# Patient Record
Sex: Male | Born: 1935
Health system: Southern US, Community
[De-identification: ages and names within clinical notes are randomized; demographics above are authoritative.]

## PROBLEM LIST (undated history)

## (undated) DIAGNOSIS — N210 Calculus in bladder: Secondary | ICD-10-CM

## (undated) DIAGNOSIS — R972 Elevated prostate specific antigen [PSA]: Secondary | ICD-10-CM

## (undated) DIAGNOSIS — M1990 Unspecified osteoarthritis, unspecified site: Secondary | ICD-10-CM

## (undated) DIAGNOSIS — I6529 Occlusion and stenosis of unspecified carotid artery: Secondary | ICD-10-CM

## (undated) DIAGNOSIS — R310 Gross hematuria: Secondary | ICD-10-CM

## (undated) DIAGNOSIS — I2510 Atherosclerotic heart disease of native coronary artery without angina pectoris: Secondary | ICD-10-CM

## (undated) DIAGNOSIS — N401 Enlarged prostate with lower urinary tract symptoms: Secondary | ICD-10-CM

## (undated) DIAGNOSIS — I499 Cardiac arrhythmia, unspecified: Secondary | ICD-10-CM

## (undated) DIAGNOSIS — E785 Hyperlipidemia, unspecified: Secondary | ICD-10-CM

## (undated) DIAGNOSIS — R350 Frequency of micturition: Secondary | ICD-10-CM

## (undated) DIAGNOSIS — N419 Inflammatory disease of prostate, unspecified: Secondary | ICD-10-CM

## (undated) DIAGNOSIS — K219 Gastro-esophageal reflux disease without esophagitis: Secondary | ICD-10-CM

## (undated) DIAGNOSIS — D3501 Benign neoplasm of right adrenal gland: Secondary | ICD-10-CM

## (undated) DIAGNOSIS — R001 Bradycardia, unspecified: Secondary | ICD-10-CM

## (undated) DIAGNOSIS — R413 Other amnesia: Secondary | ICD-10-CM

## (undated) DIAGNOSIS — I10 Essential (primary) hypertension: Secondary | ICD-10-CM

## (undated) DIAGNOSIS — IMO0001 Reserved for inherently not codable concepts without codable children: Secondary | ICD-10-CM

## (undated) HISTORY — DX: Atherosclerotic heart disease of native coronary artery without angina pectoris: I25.10

## (undated) HISTORY — PX: CAROTID ENDARTERECTOMY: SUR193

## (undated) HISTORY — DX: Calculus in bladder: N21.0

## (undated) HISTORY — DX: Gross hematuria: R31.0

## (undated) HISTORY — DX: Frequency of micturition: R35.0

## (undated) HISTORY — DX: Unspecified osteoarthritis, unspecified site: M19.90

## (undated) HISTORY — DX: Benign prostatic hyperplasia with lower urinary tract symptoms: N40.1

## (undated) HISTORY — DX: Elevated prostate specific antigen (PSA): R97.20

## (undated) HISTORY — DX: Other amnesia: R41.3

## (undated) HISTORY — DX: Bradycardia, unspecified: R00.1

## (undated) HISTORY — DX: Inflammatory disease of prostate, unspecified: N41.9

## (undated) HISTORY — DX: Benign neoplasm of right adrenal gland: D35.01

---

## 2004-11-07 ENCOUNTER — Ambulatory Visit: Payer: Self-pay | Admitting: Gastroenterology

## 2007-12-15 ENCOUNTER — Ambulatory Visit: Payer: Self-pay | Admitting: Gastroenterology

## 2008-01-22 ENCOUNTER — Ambulatory Visit: Payer: Self-pay | Admitting: Internal Medicine

## 2008-01-30 ENCOUNTER — Ambulatory Visit: Payer: Self-pay | Admitting: Vascular Surgery

## 2008-02-24 ENCOUNTER — Ambulatory Visit: Payer: Self-pay | Admitting: Vascular Surgery

## 2008-02-25 ENCOUNTER — Ambulatory Visit: Payer: Self-pay | Admitting: Cardiology

## 2008-03-02 ENCOUNTER — Inpatient Hospital Stay: Payer: Self-pay | Admitting: Vascular Surgery

## 2008-03-04 ENCOUNTER — Emergency Department: Payer: Self-pay | Admitting: Unknown Physician Specialty

## 2010-08-05 ENCOUNTER — Ambulatory Visit: Payer: Self-pay | Admitting: Family Medicine

## 2010-12-05 NOTE — Assessment & Plan Note (Signed)
Summary: FLU SHOT/EVM   Vital Signs:  Patient Profile:   77 Years Old Male Temp:     97.7 degrees F oral                 The patient and/or caregiver has been counseled thoroughly with regard to medications prescribed including dosage, schedule, interactions, rationale for use, and possible side effects and they verbalize understanding.  Diagnoses and expected course of recovery discussed and will return if not improved as expected or if the condition worsens. Patient and/or caregiver verbalized understanding.   Orders Added: 1)  Influenza A (H1N1) adm  fee Medicare/Non Medicare [W2956]   Influenza Vaccine    Vaccine Type: flulaval    Site: right deltoid    Mfr: GlaxoSmithKline    Dose: 0.5 ml    Route: IM    Given by: Providence Crosby LPN    Exp. Date: 03/2011    Lot #: [id number]    VIS given: 05/30/10 version given August 05, 2010.  Flu Vaccine Consent Questions    Do you have a history of severe allergic reactions to this vaccine? no    Any prior history of allergic reactions to egg and/or gelatin? no    Do you have a sensitivity to the preservative Thimersol? no    Do you have a past history of Guillan-Barre Syndrome? no    Do you currently have an acute febrile illness? no    Have you ever had a severe reaction to latex? no    Vaccine information given and explained to patient? yes

## 2011-05-28 ENCOUNTER — Ambulatory Visit: Payer: Self-pay

## 2012-06-04 ENCOUNTER — Ambulatory Visit: Payer: Self-pay | Admitting: Cardiology

## 2012-06-04 DIAGNOSIS — Z9861 Coronary angioplasty status: Secondary | ICD-10-CM | POA: Insufficient documentation

## 2012-10-15 ENCOUNTER — Ambulatory Visit: Payer: Self-pay | Admitting: Ophthalmology

## 2012-10-17 ENCOUNTER — Ambulatory Visit: Payer: Self-pay | Admitting: Specialist

## 2012-10-24 ENCOUNTER — Ambulatory Visit: Payer: Self-pay | Admitting: Ophthalmology

## 2012-11-03 DIAGNOSIS — R350 Frequency of micturition: Secondary | ICD-10-CM | POA: Insufficient documentation

## 2012-11-03 DIAGNOSIS — N419 Inflammatory disease of prostate, unspecified: Secondary | ICD-10-CM | POA: Insufficient documentation

## 2012-11-03 DIAGNOSIS — R972 Elevated prostate specific antigen [PSA]: Secondary | ICD-10-CM | POA: Insufficient documentation

## 2012-11-03 DIAGNOSIS — N411 Chronic prostatitis: Secondary | ICD-10-CM | POA: Insufficient documentation

## 2012-11-03 DIAGNOSIS — R3915 Urgency of urination: Secondary | ICD-10-CM | POA: Insufficient documentation

## 2012-11-03 DIAGNOSIS — N138 Other obstructive and reflux uropathy: Secondary | ICD-10-CM | POA: Insufficient documentation

## 2012-11-11 ENCOUNTER — Ambulatory Visit: Payer: Self-pay | Admitting: Ophthalmology

## 2012-12-09 ENCOUNTER — Ambulatory Visit: Payer: Self-pay | Admitting: Specialist

## 2012-12-24 ENCOUNTER — Ambulatory Visit: Payer: Self-pay | Admitting: Specialist

## 2013-04-15 ENCOUNTER — Other Ambulatory Visit: Payer: Self-pay

## 2013-04-15 NOTE — Telephone Encounter (Signed)
Opened in error

## 2013-07-22 ENCOUNTER — Ambulatory Visit: Payer: Self-pay | Admitting: Gastroenterology

## 2014-02-13 DIAGNOSIS — R0681 Apnea, not elsewhere classified: Secondary | ICD-10-CM | POA: Insufficient documentation

## 2014-02-13 DIAGNOSIS — R42 Dizziness and giddiness: Secondary | ICD-10-CM | POA: Insufficient documentation

## 2014-02-13 DIAGNOSIS — I10 Essential (primary) hypertension: Secondary | ICD-10-CM | POA: Insufficient documentation

## 2014-02-13 DIAGNOSIS — R001 Bradycardia, unspecified: Secondary | ICD-10-CM | POA: Insufficient documentation

## 2014-02-13 DIAGNOSIS — I6529 Occlusion and stenosis of unspecified carotid artery: Secondary | ICD-10-CM | POA: Insufficient documentation

## 2014-02-13 DIAGNOSIS — R079 Chest pain, unspecified: Secondary | ICD-10-CM | POA: Insufficient documentation

## 2014-05-24 ENCOUNTER — Ambulatory Visit: Payer: Self-pay | Admitting: Internal Medicine

## 2014-05-24 LAB — CBC CANCER CENTER
Bands: 3 %
COMMENT - H1-COM1: NORMAL
Eosinophil: 4 %
HCT: 34.4 % — ABNORMAL LOW (ref 40.0–52.0)
HGB: 11.2 g/dL — AB (ref 13.0–18.0)
Lymphocytes: 24 %
MCH: 29.5 pg (ref 26.0–34.0)
MCHC: 32.7 g/dL (ref 32.0–36.0)
MCV: 90 fL (ref 80–100)
MONOS PCT: 10 %
PLATELETS: 233 x10 3/mm (ref 150–440)
RBC: 3.8 10*6/uL — ABNORMAL LOW (ref 4.40–5.90)
RDW: 13.8 % (ref 11.5–14.5)
SEGMENTED NEUTROPHILS: 59 %
WBC: 5.9 x10 3/mm (ref 3.8–10.6)

## 2014-05-24 LAB — IRON AND TIBC
IRON SATURATION: 38 %
IRON: 98 ug/dL (ref 65–175)
Iron Bind.Cap.(Total): 258 ug/dL (ref 250–450)
Unbound Iron-Bind.Cap.: 160 ug/dL

## 2014-05-24 LAB — LACTATE DEHYDROGENASE: LDH: 154 U/L (ref 85–241)

## 2014-05-24 LAB — FERRITIN: Ferritin (ARMC): 191 ng/mL (ref 8–388)

## 2014-05-24 LAB — RETICULOCYTES
Absolute Retic Count: 0.0562 10*6/uL (ref 0.019–0.186)
RETICULOCYTE: 1.48 % (ref 0.4–3.1)

## 2014-05-24 LAB — FOLATE: Folic Acid: 16.9 ng/mL (ref 3.1–100.0)

## 2014-05-26 LAB — URINE IEP, RANDOM

## 2014-05-26 LAB — PROT IMMUNOELECTROPHORES(ARMC)

## 2014-06-05 ENCOUNTER — Ambulatory Visit: Payer: Self-pay | Admitting: Internal Medicine

## 2014-10-08 ENCOUNTER — Ambulatory Visit: Payer: Self-pay | Admitting: Urology

## 2014-10-15 ENCOUNTER — Ambulatory Visit: Payer: Self-pay

## 2015-01-13 DIAGNOSIS — N200 Calculus of kidney: Secondary | ICD-10-CM | POA: Diagnosis not present

## 2015-01-13 DIAGNOSIS — R310 Gross hematuria: Secondary | ICD-10-CM | POA: Diagnosis not present

## 2015-02-11 DIAGNOSIS — N401 Enlarged prostate with lower urinary tract symptoms: Secondary | ICD-10-CM | POA: Diagnosis not present

## 2015-02-11 DIAGNOSIS — R972 Elevated prostate specific antigen [PSA]: Secondary | ICD-10-CM | POA: Diagnosis not present

## 2015-02-11 DIAGNOSIS — D3501 Benign neoplasm of right adrenal gland: Secondary | ICD-10-CM | POA: Diagnosis not present

## 2015-02-11 DIAGNOSIS — R310 Gross hematuria: Secondary | ICD-10-CM | POA: Diagnosis not present

## 2015-02-22 NOTE — Op Note (Signed)
PATIENT NAME:  Charles Sims, Charles Sims MR#:  [id number] DATE OF BIRTH:  10/04/1930  DATE OF PROCEDURE:  10/24/2012  PREOPERATIVE DIAGNOSIS: Visually significant cataract of the right eye.   POSTOPERATIVE DIAGNOSIS: Visually significant cataract of the right eye.   OPERATIVE PROCEDURE: Cataract extraction by phacoemulsification with implant of intraocular lens to right eye.   SURGEON: Birder Robson, MD.   ANESTHESIA:  1. Managed anesthesia care.  2. Topical tetracaine drops followed by 2% Xylocaine jelly applied in the preoperative holding area.   COMPLICATIONS: None.   TECHNIQUE:  Stop and chop.  DESCRIPTION OF PROCEDURE: The patient was examined and consented in the preoperative holding area where the aforementioned topical anesthesia was applied to the right eye and then brought back to the Operating Room where the right eye was prepped and draped in the usual sterile ophthalmic fashion and a lid speculum was placed. A paracentesis was created with the side port blade and the anterior chamber was filled with viscoelastic. A near clear corneal incision was performed with the steel keratome. A continuous curvilinear capsulorrhexis was performed with a cystotome followed by the capsulorrhexis forceps. Hydrodissection and hydrodelineation were carried out with BSS on a blunt cannula. The lens was removed in a stop and chop technique and the remaining cortical material was removed with the irrigation-aspiration handpiece. The capsular bag was inflated with viscoelastic and the Tecnis ZCB00 24.0-diopter lens, serial number [id number] was placed in the capsular bag without complication. The remaining viscoelastic was removed from the eye with the irrigation-aspiration handpiece. The wounds were hydrated. The anterior chamber was flushed with Miostat and the eye was inflated to physiologic pressure. The wounds were found to be water tight. The eye was dressed with Vigamox. The patient was given protective  glasses to wear throughout the day and a shield with which to sleep tonight. The patient was also given drops with which to begin a drop regimen today and will follow-up with me in one day.  ____________________________ Livingston Diones. Djuan Talton, MD wlp:sb D: 10/24/2012 10:43:25 ET T: 10/24/2012 15:48:44 ET JOB#: [id number]  cc: Chakira Jachim L. Marris Frontera, MD, <Dictator> Livingston Diones Maninder Deboer MD ELECTRONICALLY SIGNED 11/04/2012 13:42

## 2015-02-25 NOTE — Op Note (Signed)
PATIENT NAME:  Charles Sims, Charles Sims MR#:  [id number] DATE OF BIRTH:  February 12, 1930  DATE OF PROCEDURE:  11/11/2012  PREOPERATIVE DIAGNOSIS: Visually significant cataract of the left eye.   POSTOPERATIVE DIAGNOSIS: Visually significant cataract of the left eye.   OPERATIVE PROCEDURE: Cataract extraction by phacoemulsification with implant of intraocular lens to left eye.   SURGEON: Birder Robson, MD.   ANESTHESIA:  1. Managed anesthesia care.  2. Topical tetracaine drops followed by 2% Xylocaine jelly applied in the preoperative holding area.   COMPLICATIONS: None.   TECHNIQUE:  Stop and Chop.  DESCRIPTION OF PROCEDURE: The patient was examined and consented in the preoperative holding area where the aforementioned topical anesthesia was applied to the left eye and then brought back to the Operating Room where the left eye was prepped and draped in the usual sterile ophthalmic fashion and a lid speculum was placed. A paracentesis was created with the side port blade and the anterior chamber was filled with viscoelastic. A near clear corneal incision was performed with the steel keratome. A continuous curvilinear capsulorrhexis was performed with a cystotome followed by the capsulorrhexis forceps. Hydrodissection and hydrodelineation were carried out with BSS on a blunt cannula. The lens was removed in a stop and chop technique and the remaining cortical material was removed with the irrigation-aspiration handpiece. The capsular bag was inflated with viscoelastic and the Technus ZCB00 24.0-diopter lens, serial number [id number] was placed in the capsular bag without complication. The remaining viscoelastic was removed from the eye with the irrigation-aspiration handpiece. The wounds were hydrated. The anterior chamber was flushed with Miostat and the eye was inflated to physiologic pressure. 0.1 mL of cefuroxime concentration 1 mg/mL was placed in the anterior chamber. The wounds were found to be water  tight. The eye was dressed with Vigamox. The patient was given protective glasses to wear throughout the day and a shield with which to sleep tonight. The patient was also given drops with which to begin a drop regimen today and will follow-up with me in 1day.   ____________________________ Livingston Diones. Yuliet Needs, MD wlp:cs D: 11/11/2012 12:03:20 ET T: 11/11/2012 18:28:34 ET JOB#: [id number]  cc: Rhia Blatchford L. Dreyton Roessner, MD, <Dictator> Livingston Diones Dion Sibal MD ELECTRONICALLY SIGNED 11/13/2012 14:37

## 2015-02-25 NOTE — Op Note (Signed)
PATIENT NAME:  Charles Sims, Charles Sims MR#:  [id number] DATE OF BIRTH:  December 27, 1929  DATE OF PROCEDURE:  12/24/2012  PREOPERATIVE DIAGNOSIS:  1.  Macerated severe tear of the right posterior medial meniscus.  2.  Moderate synovitis, right knee.  3.  Grade II chondromalacia, medial femoral condyle, medial tibia and patellofemoral region.   PROCEDURES: 1.  Arthroscopic partial right medial meniscectomy.  2.  Arthroscopic chondroplasty of the medial femur, tibia and patellofemoral region.  3.  Arthroscopic partial synovectomy.   SURGEON:  Park Breed, M.D.   ANESTHESIA:  General LMA.   COMPLICATIONS:  None.   DRAINS:  None.   OPERATIVE FINDINGS:  The patient had a severely macerated, torn tear of the posterior right medial meniscus. He had grade II chondromalacia on the medial femoral condyle and tibia. The intracondylar notch was normal, and the lateral compartment showed good articular surfaces with minimal fraying of the lateral meniscus medially on its inner edge. Medial patellofemoral joint showed grade II chondromalacia. There were no loose bodies. There was some mild synovitis proximally.   OPERATIVE PROCEDURE: The patient was brought to the operating room where he underwent satisfactory general LMA anesthesia in the supine position.  The leg was prepped and draped in a sterile fashion. Arthroscopy carried out through standard portals. The above findings were encountered. The posterior horn of the medial meniscus was debrided with basket forceps, motorized resector and ArthroCare wand back to healthy stable tissue.  The synovitis was removed for visualization purposes. Chondromalacia on the medial femoral condyle, tibia and patellofemoral region was debrided with a motorized whisker blade and was gently cauterized with the ArthroCare wand at the lowest setting. The lateral compartment had minimal changes, and it was not treated. The pump pressure was decreased to allow for coagulation of  bleeders. After thorough irrigation, the stab wounds were closed with 4-0 nylon suture.  Marcaine 0.5% with epinephrine and morphine was placed in the joint.  A dry, sterile compression dressing was applied. The tourniquet was not used. The patient was awakened and taken to recovery in good condition.     ____________________________ Park Breed, MD hem:dm D: 12/24/2012 10:29:07 ET T: 12/24/2012 10:41:20 ET JOB#: [id number]  cc: Park Breed, MD, <Dictator> Park Breed MD ELECTRONICALLY SIGNED 12/24/2012 12:11

## 2015-04-01 DIAGNOSIS — R972 Elevated prostate specific antigen [PSA]: Secondary | ICD-10-CM | POA: Diagnosis not present

## 2015-04-01 DIAGNOSIS — N210 Calculus in bladder: Secondary | ICD-10-CM | POA: Diagnosis not present

## 2015-04-01 DIAGNOSIS — N401 Enlarged prostate with lower urinary tract symptoms: Secondary | ICD-10-CM | POA: Diagnosis not present

## 2015-04-01 DIAGNOSIS — R310 Gross hematuria: Secondary | ICD-10-CM | POA: Diagnosis not present

## 2015-04-05 ENCOUNTER — Encounter
Admission: RE | Admit: 2015-04-05 | Discharge: 2015-04-05 | Disposition: A | Discharge status: Home / Self Care | Payer: Commercial Managed Care - HMO | Source: Ambulatory Visit | Attending: Urology | Admitting: Urology

## 2015-04-05 DIAGNOSIS — Z01810 Encounter for preprocedural cardiovascular examination: Secondary | ICD-10-CM | POA: Diagnosis not present

## 2015-04-05 DIAGNOSIS — N210 Calculus in bladder: Secondary | ICD-10-CM | POA: Insufficient documentation

## 2015-04-05 DIAGNOSIS — I2510 Atherosclerotic heart disease of native coronary artery without angina pectoris: Secondary | ICD-10-CM | POA: Diagnosis not present

## 2015-04-05 DIAGNOSIS — Z01812 Encounter for preprocedural laboratory examination: Secondary | ICD-10-CM | POA: Diagnosis not present

## 2015-04-05 HISTORY — DX: Reserved for inherently not codable concepts without codable children: IMO0001

## 2015-04-05 HISTORY — DX: Hyperlipidemia, unspecified: E78.5

## 2015-04-05 HISTORY — DX: Occlusion and stenosis of unspecified carotid artery: I65.29

## 2015-04-05 HISTORY — DX: Essential (primary) hypertension: I10

## 2015-04-05 HISTORY — DX: Gastro-esophageal reflux disease without esophagitis: K21.9

## 2015-04-05 HISTORY — DX: Atherosclerotic heart disease of native coronary artery without angina pectoris: I25.10

## 2015-04-05 HISTORY — DX: Cardiac arrhythmia, unspecified: I49.9

## 2015-04-05 LAB — URINALYSIS COMPLETE WITH MICROSCOPIC (ARMC ONLY)
Bacteria, UA: NONE SEEN
Bilirubin Urine: NEGATIVE
Glucose, UA: NEGATIVE mg/dL
Hgb urine dipstick: NEGATIVE
Ketones, ur: NEGATIVE mg/dL
Nitrite: NEGATIVE
Protein, ur: NEGATIVE mg/dL
Specific Gravity, Urine: 1.016 (ref 1.005–1.030)
pH: 6 (ref 5.0–8.0)

## 2015-04-05 LAB — BASIC METABOLIC PANEL
ANION GAP: 9 (ref 5–15)
BUN: 27 mg/dL — ABNORMAL HIGH (ref 6–20)
CHLORIDE: 106 mmol/L (ref 101–111)
CO2: 28 mmol/L (ref 22–32)
Calcium: 9.1 mg/dL (ref 8.9–10.3)
Creatinine, Ser: 1.26 mg/dL — ABNORMAL HIGH (ref 0.61–1.24)
GFR, EST AFRICAN AMERICAN: 59 mL/min — AB (ref >60)
GFR, EST NON AFRICAN AMERICAN: 51 mL/min — AB (ref >60)
Glucose, Bld: 95 mg/dL (ref 65–99)
POTASSIUM: 4.4 mmol/L (ref 3.5–5.1)
SODIUM: 143 mmol/L (ref 135–145)

## 2015-04-05 LAB — CBC
HEMATOCRIT: 33.7 % — AB (ref 40.0–52.0)
Hemoglobin: 11.4 g/dL — ABNORMAL LOW (ref 13.0–18.0)
MCH: 30.3 pg (ref 26.0–34.0)
MCHC: 33.7 g/dL (ref 32.0–36.0)
MCV: 89.9 fL (ref 80.0–100.0)
PLATELETS: 203 10*3/uL (ref 150–440)
RBC: 3.75 MIL/uL — ABNORMAL LOW (ref 4.40–5.90)
RDW: 13.9 % (ref 11.5–14.5)
WBC: 5 10*3/uL (ref 3.8–10.6)

## 2015-04-05 NOTE — Patient Instructions (Addendum)
  Your procedure is scheduled on: Wednesday 04/13/2015 Report to Day Surgery. Medical mall Entrance To find out your arrival time please call [phone] between 1PM - 3PM on Tuesday 04/12/2015.  Remember: Instructions that are not followed completely may result in serious medical risk, up to and including death, or upon the discretion of your surgeon and anesthesiologist your surgery may need to be rescheduled.    __x__ 1. Do not eat food or drink liquids after midnight. No gum chewing or hard candies.     __x__ 2. No Alcohol for 24 hours before or after surgery.   ____ 3. Bring all medications with you on the day of surgery if instructed.    __x__ 4. Notify your doctor if there is any change in your medical condition     (cold, fever, infections).     Do not wear jewelry, make-up, hairpins, clips or nail polish.  Do not wear lotions, powders, or perfumes. You may wear deodorant.  Do not shave 48 hours prior to surgery. Men may shave face and neck.  Do not bring valuables to the hospital.    [facility] is not responsible for any belongings or valuables.               Contacts, dentures or bridgework may not be worn into surgery.  Leave your suitcase in the car. After surgery it may be brought to your room.  For patients admitted to the hospital, discharge time is determined by your  treatment team.   Patients discharged the day of surgery will not be allowed to drive home.   Please read over the following fact sheets that you were given:   Surgical Site Infection Prevention   __x__ Take these medicines the morning of surgery with A SIP OF WATER:    1.  Omeprazole  2.   3.   4.  5.  6.  ____ Fleet Enema (as directed)   ____ Use CHG Soap as directed  ____ Use inhalers on the day of surgery  ____ Stop metformin 2 days prior to surgery    ____ Take 1/2 of usual insulin dose the night before surgery and none on the morning of surgery.   ____ Stop Coumadin/Plavix/aspirin  on   ____ Stop Anti-inflammatories on    ____ Stop supplements until after surgery.    ____ Bring C-Pap to the hospital.

## 2015-04-07 ENCOUNTER — Telehealth: Payer: Self-pay | Admitting: *Deleted

## 2015-04-07 DIAGNOSIS — N390 Urinary tract infection, site not specified: Secondary | ICD-10-CM

## 2015-04-07 NOTE — Telephone Encounter (Signed)
-----   Message from Hollice Espy, MD sent at 04/07/2015 12:27 PM EDT ----- Regarding: result notification- UTI This patient has a positive urine culture preop. Please call and inform him. He needs to be treated with Macrobid 100 mg twice a day 10 days.  Hollice Espy, MD   ----- Message -----    From: [facility] [place] Interface    Sent: 04/06/2015   2:23 PM      To: Hollice Espy, MD

## 2015-04-07 NOTE — Telephone Encounter (Signed)
Spoke to patient and he states he picked up this med yesterday. Pharmacy is [address] [facility].

## 2015-04-12 LAB — URINE CULTURE

## 2015-04-13 ENCOUNTER — Encounter
Admission: RE | Disposition: A | Discharge status: Home / Self Care | Payer: Self-pay | Source: Ambulatory Visit | Attending: Urology

## 2015-04-13 ENCOUNTER — Ambulatory Visit: Payer: Commercial Managed Care - HMO | Admitting: Anesthesiology

## 2015-04-13 ENCOUNTER — Ambulatory Visit
Admission: RE | Admit: 2015-04-13 | Discharge: 2015-04-13 | Disposition: A | Discharge status: Home / Self Care | Payer: Commercial Managed Care - HMO | Source: Ambulatory Visit | Attending: Urology | Admitting: Urology

## 2015-04-13 ENCOUNTER — Encounter: Payer: Self-pay | Admitting: *Deleted

## 2015-04-13 DIAGNOSIS — I861 Scrotal varices: Secondary | ICD-10-CM | POA: Diagnosis not present

## 2015-04-13 DIAGNOSIS — N401 Enlarged prostate with lower urinary tract symptoms: Secondary | ICD-10-CM | POA: Diagnosis not present

## 2015-04-13 DIAGNOSIS — E785 Hyperlipidemia, unspecified: Secondary | ICD-10-CM | POA: Insufficient documentation

## 2015-04-13 DIAGNOSIS — B952 Enterococcus as the cause of diseases classified elsewhere: Secondary | ICD-10-CM | POA: Insufficient documentation

## 2015-04-13 DIAGNOSIS — K219 Gastro-esophageal reflux disease without esophagitis: Secondary | ICD-10-CM | POA: Insufficient documentation

## 2015-04-13 DIAGNOSIS — R001 Bradycardia, unspecified: Secondary | ICD-10-CM | POA: Diagnosis not present

## 2015-04-13 DIAGNOSIS — N419 Inflammatory disease of prostate, unspecified: Secondary | ICD-10-CM | POA: Diagnosis not present

## 2015-04-13 DIAGNOSIS — N210 Calculus in bladder: Secondary | ICD-10-CM | POA: Diagnosis not present

## 2015-04-13 DIAGNOSIS — M549 Dorsalgia, unspecified: Secondary | ICD-10-CM | POA: Insufficient documentation

## 2015-04-13 DIAGNOSIS — R972 Elevated prostate specific antigen [PSA]: Secondary | ICD-10-CM | POA: Diagnosis not present

## 2015-04-13 DIAGNOSIS — R0602 Shortness of breath: Secondary | ICD-10-CM | POA: Diagnosis not present

## 2015-04-13 DIAGNOSIS — R350 Frequency of micturition: Secondary | ICD-10-CM | POA: Diagnosis not present

## 2015-04-13 DIAGNOSIS — I2510 Atherosclerotic heart disease of native coronary artery without angina pectoris: Secondary | ICD-10-CM | POA: Diagnosis not present

## 2015-04-13 DIAGNOSIS — R0600 Dyspnea, unspecified: Secondary | ICD-10-CM | POA: Diagnosis not present

## 2015-04-13 DIAGNOSIS — Z87891 Personal history of nicotine dependence: Secondary | ICD-10-CM | POA: Diagnosis not present

## 2015-04-13 DIAGNOSIS — R42 Dizziness and giddiness: Secondary | ICD-10-CM | POA: Insufficient documentation

## 2015-04-13 DIAGNOSIS — R3915 Urgency of urination: Secondary | ICD-10-CM | POA: Diagnosis not present

## 2015-04-13 DIAGNOSIS — I6522 Occlusion and stenosis of left carotid artery: Secondary | ICD-10-CM | POA: Diagnosis not present

## 2015-04-13 DIAGNOSIS — R310 Gross hematuria: Secondary | ICD-10-CM | POA: Insufficient documentation

## 2015-04-13 DIAGNOSIS — R319 Hematuria, unspecified: Secondary | ICD-10-CM | POA: Diagnosis present

## 2015-04-13 DIAGNOSIS — I10 Essential (primary) hypertension: Secondary | ICD-10-CM | POA: Diagnosis not present

## 2015-04-13 DIAGNOSIS — Z888 Allergy status to other drugs, medicaments and biological substances status: Secondary | ICD-10-CM | POA: Diagnosis not present

## 2015-04-13 DIAGNOSIS — N400 Enlarged prostate without lower urinary tract symptoms: Secondary | ICD-10-CM | POA: Diagnosis present

## 2015-04-13 DIAGNOSIS — N390 Urinary tract infection, site not specified: Secondary | ICD-10-CM | POA: Diagnosis not present

## 2015-04-13 DIAGNOSIS — N433 Hydrocele, unspecified: Secondary | ICD-10-CM | POA: Insufficient documentation

## 2015-04-13 DIAGNOSIS — R079 Chest pain, unspecified: Secondary | ICD-10-CM | POA: Diagnosis not present

## 2015-04-13 HISTORY — PX: CYSTOSCOPY WITH LITHOLAPAXY: SHX1425

## 2015-04-13 SURGERY — CYSTOSCOPY, WITH BLADDER CALCULUS LITHOLAPAXY
Anesthesia: General | Wound class: Clean Contaminated

## 2015-04-13 MED ORDER — EPHEDRINE SULFATE 50 MG/ML IJ SOLN
INTRAMUSCULAR | Status: DC | PRN
  Administered 2015-04-13: 10 mg via INTRAVENOUS

## 2015-04-13 MED ORDER — FENTANYL CITRATE (PF) 100 MCG/2ML IJ SOLN
INTRAMUSCULAR | Status: DC | PRN
  Administered 2015-04-13 (×2): 25 ug via INTRAVENOUS

## 2015-04-13 MED ORDER — HYDROCODONE-ACETAMINOPHEN 5-325 MG PO TABS: 1.0000 | ORAL_TABLET | Freq: Four times a day (QID) | ORAL | Status: DC | PRN

## 2015-04-13 MED ORDER — HYDROMORPHONE HCL 1 MG/ML IJ SOLN: 0.2500 mg | INTRAMUSCULAR | Status: DC | PRN

## 2015-04-13 MED ORDER — DEXAMETHASONE SODIUM PHOSPHATE 4 MG/ML IJ SOLN
8.0000 mg | Freq: Once | INTRAMUSCULAR | Status: DC | PRN
  Filled 2015-04-13: qty 2

## 2015-04-13 MED ORDER — LACTATED RINGERS IV SOLN
INTRAVENOUS | Status: DC | PRN
  Administered 2015-04-13: 13:00:00 via INTRAVENOUS

## 2015-04-13 MED ORDER — PHENYLEPHRINE HCL 10 MG/ML IJ SOLN
INTRAMUSCULAR | Status: DC | PRN
  Administered 2015-04-13: 50 ug via INTRAVENOUS

## 2015-04-13 MED ORDER — FENTANYL CITRATE (PF) 100 MCG/2ML IJ SOLN
25.0000 ug | INTRAMUSCULAR | Status: DC | PRN
  Administered 2015-04-13 (×2): 25 ug via INTRAVENOUS

## 2015-04-13 MED ORDER — PHENAZOPYRIDINE HCL 200 MG PO TABS: 200.0000 mg | ORAL_TABLET | Freq: Three times a day (TID) | ORAL | Status: DC | PRN

## 2015-04-13 MED ORDER — FENTANYL CITRATE (PF) 100 MCG/2ML IJ SOLN
INTRAMUSCULAR | Status: AC
  Administered 2015-04-13: 25 ug
  Filled 2015-04-13: qty 2

## 2015-04-13 MED ORDER — FUROSEMIDE 10 MG/ML IJ SOLN
INTRAMUSCULAR | Status: DC | PRN
  Administered 2015-04-13: 10 mg via INTRAMUSCULAR

## 2015-04-13 MED ORDER — LEVOFLOXACIN IN D5W 500 MG/100ML IV SOLN
INTRAVENOUS | Status: AC
  Administered 2015-04-13: 12:00:00
  Filled 2015-04-13: qty 100

## 2015-04-13 MED ORDER — PROPOFOL 10 MG/ML IV BOLUS
INTRAVENOUS | Status: DC | PRN
  Administered 2015-04-13: 120 mg via INTRAVENOUS
  Administered 2015-04-13: 50 mg via INTRAVENOUS
  Administered 2015-04-13: 30 mg via INTRAVENOUS

## 2015-04-13 MED ORDER — LIDOCAINE HCL (CARDIAC) 20 MG/ML IV SOLN
INTRAVENOUS | Status: DC | PRN
Start: 2015-04-13 — End: 2015-04-13
  Administered 2015-04-13: 60 mg via INTRAVENOUS

## 2015-04-13 SURGICAL SUPPLY — 17 items
BAG DRAIN CYSTO-URO LG1000N (MISCELLANEOUS) ×3 IMPLANT
BASKET ZERO TIP 1.9FR (BASKET) IMPLANT
FEE TECHNICIAN ONLY PER HOUR (MISCELLANEOUS) IMPLANT
GLOVE BIO SURGEON STRL SZ 6.5 (GLOVE) ×2 IMPLANT
GLOVE BIO SURGEON STRL SZ7 (GLOVE) ×6 IMPLANT
GLOVE BIO SURGEONS STRL SZ 6.5 (GLOVE) ×1
GOWN STRL REUS W/ TWL LRG LVL3 (GOWN DISPOSABLE) ×2 IMPLANT
GOWN STRL REUS W/TWL LRG LVL3 (GOWN DISPOSABLE) ×4
KIT RM TURNOVER CYSTO AR (KITS) ×3 IMPLANT
LASER HOLMIUM SU 200UM (MISCELLANEOUS) ×3 IMPLANT
LASER HOLMIUM SU 940UM (MISCELLANEOUS) ×3 IMPLANT
PACK CYSTO AR (MISCELLANEOUS) ×3 IMPLANT
PREP PVP WINGED SPONGE (MISCELLANEOUS) ×3 IMPLANT
SET IRRIG Y TYPE TUR BLADDER L (SET/KITS/TRAYS/PACK) ×3 IMPLANT
SYRINGE IRR TOOMEY STRL 70CC (SYRINGE) ×3 IMPLANT
WATER STERILE IRR 1000ML POUR (IV SOLUTION) ×3 IMPLANT
WATER STERILE IRR 3000ML UROMA (IV SOLUTION) ×6 IMPLANT

## 2015-04-13 NOTE — Transfer of Care (Signed)
Immediate Anesthesia Transfer of Care Note  Patient: Charles Sims  Procedure(s) Performed: Procedure(s): CYSTOSCOPY WITH LITHOLAPAXY (N/A)  Patient Location: PACU  Anesthesia Type:General  Level of Consciousness: sedated  Airway & Oxygen Therapy: Patient Spontanous Breathing and Patient connected to face mask oxygen  Post-op Assessment: Report given to RN  Post vital signs: Reviewed and stable  Last Vitals:  Filed Vitals:   04/13/15 1324  BP: 127/83  Pulse: 81  Temp: 36.1 C  Resp: 18    Complications: No apparent anesthesia complications

## 2015-04-13 NOTE — Anesthesia Postprocedure Evaluation (Signed)
  Anesthesia Post-op Note  Patient: Charles Sims  Procedure(s) Performed: Procedure(s): CYSTOSCOPY WITH LITHOLAPAXY (N/A)  Anesthesia type:General  Patient location: PACU  Post pain: Pain level controlled  Post assessment: Post-op Vital signs reviewed, Patient's Cardiovascular Status Stable, Respiratory Function Stable, Patent Airway and No signs of Nausea or vomiting  Post vital signs: Reviewed and stable  Last Vitals:  Filed Vitals:   04/13/15 1147  BP: 186/71  Pulse: 57  Temp: 36.6 C  Resp: 18    Level of consciousness: awake, alert  and patient cooperative  Complications: No apparent anesthesia complications

## 2015-04-13 NOTE — Brief Op Note (Signed)
04/13/2015  1:28 PM  PATIENT:  Charles Sims  81 y.o. male  PRE-OPERATIVE DIAGNOSIS:  BLADDER STONE, BPH, hematuria  POST-OPERATIVE DIAGNOSIS:  Same as above  PROCEDURE:  Procedure(s): CYSTOSCOPY WITH LITHOLAPAXY (N/A)  SURGEON:  Surgeon(s) and Role:    * Hollice Espy, MD - Primary  ASSISTANTS: none   ANESTHESIA:   general  EBL:  Total I/O In: 600 [I.V.:600] Out: -   Drains: none  Specimen: bladder stone fragements (not sent for analysis)  COUNTS CORRECT: YES  PLAN OF CARE: Discharge to home after PACU  PATIENT DISPOSITION:  PACU - hemodynamically stable.

## 2015-04-13 NOTE — Anesthesia Preprocedure Evaluation (Signed)
Anesthesia Evaluation  Patient identified by MRN, date of birth, ID band Patient awake    Reviewed: Allergy & Precautions, NPO status , Patient's Chart, lab work & pertinent test results  Airway Mallampati: II  TM Distance: >3 FB Neck ROM: Limited    Dental  (+) Teeth Intact   Pulmonary former smoker,    Pulmonary exam normal       Cardiovascular Exercise Tolerance: Poor hypertension, Normal cardiovascular exam    Neuro/Psych    GI/Hepatic GERD-  Medicated and Controlled,  Endo/Other    Renal/GU      Musculoskeletal   Abdominal Normal abdominal exam  (+)   Peds  Hematology   Anesthesia Other Findings   Reproductive/Obstetrics                             Anesthesia Physical Anesthesia Plan  ASA: III  Anesthesia Plan: General   Post-op Pain Management:    Induction: Intravenous  Airway Management Planned: LMA  Additional Equipment:   Intra-op Plan:   Post-operative Plan: Extubation in OR  Informed Consent: I have reviewed the patients History and Physical, chart, labs and discussed the procedure including the risks, benefits and alternatives for the proposed anesthesia with the patient or authorized representative who has indicated his/her understanding and acceptance.     Plan Discussed with: CRNA  Anesthesia Plan Comments:         Anesthesia Quick Evaluation

## 2015-04-13 NOTE — Anesthesia Procedure Notes (Signed)
Procedure Name: LMA Insertion Date/Time: 04/13/2015 12:39 PM Performed by: Dionne Bucy Pre-anesthesia Checklist: Patient identified Patient Re-evaluated:Patient Re-evaluated prior to inductionOxygen Delivery Method: Circle system utilized Preoxygenation: Pre-oxygenation with 100% oxygen Intubation Type: IV induction Ventilation: Mask ventilation without difficulty LMA: LMA inserted LMA Size: 5.0 Number of attempts: 1 Placement Confirmation: positive ETCO2 Tube secured with: Tape Dental Injury: Teeth and Oropharynx as per pre-operative assessment

## 2015-04-13 NOTE — Op Note (Signed)
Date of procedure: 04/13/2015  Preoperative diagnosis:  1. Bladder stones 2. BPH 3. Hematuria  Postoperative diagnosis:  1. same  Procedure: 1. Cystoscopy 2. Cystolitholapaxy  Surgeon: Hollice Espy, MD  Anesthesia: General  Complications: None  Intraoperative findings:   EBL: minimal    Specimens: Stone fragments (not sent for analysis)   Drains: None  Indication: Charles Sims is a 81 y.o. patient with a history of BPH, gross hematuria, and bladder stones.  After reviewing the management options for treatment, he elected to proceed with the above surgical procedure(s). We have discussed the potential benefits and risks of the procedure, side effects of the proposed treatment, the likelihood of the patient achieving the goals of the procedure, and any potential problems that might occur during the procedure or recuperation. Informed consent has been obtained.  Of note, he was treated preoperatively appropriately for an enterococcus UTI.  Description of procedure:  The patient was taken to the operating room and general anesthesia was induced.  The patient was placed in the dorsal lithotomy position, prepped and draped in the usual sterile fashion, and preoperative antibiotics were administered. A preoperative time-out was performed.   At this point time, a rigid 20 French cystoscope was advanced per urethra into the bladder. Of note, his prostate was noted to be markedly enlarged with trilobar coaptation, hypervascularity with mild bleeding with instrumentation, an elevated bladder neck with a median lobe component. His bladder was moderately trabeculated and 3 proximally 1 cm round bladder stones were identified at the bladder neck. There were no other mucosal lesions within the bladder, no tumors, or ulcerations. Given the somewhat friable prostate and mild amount of bleeding, I did exchange the 22 French cystoscope for a 26 French resectoscope which advanced easily into the  bladder. This allowed for continuous irrigation through the procedure. 500  laser fiber was then brought in and using a total wattage of 57 W, each of the 3 stones were fragmented into very small pieces. These pieces were then irrigated out of the bladder. The trigone was identified and relatively close to the bladder neck.  Each UO was noted to be free of any trauma and clear reflux could be seen from both. The water was turned off and there was no evidence of active bleeding other than some slight oozing from the prostatic urethra. The bladder was then drained and the scope was removed. Patient was administered 10 IV Lasix to help with diuresis and keep his urine relatively clear. He was then reversed from anesthesia, taken to the PACU in stable condition.  Plan: The patient will need to urinate prior to discharge. We will check a bladder scan to ensure that his bladder is empty. If he fails to void, he will be discharged home with a Foley catheter in place.  Hollice Espy, M.D.

## 2015-04-13 NOTE — Discharge Instructions (Addendum)
AMBULATORY SURGERY  DISCHARGE INSTRUCTIONS   1) The drugs that you were given will stay in your system until tomorrow so for the next 24 hours you should not:  A) Drive an automobile B) Make any legal decisions C) Drink any alcoholic beverage   2) You may resume regular meals tomorrow.  Today it is better to start with liquids and gradually work up to solid foods.  You may eat anything you prefer, but it is better to start with liquids, then soup and crackers, and gradually work up to solid foods.   3) Please notify your doctor immediately if you have any unusual bleeding, trouble breathing, redness and pain at the surgery site, drainage, fever, or pain not relieved by medication.   Cystoscopy, Care After Refer to this sheet in the next few weeks. These instructions provide you with information on caring for yourself after your procedure. Your caregiver may also give you more specific instructions. Your treatment has been planned according to current medical practices, but problems sometimes occur. Call your caregiver if you have any problems or questions after your procedure. HOME CARE INSTRUCTIONS  Things you can do to ease any discomfort after your procedure include:  Drinking enough water and fluids to keep your urine clear or pale yellow.  Taking a warm bath to relieve any burning feelings. SEEK IMMEDIATE MEDICAL CARE IF:   You have an increase in blood in your urine.  You notice blood clots in your urine.  You have difficulty passing urine.  You have the chills.  You have abdominal pain.  You have a fever or persistent symptoms for more than 2-3 days.  You have a fever and your symptoms suddenly get worse. MAKE SURE YOU:   Understand these instructions.  Will watch your condition.  Will get help right away if you are not doing well or get worse. Document Released: 05/11/2005 Document Revised: 06/24/2013 Document Reviewed: 04/14/2012 [facility] Patient  Information 2015 [place], [place]. This information is not intended to replace advice given to you by your health care provider. Make sure you discuss any questions you have with your health care provider.

## 2015-04-13 NOTE — H&P (Signed)
Charles Sims 04/01/2015 8:34 AM Location: [facility] Patient #: [id number] DOB: 07/30/1930 Married / Language: Charles Sims / Race: White Male    History of Present Charles Antigua, MD; 04/01/2015 9:43 AM) The patient is a 81 year old male who presents for a recheck of Pre-op visit. The procedure scheduled is a cystolithopaxy on 04/13/2015. The surgeon for the procedure will be Charles Espy, MD. Note for "Pre-op visit": Patient notified of surgery date 04-13-15 and pre-op date 04-05-15@12pm .  81 year old man s/p work up for gross hematuria. CT Urogram performed on 10/08/14 shows evidence of bladder stones, enlarged prostate and probable right adrenal nodule (22 mm). He is status post cystoscopy in 10/2014 which was relatively limited due some prostatic bleeding which was stirred up by the procedure. CT urogram shows bladder stones and a large prostate. We previously discussed formal cystoscopy in the operating room at which time we can treat his bladder stones. BPH surgery was also discussed but he has elected to proceed with treatment of his bladder stone only.  He has had some intermittent episodes of bleeding after riding a lawnmower as well as some occasional dysuria since his last visit.  He denies history of kidney stones or flank pain. He does have a history of rare occasional UTIs.  He is on aspirin 81 mg but no other blood thinners.  He does have a history extensive smoking, 50 years one pack per day, quit approximately 20 years ago.  He does also have a history of BPH and elevated PSA. His most recent PSA was 8.21 on 09/06/2014. Previously followed by Dr. Bernardo Sims and records have been reviewed. He states he is status post 2 negative biopsy in 2010 by Dr. Reece Sims in 2010 at which time his PSA was 4.7 ng/dL on finasteride which was negative for malignancy. His PSA was 5.2 in 04/2014 and 4.7 in 04/2014.  He does have trouble emptying his bladder  occasional difficulty starting his stream. He is not terribly bothered by these symptoms. He currently takes doxazosin 4 mg nightly.  He does have a family history of renal cell carcinoma in both his father and grandfather.  CT Urogram 10/08/14 IMPRESSION: 1. Three bladder calculi are likely responsible for the patient's gross hematuria. 2. Markedly enlarged prostate gland with median lobe hypertrophy and mass effect on the base of the bladder. 3. 22 x 18.5 mm soft tissue mass in the right upper quadrant is most likely an exophytic adrenal gland adenoma. 4. Right-sided hydrocele and varicocele.        Problem List/Past Medical(Charles Sims; 04/01/2015 8:44 AM) Arthritis (716.90  M19.90) GERD (gastroesophageal reflux disease) (530.81  K21.9) Carotid stenosis, left (433.10  I65.22) Urinary frequency (788.41  R35.0) Enlarged prostate with lower urinary tract symptoms (LUTS) (600.01  N40.1) Calcium stone of bladder (594.1  N21.0) Gross hematuria (599.71  R31.0) Adrenal adenoma, right (227.0  D35.01) Elevated PSA (790.93  R97.2) Dizziness (780.4  R42) Bradycardia (427.89  R00.1) HLD (hyperlipidemia) (272.4  E78.5) Urinary urgency (788.63  R39.15) Prostatitis (601.9  N41.9) CAD (coronary artery disease) (414.00  I25.10) HTN (hypertension) (401.9  I10) Chest pain (786.50  R07.9) Dyspnea on effort (786.09  R06.09)    Allergies(Charles Sims; 04/01/2015 8:44 AM) Lisinopril *ANTIHYPERTENSIVES*. Cough.    Family History(Charles Sims; 04/01/2015 8:44 AM) Alzheimer's disease. Mother. Renal Cancer. Father, Paternal Grandfather.    Social History(Charles Sims; 04/01/2015 8:44 AM) Tobacco use. Former smoker. Alcohol use. Non-drinker.    Travel History(Charles Sims; 04/01/2015 8:44 AM) Have you traveled  internationally in the last 21 days?. No.    Medication History(Charles Sims; 04/01/2015 8:45 AM) Doxazosin Mesylate  (4MG  Tablet, Oral) Active. Esomeprazole Magnesium (20MG  Capsule DR, Oral) Active. Losartan Potassium-HCTZ (50-12.5MG  Tablet, Oral) Active. Simvastatin (20MG  Tablet, Oral) Active. Medications Reconciled.    Past Surgical History(Charles Sims; 04/01/2015 8:44 AM) Carotid Artery Surgery - Left    Review of Systems(Charles Sims; 04/01/2015 8:43 AM) General:Present- Fatigue and Weight Loss. Not Present- Chills, Fever and Weight Gain > 10lbs.. Skin:Not Present- Hair Loss, Pruritus and Rash. HEENT:Present- Headache and Sinus Pain. Not Present- Eye Pain, Decreased Hearing, Runny Nose, Snoring and Dry Mucous Membranes. Neck:Not Present- Neck Pain and Swollen Glands. Respiratory:Present- Shortness of Breath. Not Present- Chronic Cough, Difficulty Breathing on Exertion and Wakes up from Sleep Wheezing or Short of Breath. Breast:Not Present- Gynecomastia. Cardiovascular:Not Present- Edema, Palpitations and Shortness of Breath. Gastrointestinal:Present- Constipation, Diarrhea, Heartburn and Nausea. Not Present- Vomiting. Male Genitourinary:Present- Blood in Urine, Change in Urinary Stream, Dysuriaand Urinating at Night. Note:see HPI Musculoskeletal:Present- Back Pain and Joint Pain. Neurological:Present- Dizziness. Not Present- Decreased Memory, Headaches and Seizures. Psychiatric:Not Present- Anxiety and Depression. Endocrine:Not Present- Excessive Sweating, Heat Intolerance and Tired/Sluggish. Hematology:Not Present- Abnormal Bleeding, Anemia, Blood Transfusion and Enlarged Lymph Nodes.    Vitals(Charles Sims; 04/01/2015 8:45 AM) 04/01/2015 8:45 AM Weight: 191.25 lb Height: 68 in Body Surface Area: 2.04 m Body Mass Index: 29.08 kg/m Pulse: 64 (Regular) BP: 173/73 (Sitting, Left Arm, Standard)     Physical [name], MD; 04/01/2015 9:44 AM) The physical exam findings are as follows:   General Mental Status- Note: presents today  to clinic with his wife of more than 81 years General Appearance- Anxious, Well groomed and Consistent with stated age.   Integumentary General Characteristics:Overall examination of the patient's skin reveals - no rashes, no suspicious lesions and no bruises.   Head and Neck Head- normocephalic, atraumatic with no lesions or palpable masses.   Chest and Lung Exam Chest and lung exam reveals - normal excursion with symmetric chest walls and on auscultation, normal breath sounds, no adventitious sounds and normal vocal resonance.   Cardiovascular Cardiovascular examination reveals - normal heart sounds, regular rate and rhythm with no murmurs and no digital clubbing, cyanosis, edema, increased warmth or tenderness.   Abdomen Palpation/Percussion:Palpation and Percussion of the abdomen reveal - Soft, Non Tender and No Rebound tenderness. Bladder- Non-palpable. Kidney (Left):Other Characteristics- Non Tender. Kidney (Right):Other Characteristics- Non Tender.   Neurologic Gait- Normal.    Results(Addisson Frate Erlene Quan, MD; 04/01/2015 10:54 AM) Labs Name Value Range Date URINE CULTURE, COMPREHENSIVE [phone]) Ordered:04/01/2015 URINALYSIS, AUTOMATED W/ MICRO (- [facility] -) (49702) Ordered:04/01/2015   Assessment & Noemi Chapel, MD; 04/01/2015 10:54 AM) Johney Maine hematuria (599.71  R31.0) Impression: 81 year old male with a history of gross hematuria status post cystoscopy (limited due to prostatic bleeding/ stones). CT urogram shows bladder stones and a large prostate. We again discussed today a formal cystoscopy in the operating room at which time we can treat his bladder stones. He was offered BPH procedure but declined. He presents today for preop for cystolitholapaxy. We reviewed the risks and benefits of the procedure including risk of bleeding, infection, damage to surrounding structures, postoperative uri retention or issues, as well as recurrence of  his bladder stones without treating his outlet. He is elected to proceed as planned. - Schedule cystolithalopaxy -ideally hold ASA but will check with PCP/ cardiologist, can perform on ASA if needed -preop urine culture today -  Calcium stone of bladder (594.1  N21.0) Impression: See  above Current Plans l Pt Education - How to access health information online: discussed with patient and provided information. l URINALYSIS, AUTOMATED W/ MICRO (- [facility] -) (81001) l URINE CULTURE, COMPREHENSIVE (14431)  Enlarged prostate with lower urinary tract symptoms (LUTS) (600.01  N40.1) Impression: Baseline LUTs currently on doxazosin 4 mg. PVR previously minimal. Presence of stones consistent with outlet obstruction but with minimal bother. -outlet procedure discussed including discussion of the risks/ benefits, does not want to procede with TURP  Elevated PSA (790.93  R97.2) Impression: Asymmetric DRE at last visit, PSA 8.21 in 09/2014. Review of previous records shows PSA in the 4-5 range s/p negative biopsy in 2010 (Dr. Reece Sims). PSA today 5.4 which is reassuring. -REcommend no further screening   Signed electronically by Charles Espy, MD (04/01/2015 10:55 AM)

## 2015-04-13 NOTE — Interval H&P Note (Signed)
History and Physical Interval Note:  04/13/2015 12:01 PM  Charles Sims  has presented today for surgery, with the diagnosis of BLADDER STONE  The various methods of treatment have been discussed with the patient and family. After consideration of risks, benefits and other options for treatment, the patient has consented to  Procedure(s): CYSTOSCOPY WITH LITHOLAPAXY (N/A) as a surgical intervention .  The patient's history has been reviewed, patient examined, no change in status, stable for surgery.  I have reviewed the patient's chart and labs.  Questions were answered to the patient's satisfaction.    RRR CTAB  Hollice Espy

## 2015-05-16 DIAGNOSIS — M5440 Lumbago with sciatica, unspecified side: Secondary | ICD-10-CM | POA: Diagnosis not present

## 2015-05-16 DIAGNOSIS — M549 Dorsalgia, unspecified: Secondary | ICD-10-CM | POA: Diagnosis not present

## 2015-05-27 ENCOUNTER — Ambulatory Visit (INDEPENDENT_AMBULATORY_CARE_PROVIDER_SITE_OTHER): Payer: Commercial Managed Care - HMO | Admitting: Urology

## 2015-05-27 ENCOUNTER — Encounter: Payer: Self-pay | Admitting: Urology

## 2015-05-27 VITALS — BP 120/61 | HR 67 | Ht 68.0 in | Wt 192.2 lb

## 2015-05-27 DIAGNOSIS — D3501 Benign neoplasm of right adrenal gland: Secondary | ICD-10-CM

## 2015-05-27 DIAGNOSIS — N210 Calculus in bladder: Secondary | ICD-10-CM

## 2015-05-27 DIAGNOSIS — N401 Enlarged prostate with lower urinary tract symptoms: Secondary | ICD-10-CM

## 2015-05-27 DIAGNOSIS — N138 Other obstructive and reflux uropathy: Secondary | ICD-10-CM

## 2015-05-27 DIAGNOSIS — Z87898 Personal history of other specified conditions: Secondary | ICD-10-CM

## 2015-05-27 LAB — URINALYSIS, COMPLETE
Bilirubin, UA: NEGATIVE
Glucose, UA: NEGATIVE
KETONES UA: NEGATIVE
Leukocytes, UA: NEGATIVE
Nitrite, UA: NEGATIVE
Protein, UA: NEGATIVE
RBC, UA: NEGATIVE
Specific Gravity, UA: 1.015 (ref 1.005–1.030)
Urobilinogen, Ur: 0.2 mg/dL (ref 0.2–1.0)
pH, UA: 5 (ref 5.0–7.5)

## 2015-05-27 LAB — MICROSCOPIC EXAMINATION
Bacteria, UA: NONE SEEN
RBC, UA: NONE SEEN /hpf (ref 0–?)

## 2015-05-27 LAB — BLADDER SCAN AMB NON-IMAGING

## 2015-05-27 NOTE — Progress Notes (Signed)
05/27/2015 2:20 PM   Charles Sims 79-01-1930 [id number]  Referring provider: [name], MD [address] [place], [place] [address]  Chief Complaint  Patient presents with  . Routine Post Op    HPI: 81 yo M s/p cystolitholapaxy on 04/13/15.  He was offered an outlet procedure but only wanted his stone treated.  He has done well post op.    He does report that his is voiding well today.  He does feel he voids less frequently that previous.  He does get up 1-3 times nightly depending on what he drinks . He is not terribly bothered by these symptoms. He currently takes doxazosin 4 mg nightly.  He initially presented for work up of gross hematuria. CT Urogram performed on 10/08/14 shows evidence of bladder stones, enlarged prostate and probable right adrenal nodule (22 mm).   He does have a history extensive smoking, 50 years one pack per day, quit approximately 20 years ago.  He does also have a history of BPH and elevated PSA. His most recent PSA was 8.21 on 09/06/2014. Previously followed by Dr. Bernardo Heater and records have been reviewed. He states he is status post 2 negative biopsy in 2010 by Dr. Reece Agar in 2010 at which time his PSA was 4.7 ng/dL on finasteride which was negative for malignancy. His PSA was 5.2 in 04/2014 and 4.7 in 04/2014.    PMH: Past Medical History  Diagnosis Date  . Dysrhythmia     bradycardia  . Hyperlipidemia   . Hypertension   . Shortness of breath dyspnea   . Coronary artery disease   . GERD (gastroesophageal reflux disease)   . Carotid stenosis   . Arthritis     Surgical History: Past Surgical History  Procedure Laterality Date  . Carotid endarterectomy Left   . Cystoscopy with litholapaxy N/A 04/13/2015    Procedure: CYSTOSCOPY WITH LITHOLAPAXY;  Surgeon: Hollice Espy, MD;  Location: [facility];  Service: Urology;  Laterality: N/A;    Home Medications:    Medication List       This list is accurate as of: 79/22/16  2:20 PM.  Always  use your most recent med list.               aspirin EC 81 MG tablet  Take 81 mg by mouth daily.     bismuth subsalicylate 191 YN/82NF suspension  Commonly known as:  PEPTO BISMOL  Take 45 mLs by mouth once.     doxazosin 4 MG tablet  Commonly known as:  CARDURA  Take 4 mg by mouth daily.     loperamide 2 MG tablet  Commonly known as:  IMODIUM A-D  Take 2 mg by mouth daily.     losartan-hydrochlorothiazide 50-12.5 MG per tablet  Commonly known as:  HYZAAR  Take 1 tablet by mouth daily.     meloxicam 15 MG tablet  Commonly known as:  MOBIC     omeprazole 20 MG capsule  Commonly known as:  PRILOSEC  Take 20 mg by mouth daily.     simvastatin 20 MG tablet  Commonly known as:  ZOCOR  Take 20 mg by mouth daily.     traMADol 50 MG tablet  Commonly known as:  ULTRAM        Allergies:  Allergies  Allergen Reactions  . Lisinopril Cough    Family History: History reviewed. No pertinent family history.  Social History:  reports that he quit smoking about 10 years ago. He  has never used smokeless tobacco. He reports that he does not drink alcohol or use illicit drugs.  ROS: UROLOGY Frequent Urination?: Yes Hard to postpone urination?: No Burning/pain with urination?: No Get up at night to urinate?: Yes Leakage of urine?: No Urine stream starts and stops?: No Trouble starting stream?: No Do you have to strain to urinate?: No Blood in urine?: No Urinary tract infection?: No Sexually transmitted disease?: No Injury to kidneys or bladder?: No Painful intercourse?: No Weak stream?: No Erection problems?: No Penile pain?: No  Gastrointestinal Nausea?: No Vomiting?: No Indigestion/heartburn?: No Diarrhea?: No Constipation?: No  Constitutional Fever: No Night sweats?: No Weight loss?: No Fatigue?: No  Skin Skin rash/lesions?: No Itching?: No  Eyes Blurred vision?: No Double vision?: No  Ears/Nose/Throat Sore throat?: No Sinus problems?:  No  Hematologic/Lymphatic Swollen glands?: No Easy bruising?: No  Cardiovascular Leg swelling?: No Chest pain?: No  Respiratory Cough?: No Shortness of breath?: No  Endocrine Excessive thirst?: No  Musculoskeletal Back pain?: Yes Joint pain?: No  Neurological Headaches?: No Dizziness?: No  Psychologic Depression?: No Anxiety?: No  Physical Exam: BP 120/61 mmHg  Pulse 67  Ht 5\' 8"  (1.727 m)  Wt 192 lb 3.2 oz (87.181 kg)  BMI 29.23 kg/m2  Constitutional:  Alert and oriented, No acute distress. HEENT: [place] AT, moist mucus membranes.  Trachea midline, no masses. Cardiovascular: No clubbing, cyanosis, or edema. Respiratory: Normal respiratory effort, no increased work of breathing. GI: Abdomen is soft, nontender, nondistended, no abdominal masses GU: No CVA tenderness.  Skin: No rashes, bruises or suspicious lesions. Neurologic: Grossly intact, no focal deficits, moving all 4 extremities. Psychiatric: Normal mood and affect.  Laboratory Data: Lab Results  Component Value Date   WBC 5.0 04/05/2015   HGB 11.4* 04/05/2015   HCT 33.7* 04/05/2015   MCV 89.9 04/05/2015   PLT 203 04/05/2015    Lab Results  Component Value Date   CREATININE 1.26* 04/05/2015    Urinalysis Urine dipstick shows not done.  Micro exam: negative for WBC's or RBC's.  Pertinent Imaging: CT Urogram 10/08/14 IMPRESSION: 1. Three bladder calculi are likely responsible for the patient's gross hematuria. 2. Markedly enlarged prostate gland with median lobe hypertrophy and mass effect on the base of the bladder. 3. 22 x 18.5 mm soft tissue mass in the right upper quadrant is most likely an exophytic adrenal gland adenoma. 4. Right-sided hydrocele and varicocele.  Results for orders placed or performed in visit on 05/27/15  BLADDER SCAN AMB NON-IMAGING  Result Value Ref Range   Scan Result 68ml      Assessment & Plan:  81 yo M with h/o gross hematuria, BPH, history of elevated PSA,  and incidental adrenal adenoma.  Doing well today s/p cystolitholapaxy.    1. Bladder stone S/p cystolitholapaxy.  No further episodes of gross hematuria.   - Urinalysis, Complete  2. Benign prostatic hyperplasia with urinary obstruction Doing well, minimal compliants.  PVR today minimal. - BLADDER SCAN AMB NON-IMAGING -continue doxazosin 4 mg daily  3. Adrenal adenoma, right Incidental, will defer work up give patient age, comborbities  61. History of elevated PSA No indication for further f/u given patients age   Return in about 1 year (around 05/26/2016) for IPSS, PVR.  Hollice Espy, MD  [facility] [address], [place], [place] [address] [phone]

## 2015-08-15 DIAGNOSIS — R310 Gross hematuria: Secondary | ICD-10-CM | POA: Diagnosis not present

## 2015-08-15 DIAGNOSIS — N200 Calculus of kidney: Secondary | ICD-10-CM | POA: Diagnosis not present

## 2015-08-15 DIAGNOSIS — R42 Dizziness and giddiness: Secondary | ICD-10-CM | POA: Diagnosis not present

## 2015-08-15 DIAGNOSIS — I10 Essential (primary) hypertension: Secondary | ICD-10-CM | POA: Diagnosis not present

## 2015-08-17 DIAGNOSIS — E784 Other hyperlipidemia: Secondary | ICD-10-CM | POA: Diagnosis not present

## 2015-08-17 DIAGNOSIS — I10 Essential (primary) hypertension: Secondary | ICD-10-CM | POA: Diagnosis not present

## 2015-08-17 DIAGNOSIS — R5381 Other malaise: Secondary | ICD-10-CM | POA: Diagnosis not present

## 2015-08-17 DIAGNOSIS — Z125 Encounter for screening for malignant neoplasm of prostate: Secondary | ICD-10-CM | POA: Diagnosis not present

## 2015-08-29 DIAGNOSIS — I10 Essential (primary) hypertension: Secondary | ICD-10-CM | POA: Diagnosis not present

## 2015-08-29 DIAGNOSIS — N200 Calculus of kidney: Secondary | ICD-10-CM | POA: Diagnosis not present

## 2015-10-06 DIAGNOSIS — I6523 Occlusion and stenosis of bilateral carotid arteries: Secondary | ICD-10-CM | POA: Diagnosis not present

## 2015-10-06 DIAGNOSIS — E669 Obesity, unspecified: Secondary | ICD-10-CM | POA: Diagnosis not present

## 2015-10-06 DIAGNOSIS — I6529 Occlusion and stenosis of unspecified carotid artery: Secondary | ICD-10-CM | POA: Diagnosis not present

## 2015-10-06 DIAGNOSIS — I10 Essential (primary) hypertension: Secondary | ICD-10-CM | POA: Diagnosis not present

## 2015-10-06 DIAGNOSIS — E785 Hyperlipidemia, unspecified: Secondary | ICD-10-CM | POA: Diagnosis not present

## 2015-10-18 DIAGNOSIS — M76891 Other specified enthesopathies of right lower limb, excluding foot: Secondary | ICD-10-CM | POA: Diagnosis not present

## 2015-10-18 DIAGNOSIS — M7060 Trochanteric bursitis, unspecified hip: Secondary | ICD-10-CM | POA: Diagnosis not present

## 2015-10-18 DIAGNOSIS — N200 Calculus of kidney: Secondary | ICD-10-CM | POA: Diagnosis not present

## 2015-10-18 DIAGNOSIS — M25559 Pain in unspecified hip: Secondary | ICD-10-CM | POA: Diagnosis not present

## 2015-10-25 DIAGNOSIS — M545 Low back pain: Secondary | ICD-10-CM | POA: Diagnosis not present

## 2015-10-25 DIAGNOSIS — M25551 Pain in right hip: Secondary | ICD-10-CM | POA: Diagnosis not present

## 2015-10-25 DIAGNOSIS — M25552 Pain in left hip: Secondary | ICD-10-CM | POA: Diagnosis not present

## 2015-12-05 DIAGNOSIS — N200 Calculus of kidney: Secondary | ICD-10-CM | POA: Diagnosis not present

## 2015-12-05 DIAGNOSIS — I10 Essential (primary) hypertension: Secondary | ICD-10-CM | POA: Diagnosis not present

## 2015-12-08 DIAGNOSIS — H43813 Vitreous degeneration, bilateral: Secondary | ICD-10-CM | POA: Diagnosis not present

## 2015-12-08 DIAGNOSIS — D3142 Benign neoplasm of left ciliary body: Secondary | ICD-10-CM | POA: Diagnosis not present

## 2015-12-19 DIAGNOSIS — N200 Calculus of kidney: Secondary | ICD-10-CM | POA: Diagnosis not present

## 2015-12-19 DIAGNOSIS — I10 Essential (primary) hypertension: Secondary | ICD-10-CM | POA: Diagnosis not present

## 2015-12-19 DIAGNOSIS — R310 Gross hematuria: Secondary | ICD-10-CM | POA: Diagnosis not present

## 2015-12-19 DIAGNOSIS — C6992 Malignant neoplasm of unspecified site of left eye: Secondary | ICD-10-CM | POA: Diagnosis not present

## 2016-02-16 DIAGNOSIS — M5126 Other intervertebral disc displacement, lumbar region: Secondary | ICD-10-CM | POA: Diagnosis not present

## 2016-02-16 DIAGNOSIS — R310 Gross hematuria: Secondary | ICD-10-CM | POA: Diagnosis not present

## 2016-02-16 DIAGNOSIS — I10 Essential (primary) hypertension: Secondary | ICD-10-CM | POA: Diagnosis not present

## 2016-04-17 DIAGNOSIS — I10 Essential (primary) hypertension: Secondary | ICD-10-CM | POA: Diagnosis not present

## 2016-04-17 DIAGNOSIS — R310 Gross hematuria: Secondary | ICD-10-CM | POA: Diagnosis not present

## 2016-04-17 DIAGNOSIS — N200 Calculus of kidney: Secondary | ICD-10-CM | POA: Diagnosis not present

## 2016-04-17 DIAGNOSIS — R4589 Other symptoms and signs involving emotional state: Secondary | ICD-10-CM | POA: Diagnosis not present

## 2016-05-17 DIAGNOSIS — R42 Dizziness and giddiness: Secondary | ICD-10-CM | POA: Diagnosis not present

## 2016-05-17 DIAGNOSIS — I10 Essential (primary) hypertension: Secondary | ICD-10-CM | POA: Diagnosis not present

## 2016-05-17 DIAGNOSIS — R55 Syncope and collapse: Secondary | ICD-10-CM | POA: Diagnosis not present

## 2016-05-17 DIAGNOSIS — R310 Gross hematuria: Secondary | ICD-10-CM | POA: Diagnosis not present

## 2016-05-22 DIAGNOSIS — R42 Dizziness and giddiness: Secondary | ICD-10-CM | POA: Diagnosis not present

## 2016-05-22 DIAGNOSIS — I10 Essential (primary) hypertension: Secondary | ICD-10-CM | POA: Diagnosis not present

## 2016-05-22 DIAGNOSIS — R310 Gross hematuria: Secondary | ICD-10-CM | POA: Diagnosis not present

## 2016-05-22 DIAGNOSIS — R55 Syncope and collapse: Secondary | ICD-10-CM | POA: Diagnosis not present

## 2016-05-29 ENCOUNTER — Ambulatory Visit: Payer: Commercial Managed Care - HMO | Admitting: Urology

## 2016-05-29 DIAGNOSIS — M25551 Pain in right hip: Secondary | ICD-10-CM | POA: Diagnosis not present

## 2016-06-25 ENCOUNTER — Ambulatory Visit
Admission: RE | Admit: 2016-06-25 | Discharge: 2016-06-25 | Disposition: A | Discharge status: Home / Self Care | Payer: Commercial Managed Care - HMO | Source: Ambulatory Visit | Attending: Internal Medicine | Admitting: Internal Medicine

## 2016-06-25 ENCOUNTER — Other Ambulatory Visit: Payer: Self-pay | Admitting: Cardiology

## 2016-06-25 ENCOUNTER — Ambulatory Visit
Admission: RE | Admit: 2016-06-25 | Discharge: 2016-06-25 | Disposition: A | Discharge status: Home / Self Care | Payer: Commercial Managed Care - HMO | Source: Ambulatory Visit | Attending: Cardiology | Admitting: Cardiology

## 2016-06-25 DIAGNOSIS — R0602 Shortness of breath: Secondary | ICD-10-CM

## 2016-06-25 DIAGNOSIS — N200 Calculus of kidney: Secondary | ICD-10-CM | POA: Diagnosis not present

## 2016-06-25 DIAGNOSIS — R42 Dizziness and giddiness: Secondary | ICD-10-CM | POA: Insufficient documentation

## 2016-06-25 DIAGNOSIS — I700 Atherosclerosis of aorta: Secondary | ICD-10-CM | POA: Diagnosis not present

## 2016-06-25 DIAGNOSIS — R5381 Other malaise: Secondary | ICD-10-CM | POA: Diagnosis not present

## 2016-06-25 DIAGNOSIS — R938 Abnormal findings on diagnostic imaging of other specified body structures: Secondary | ICD-10-CM | POA: Diagnosis not present

## 2016-06-25 DIAGNOSIS — R55 Syncope and collapse: Secondary | ICD-10-CM | POA: Diagnosis not present

## 2016-06-25 DIAGNOSIS — I10 Essential (primary) hypertension: Secondary | ICD-10-CM | POA: Diagnosis not present

## 2016-06-25 DIAGNOSIS — E784 Other hyperlipidemia: Secondary | ICD-10-CM | POA: Diagnosis not present

## 2016-07-02 DIAGNOSIS — I10 Essential (primary) hypertension: Secondary | ICD-10-CM | POA: Diagnosis not present

## 2016-07-02 DIAGNOSIS — R310 Gross hematuria: Secondary | ICD-10-CM | POA: Diagnosis not present

## 2016-07-02 DIAGNOSIS — R42 Dizziness and giddiness: Secondary | ICD-10-CM | POA: Diagnosis not present

## 2016-07-02 DIAGNOSIS — R55 Syncope and collapse: Secondary | ICD-10-CM | POA: Diagnosis not present

## 2016-07-30 DIAGNOSIS — N200 Calculus of kidney: Secondary | ICD-10-CM | POA: Diagnosis not present

## 2016-07-30 DIAGNOSIS — R5381 Other malaise: Secondary | ICD-10-CM | POA: Diagnosis not present

## 2016-07-30 DIAGNOSIS — Z23 Encounter for immunization: Secondary | ICD-10-CM | POA: Diagnosis not present

## 2016-07-30 DIAGNOSIS — D509 Iron deficiency anemia, unspecified: Secondary | ICD-10-CM | POA: Diagnosis not present

## 2016-07-30 DIAGNOSIS — R55 Syncope and collapse: Secondary | ICD-10-CM | POA: Diagnosis not present

## 2016-07-30 DIAGNOSIS — N182 Chronic kidney disease, stage 2 (mild): Secondary | ICD-10-CM | POA: Diagnosis not present

## 2016-07-30 DIAGNOSIS — I10 Essential (primary) hypertension: Secondary | ICD-10-CM | POA: Diagnosis not present

## 2016-08-03 DIAGNOSIS — R55 Syncope and collapse: Secondary | ICD-10-CM | POA: Diagnosis not present

## 2016-08-03 DIAGNOSIS — D649 Anemia, unspecified: Secondary | ICD-10-CM | POA: Diagnosis not present

## 2016-08-03 DIAGNOSIS — R42 Dizziness and giddiness: Secondary | ICD-10-CM | POA: Diagnosis not present

## 2016-08-03 DIAGNOSIS — D509 Iron deficiency anemia, unspecified: Secondary | ICD-10-CM | POA: Diagnosis not present

## 2016-08-07 DIAGNOSIS — M545 Low back pain: Secondary | ICD-10-CM | POA: Diagnosis not present

## 2016-08-14 DIAGNOSIS — M545 Low back pain: Secondary | ICD-10-CM | POA: Diagnosis not present

## 2016-08-23 DIAGNOSIS — M48061 Spinal stenosis, lumbar region without neurogenic claudication: Secondary | ICD-10-CM | POA: Diagnosis not present

## 2016-08-23 DIAGNOSIS — M545 Low back pain: Secondary | ICD-10-CM | POA: Diagnosis not present

## 2016-08-31 DIAGNOSIS — N182 Chronic kidney disease, stage 2 (mild): Secondary | ICD-10-CM | POA: Diagnosis not present

## 2016-08-31 DIAGNOSIS — I10 Essential (primary) hypertension: Secondary | ICD-10-CM | POA: Diagnosis not present

## 2016-08-31 DIAGNOSIS — R310 Gross hematuria: Secondary | ICD-10-CM | POA: Diagnosis not present

## 2016-08-31 DIAGNOSIS — D509 Iron deficiency anemia, unspecified: Secondary | ICD-10-CM | POA: Diagnosis not present

## 2016-09-03 DIAGNOSIS — M545 Low back pain: Secondary | ICD-10-CM | POA: Diagnosis not present

## 2016-09-03 DIAGNOSIS — M48061 Spinal stenosis, lumbar region without neurogenic claudication: Secondary | ICD-10-CM | POA: Diagnosis not present

## 2016-09-18 DIAGNOSIS — M48061 Spinal stenosis, lumbar region without neurogenic claudication: Secondary | ICD-10-CM | POA: Diagnosis not present

## 2016-09-18 DIAGNOSIS — M47817 Spondylosis without myelopathy or radiculopathy, lumbosacral region: Secondary | ICD-10-CM | POA: Diagnosis not present

## 2016-09-18 DIAGNOSIS — M545 Low back pain: Secondary | ICD-10-CM | POA: Diagnosis not present

## 2016-09-24 DIAGNOSIS — M47817 Spondylosis without myelopathy or radiculopathy, lumbosacral region: Secondary | ICD-10-CM | POA: Diagnosis not present

## 2016-09-24 DIAGNOSIS — M545 Low back pain: Secondary | ICD-10-CM | POA: Diagnosis not present

## 2016-10-02 ENCOUNTER — Other Ambulatory Visit (INDEPENDENT_AMBULATORY_CARE_PROVIDER_SITE_OTHER): Payer: Self-pay | Admitting: Vascular Surgery

## 2016-10-02 DIAGNOSIS — I6523 Occlusion and stenosis of bilateral carotid arteries: Secondary | ICD-10-CM

## 2016-10-05 DIAGNOSIS — D369 Benign neoplasm, unspecified site: Secondary | ICD-10-CM | POA: Diagnosis not present

## 2016-10-05 DIAGNOSIS — D649 Anemia, unspecified: Secondary | ICD-10-CM | POA: Diagnosis not present

## 2016-10-08 ENCOUNTER — Encounter (INDEPENDENT_AMBULATORY_CARE_PROVIDER_SITE_OTHER): Payer: Self-pay

## 2016-10-08 ENCOUNTER — Ambulatory Visit (INDEPENDENT_AMBULATORY_CARE_PROVIDER_SITE_OTHER): Payer: Self-pay | Admitting: Vascular Surgery

## 2016-10-09 DIAGNOSIS — D649 Anemia, unspecified: Secondary | ICD-10-CM | POA: Diagnosis not present

## 2016-10-09 DIAGNOSIS — D369 Benign neoplasm, unspecified site: Secondary | ICD-10-CM | POA: Diagnosis not present

## 2016-10-10 ENCOUNTER — Ambulatory Visit
Admission: RE | Admit: 2016-10-10 | Discharge: 2016-10-10 | Disposition: A | Discharge status: Home / Self Care | Payer: Commercial Managed Care - HMO | Source: Ambulatory Visit | Attending: Vascular Surgery | Admitting: Vascular Surgery

## 2016-10-10 DIAGNOSIS — I6521 Occlusion and stenosis of right carotid artery: Secondary | ICD-10-CM | POA: Diagnosis not present

## 2016-10-10 DIAGNOSIS — I6523 Occlusion and stenosis of bilateral carotid arteries: Secondary | ICD-10-CM | POA: Insufficient documentation

## 2016-10-11 DIAGNOSIS — M47817 Spondylosis without myelopathy or radiculopathy, lumbosacral region: Secondary | ICD-10-CM | POA: Diagnosis not present

## 2016-10-11 DIAGNOSIS — M545 Low back pain: Secondary | ICD-10-CM | POA: Diagnosis not present

## 2016-11-08 DIAGNOSIS — I10 Essential (primary) hypertension: Secondary | ICD-10-CM | POA: Diagnosis not present

## 2016-11-08 DIAGNOSIS — R42 Dizziness and giddiness: Secondary | ICD-10-CM | POA: Diagnosis not present

## 2016-11-08 DIAGNOSIS — D509 Iron deficiency anemia, unspecified: Secondary | ICD-10-CM | POA: Diagnosis not present

## 2016-11-08 DIAGNOSIS — R55 Syncope and collapse: Secondary | ICD-10-CM | POA: Diagnosis not present

## 2017-01-01 ENCOUNTER — Emergency Department: Payer: Medicare HMO

## 2017-01-01 ENCOUNTER — Encounter: Payer: Self-pay | Admitting: Emergency Medicine

## 2017-01-01 ENCOUNTER — Emergency Department
Admission: EM | Admit: 2017-01-01 | Discharge: 2017-01-01 | Disposition: A | Discharge status: Home / Self Care | Payer: Medicare HMO | Attending: Emergency Medicine | Admitting: Emergency Medicine

## 2017-01-01 DIAGNOSIS — I2510 Atherosclerotic heart disease of native coronary artery without angina pectoris: Secondary | ICD-10-CM | POA: Diagnosis not present

## 2017-01-01 DIAGNOSIS — Z7982 Long term (current) use of aspirin: Secondary | ICD-10-CM | POA: Diagnosis not present

## 2017-01-01 DIAGNOSIS — Z79899 Other long term (current) drug therapy: Secondary | ICD-10-CM | POA: Insufficient documentation

## 2017-01-01 DIAGNOSIS — Z87891 Personal history of nicotine dependence: Secondary | ICD-10-CM | POA: Insufficient documentation

## 2017-01-01 DIAGNOSIS — Z791 Long term (current) use of non-steroidal anti-inflammatories (NSAID): Secondary | ICD-10-CM | POA: Diagnosis not present

## 2017-01-01 DIAGNOSIS — I10 Essential (primary) hypertension: Secondary | ICD-10-CM | POA: Insufficient documentation

## 2017-01-01 DIAGNOSIS — R55 Syncope and collapse: Secondary | ICD-10-CM | POA: Diagnosis not present

## 2017-01-01 DIAGNOSIS — R42 Dizziness and giddiness: Secondary | ICD-10-CM | POA: Diagnosis not present

## 2017-01-01 DIAGNOSIS — R531 Weakness: Secondary | ICD-10-CM | POA: Diagnosis not present

## 2017-01-01 DIAGNOSIS — R404 Transient alteration of awareness: Secondary | ICD-10-CM | POA: Diagnosis not present

## 2017-01-01 LAB — COMPREHENSIVE METABOLIC PANEL
ALT: 12 U/L — AB (ref 17–63)
AST: 23 U/L (ref 15–41)
Albumin: 3.8 g/dL (ref 3.5–5.0)
Alkaline Phosphatase: 40 U/L (ref 38–126)
Anion gap: 7 (ref 5–15)
BUN: 26 mg/dL — ABNORMAL HIGH (ref 6–20)
CO2: 26 mmol/L (ref 22–32)
CREATININE: 1.53 mg/dL — AB (ref 0.61–1.24)
Calcium: 8.8 mg/dL — ABNORMAL LOW (ref 8.9–10.3)
Chloride: 106 mmol/L (ref 101–111)
GFR calc Af Amer: 46 mL/min — ABNORMAL LOW (ref >60)
GFR, EST NON AFRICAN AMERICAN: 40 mL/min — AB (ref >60)
Glucose, Bld: 118 mg/dL — ABNORMAL HIGH (ref 65–99)
Potassium: 4.3 mmol/L (ref 3.5–5.1)
Sodium: 139 mmol/L (ref 135–145)
Total Bilirubin: 0.6 mg/dL (ref 0.3–1.2)
Total Protein: 6.5 g/dL (ref 6.5–8.1)

## 2017-01-01 LAB — CBC WITH DIFFERENTIAL/PLATELET
BASOS PCT: 1 %
Basophils Absolute: 0 10*3/uL (ref 0–0.1)
EOS PCT: 2 %
Eosinophils Absolute: 0.1 10*3/uL (ref 0–0.7)
HCT: 28.7 % — ABNORMAL LOW (ref 40.0–52.0)
Hemoglobin: 10.4 g/dL — ABNORMAL LOW (ref 13.0–18.0)
LYMPHS ABS: 1.6 10*3/uL (ref 1.0–3.6)
Lymphocytes Relative: 29 %
MCH: 32.1 pg (ref 26.0–34.0)
MCHC: 36.1 g/dL — AB (ref 32.0–36.0)
MCV: 89 fL (ref 80.0–100.0)
Monocytes Absolute: 0.5 10*3/uL (ref 0.2–1.0)
Monocytes Relative: 9 %
Neutro Abs: 3.4 10*3/uL (ref 1.4–6.5)
Neutrophils Relative %: 59 %
Platelets: 233 10*3/uL (ref 150–440)
RBC: 3.23 MIL/uL — ABNORMAL LOW (ref 4.40–5.90)
RDW: 14.2 % (ref 11.5–14.5)
WBC: 5.7 10*3/uL (ref 3.8–10.6)

## 2017-01-01 LAB — TROPONIN I
Troponin I: <0.03 ng/mL (ref <0.03)
Troponin I: <0.03 ng/mL (ref <0.03)

## 2017-01-01 LAB — GLUCOSE, CAPILLARY: Glucose-Capillary: 145 mg/dL — ABNORMAL HIGH (ref 65–99)

## 2017-01-01 NOTE — ED Provider Notes (Signed)
[facility] Emergency Department Provider Note   ____________________________________________   First MD Initiated Contact with Patient 01/01/17 1616     (approximate)  I have reviewed the triage vital signs and the nursing notes.   HISTORY  Chief Complaint Near Syncope    HPI Charles Sims is a 83 y.o. male who reports he was at [employer] and got weak and dizzy at delay his head down he felt like he was coming almost pass out but never did quite do that he's had at least one of these episodes previously after eating may have had more than 1 not told his doctor about them. He reports she had left sided endarterectomy carotid endarterectomy several years ago his been following up with Dr. Hinton Lovely for that. He is back to baseline now since these attacks last about 30 minutes. He took a gabapentin and Tylenol 4 hours previously and they thought perhaps that was what it was but 4 hours seems to be a bit long.   Past Medical History:  Diagnosis Date  . Adenoma of right adrenal gland   . Arthritis   . Benign prostatic hypertrophy with lower urinary tract symptoms (LUTS)   . Bladder calculi   . Bradycardia   . CAD (coronary artery disease)   . Carotid stenosis   . Coronary artery disease   . Dysrhythmia    bradycardia  . Elevated PSA   . GERD (gastroesophageal reflux disease)   . Gross hematuria   . Hyperlipidemia   . Hypertension   . Prostatitis   . Shortness of breath dyspnea   . Urinary frequency     Patient Active Problem List   Diagnosis Date Noted  . Bradycardia, sinus 02/13/2014  . Chest pain 02/13/2014  . Dizziness 02/13/2014  . Breathlessness on exertion 02/13/2014  . BP (high blood pressure) 02/13/2014  . Carotid stenosis 02/13/2014  . Benign prostatic hyperplasia with urinary obstruction 11/03/2012  . Chronic prostatitis 11/03/2012  . Abnormal prostate specific antigen 11/03/2012  . Prostatitis 11/03/2012  .  Urgency of micturation 11/03/2012  . FOM (frequency of micturition) 11/03/2012  . CAD S/P percutaneous coronary angioplasty 06/04/2012    Past Surgical History:  Procedure Laterality Date  . CAROTID ENDARTERECTOMY Left   . CYSTOSCOPY WITH LITHOLAPAXY N/A 04/13/2015   Procedure: CYSTOSCOPY WITH LITHOLAPAXY;  Surgeon: Hollice Espy, MD;  Location: [facility] ORS;  Service: Urology;  Laterality: N/A;    Prior to Admission medications   Medication Sig Start Date End Date Taking? Authorizing Provider  aspirin EC 81 MG tablet Take 81 mg by mouth daily.    Historical Provider, MD  bismuth subsalicylate (PEPTO BISMOL) 262 MG/15ML suspension Take 45 mLs by mouth once. 04/13/15   Historical Provider, MD  doxazosin (CARDURA) 4 MG tablet Take 4 mg by mouth daily.    Historical Provider, MD  loperamide (IMODIUM A-D) 2 MG tablet Take 2 mg by mouth daily.    Historical Provider, MD  losartan-hydrochlorothiazide (HYZAAR) 50-12.5 MG per tablet Take 1 tablet by mouth daily.    Historical Provider, MD  meloxicam (MOBIC) 15 MG tablet  05/17/15   Historical Provider, MD  omeprazole (PRILOSEC) 20 MG capsule Take 20 mg by mouth daily.    Historical Provider, MD  simvastatin (ZOCOR) 20 MG tablet Take 20 mg by mouth daily.    Historical Provider, MD  traMADol Veatrice Bourbon) 50 MG tablet  05/18/15   Historical Provider, MD    Allergies Lisinopril  Family  History  Problem Relation Age of Onset  . Kidney cancer Father   . Kidney cancer Paternal Grandfather   . Alzheimer's disease Mother     Social History Social History  Substance Use Topics  . Smoking status: Former Smoker    Quit date: 04/04/2005  . Smokeless tobacco: Never Used  . Alcohol use No    Review of Systems Constitutional: No fever/chills Eyes: No visual changes. ENT: No sore throat. Cardiovascular: Denies chest pain. Respiratory: Denies shortness of breath. Gastrointestinal: No abdominal pain.  No nausea, no vomiting.  No diarrhea.  No  constipation. Genitourinary: Negative for dysuria. Musculoskeletal: Negative for back pain. Skin: Negative for rash. Neurological: Negative for headaches, focal weakness or numbness.  10-point ROS otherwise negative.  ____________________________________________   PHYSICAL EXAM:  VITAL SIGNS: ED Triage Vitals  Enc Vitals Group     BP 01/01/17 1611 (!) 155/66     Pulse Rate 01/01/17 1611 70     Resp 01/01/17 1611 13     Temp 01/01/17 1611 97.7 F (36.5 C)     Temp Source 01/01/17 1611 Oral     SpO2 01/01/17 1611 96 %     Weight 01/01/17 1612 200 lb (90.7 kg)     Height 01/01/17 1612 5\' 8"  (1.727 m)     Head Circumference --      Peak Flow --      Pain Score --      Pain Loc --      Pain Edu? --      Excl. in [place]? --     Constitutional: Alert and oriented. Well appearing and in no acute distress. Eyes: Conjunctivae are normal. PERRL. EOMI. Head: Atraumatic. Nose: No congestion/rhinnorhea. Mouth/Throat: Mucous membranes are moist.  Oropharynx non-erythematous. Neck: No stridor. Patient has a bruit on the right side of his neck in the supraclavicular area this may be consistent with subclavian steal syndrome there are no murmurs when I listen to his heart Cardiovascular: Normal rate, regular rhythm. Grossly normal heart sounds.  Good peripheral circulation. Respiratory: Normal respiratory effort.  No retractions. Lungs CTAB. Gastrointestinal: Soft and nontender. No distention. No abdominal bruits. No CVA tenderness.  Musculoskeletal: No lower extremity tenderness nor edema.  No joint effusions. Neurologic:  Normal speech and language. No gross focal neurologic deficits are appreciated. No gait instability. Skin:  Skin is warm, dry and intact. No rash noted. Psychiatric: Mood and affect are normal. Speech and behavior are normal.  ____________________________________________   LABS (all labs ordered are listed, but only abnormal results are displayed)  Labs Reviewed   COMPREHENSIVE METABOLIC PANEL - Abnormal; Notable for the following:       Result Value   Glucose, Bld 118 (*)    BUN 26 (*)    Creatinine, Ser 1.53 (*)    Calcium 8.8 (*)    ALT 12 (*)    GFR calc non Af Amer 40 (*)    GFR calc Af Amer 46 (*)    All other components within normal limits  CBC WITH DIFFERENTIAL/PLATELET - Abnormal; Notable for the following:    RBC 3.23 (*)    Hemoglobin 10.4 (*)    HCT 28.7 (*)    MCHC 36.1 (*)    All other components within normal limits  GLUCOSE, CAPILLARY - Abnormal; Notable for the following:    Glucose-Capillary 145 (*)    All other components within normal limits  TROPONIN I  TROPONIN I   ____________________________________________  EKG  EKG  read and interpreted by me shows normal sinus rhythm rate of 68 normal axis no obvious acute ST-T wave changes. There is some increased R wave progression but that is probably due to lead placement. ____________________________________________  RADIOLOGY Study Result   CLINICAL DATA:  Near syncope, weakness and dizziness starting when he was eating lunch today  EXAM: PORTABLE CHEST 1 VIEW  COMPARISON:  06/25/2016  FINDINGS: Cardiomediastinal silhouette is stable. No infiltrate or pleural effusion. No pulmonary edema. Mild degenerative changes thoracic spine.  IMPRESSION: No active disease.   Electronically Signed   By: Lahoma Crocker M.D.   On: 01/01/2017 16:40    ____________________________________________   PROCEDURES  Procedure(s) performed:   Procedures  Critical Care performed:  ____________________________________________   INITIAL IMPRESSION / ASSESSMENT AND PLAN / ED COURSE  Pertinent labs & imaging results that were available during my care of the patient were reviewed by me and considered in my medical decision making (see chart for details).        ____________________________________________   FINAL CLINICAL IMPRESSION(S) / ED  DIAGNOSES  Final diagnoses:  Near syncope      NEW MEDICATIONS STARTED DURING THIS VISIT:  New Prescriptions   No medications on file     Note:  This document was prepared using Dragon voice recognition software and may include unintentional dictation errors.    Nena Polio, MD 01/01/17 1910

## 2017-01-01 NOTE — ED Triage Notes (Signed)
Pt via ems from [employer]. He was eating lunch with his wife and began to feel weak and dizzy. He had to lay his head down, and EMS was called to transport him. Pt states he has been having episodes like this approximately monthly for a couple of years but has never sought medical treatment for them. Pt is alert & oriented and states he feels "fine" now.

## 2017-01-01 NOTE — Discharge Instructions (Signed)
Please follow-up with Dr. Rebecka Apley. Call him tomorrow and let him know you had these episodes should be on a CT quickly. Please let him know you have a bruit in the right supraclavicular area. Just show him this note that should be good. Thing called subclavian steal which could potentially make you have these passing out episodes. Please return if you're worse at all.

## 2017-01-03 DIAGNOSIS — D509 Iron deficiency anemia, unspecified: Secondary | ICD-10-CM | POA: Diagnosis not present

## 2017-01-03 DIAGNOSIS — R42 Dizziness and giddiness: Secondary | ICD-10-CM | POA: Diagnosis not present

## 2017-01-03 DIAGNOSIS — I10 Essential (primary) hypertension: Secondary | ICD-10-CM | POA: Diagnosis not present

## 2017-01-03 DIAGNOSIS — R55 Syncope and collapse: Secondary | ICD-10-CM | POA: Diagnosis not present

## 2017-01-15 DIAGNOSIS — I10 Essential (primary) hypertension: Secondary | ICD-10-CM | POA: Diagnosis not present

## 2017-01-15 DIAGNOSIS — D509 Iron deficiency anemia, unspecified: Secondary | ICD-10-CM | POA: Diagnosis not present

## 2017-01-15 DIAGNOSIS — R55 Syncope and collapse: Secondary | ICD-10-CM | POA: Diagnosis not present

## 2017-01-21 DIAGNOSIS — R310 Gross hematuria: Secondary | ICD-10-CM | POA: Diagnosis not present

## 2017-01-21 DIAGNOSIS — R42 Dizziness and giddiness: Secondary | ICD-10-CM | POA: Diagnosis not present

## 2017-01-21 DIAGNOSIS — R55 Syncope and collapse: Secondary | ICD-10-CM | POA: Diagnosis not present

## 2017-01-21 DIAGNOSIS — D509 Iron deficiency anemia, unspecified: Secondary | ICD-10-CM | POA: Diagnosis not present

## 2017-01-25 DIAGNOSIS — M47817 Spondylosis without myelopathy or radiculopathy, lumbosacral region: Secondary | ICD-10-CM | POA: Diagnosis not present

## 2017-01-25 DIAGNOSIS — M545 Low back pain: Secondary | ICD-10-CM | POA: Diagnosis not present

## 2017-02-11 DIAGNOSIS — M545 Low back pain: Secondary | ICD-10-CM | POA: Diagnosis not present

## 2017-02-11 DIAGNOSIS — M47817 Spondylosis without myelopathy or radiculopathy, lumbosacral region: Secondary | ICD-10-CM | POA: Diagnosis not present

## 2017-02-24 IMAGING — US US CAROTID DUPLEX BILAT
1 series · 13 of 24 positions shown · non-contrast
Comparison: 01/22/2008 CT neck 01/30/2008

CLINICAL DATA: 86-year-old male with a history of carotid stenosis.

Cardiovascular risk factors are known vascular disease with prior
vascular surgery.
EXAM:
BILATERAL CAROTID DUPLEX ULTRASOUND
TECHNIQUE: Gray scale imaging, color Doppler and duplex ultrasound were
performed of bilateral carotid and vertebral arteries in the neck.

[Series 1: us carotid duplex bilat · 13 of 66 slices shown]
[im 1/66]
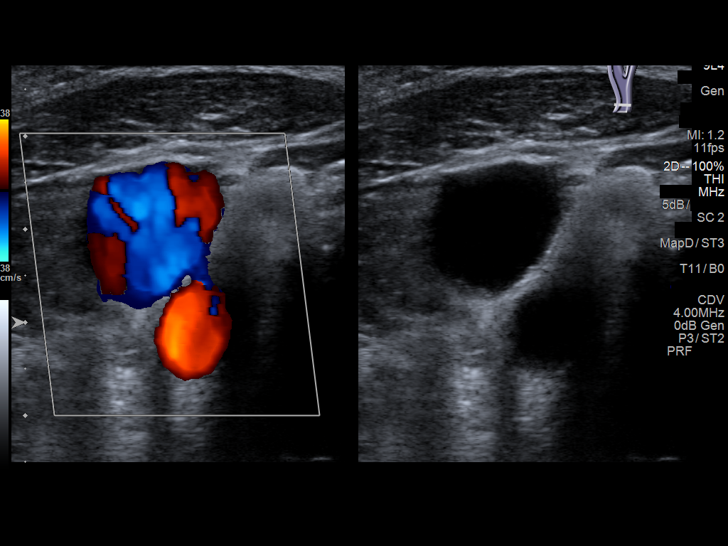
[im 6/66]
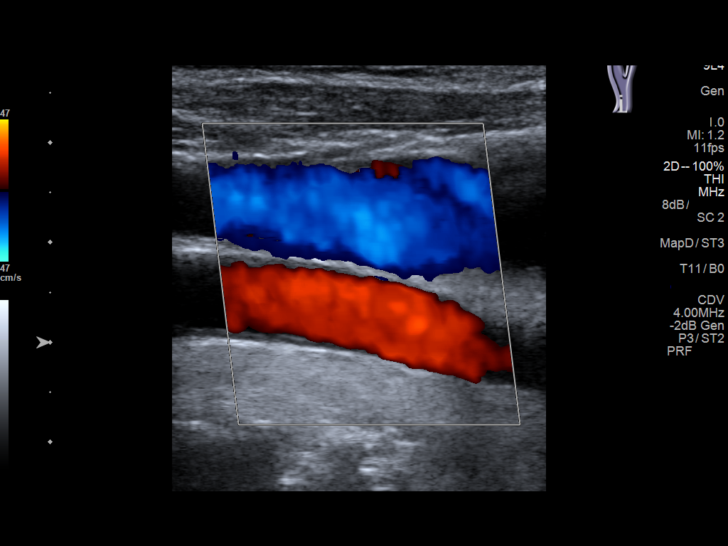
[im 12/66]
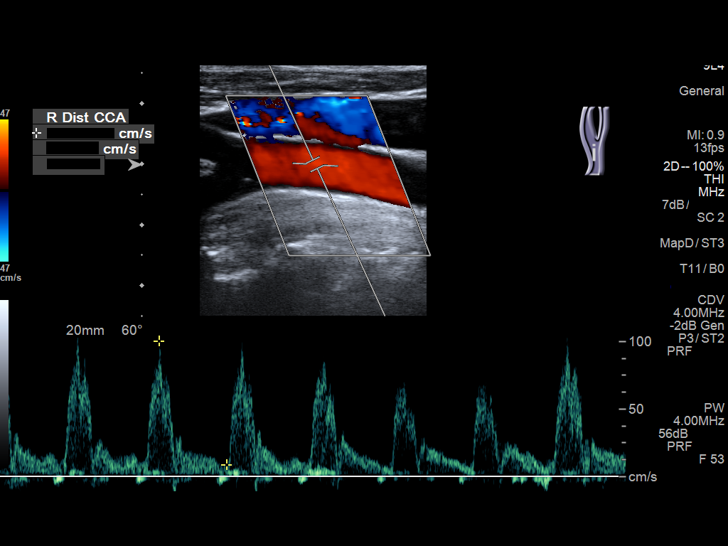
[im 17/66]
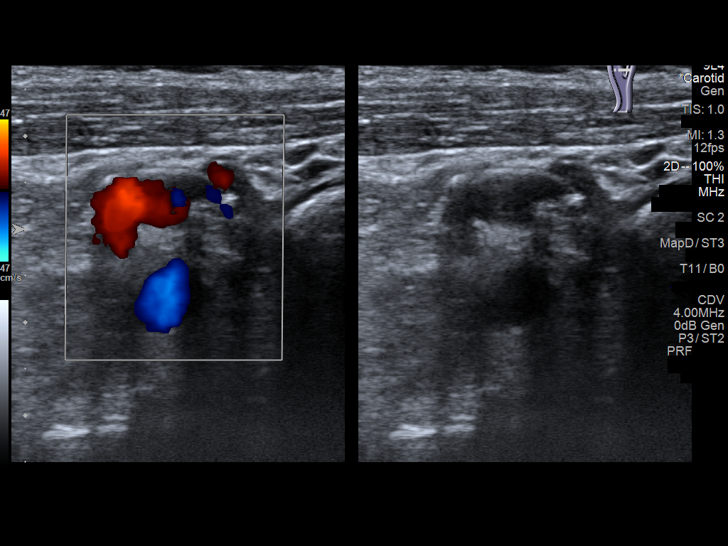
[im 23/66]
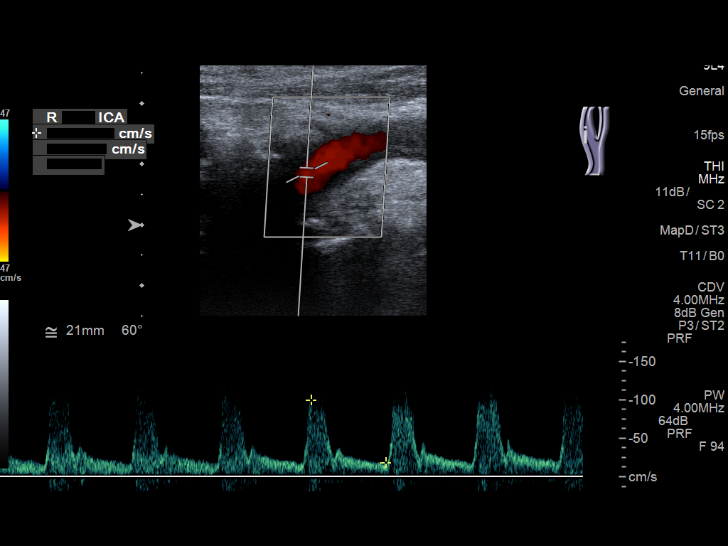
[im 29/66]
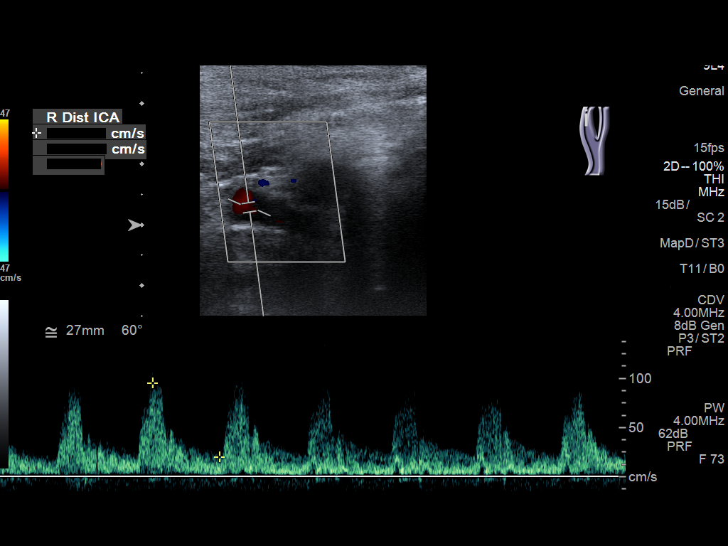
[im 34/66]
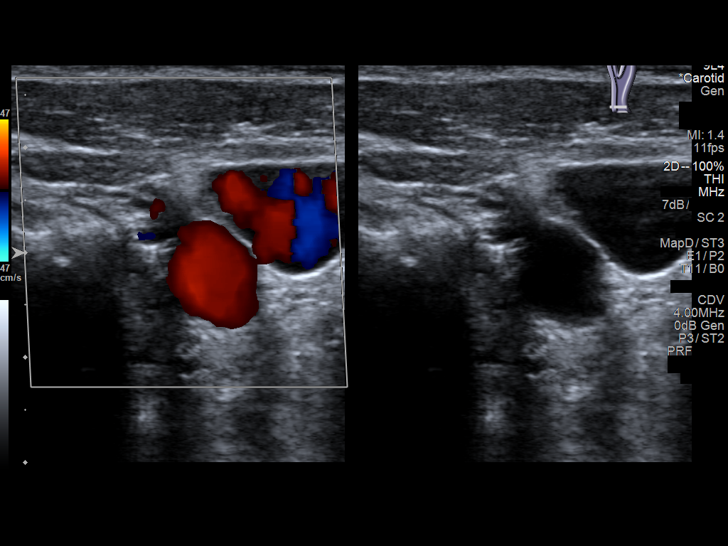
[im 37/66]
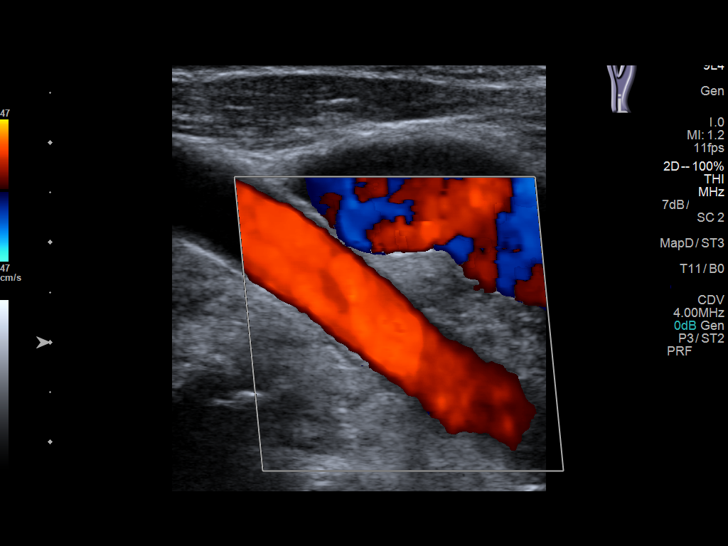
[im 43/66]
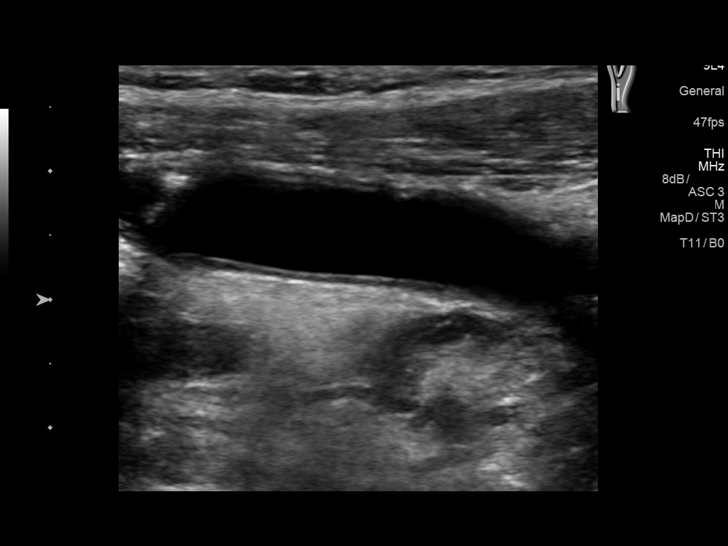
[im 49/66]
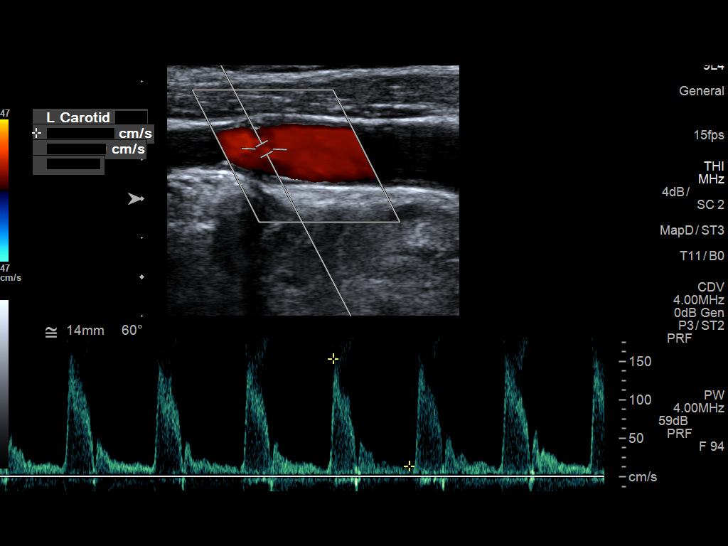
[im 54/66]
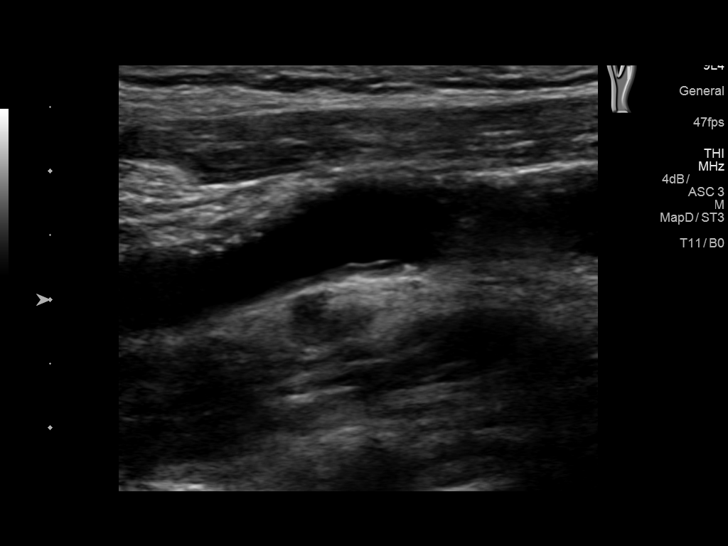
[im 60/66]
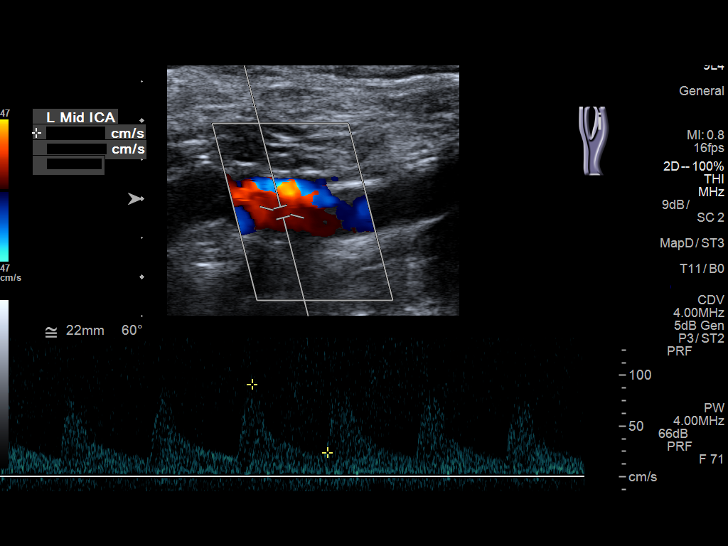
[im 66/66]
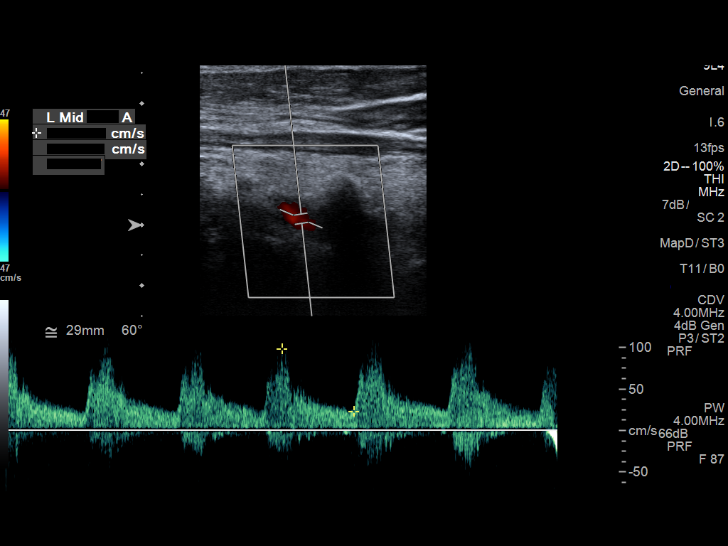

[13 of 24 positions shown; findings below may reference images not displayed]

FINDINGS: Criteria: Quantification of carotid stenosis is based on velocity
parameters that correlate the residual internal carotid diameter
with NASCET-based stenosis levels, using the diameter of the distal
internal carotid lumen as the denominator for stenosis measurement.

The following velocity measurements were obtained:

RIGHT

ICA:  Systolic 100 cm/sec, Diastolic 19 cm/sec

CCA:  100 cm/sec

SYSTOLIC ICA/CCA RATIO:

ECA:  92 cm/sec

LEFT

ICA:  Systolic 110 cm/sec, Diastolic 21 cm/sec

CCA:  180 cm/sec

SYSTOLIC ICA/CCA RATIO:

ECA:  165 cm/sec

Right Brachial SBP: Not acquired

Left Brachial SBP: Not acquired

RIGHT CAROTID ARTERY: No significant calcifications of the right
common carotid artery. Intermediate waveform maintained.
Heterogeneous and partially calcified plaque at the right carotid
bifurcation. No significant lumen shadowing. Low resistance waveform
of the right ICA. No significant tortuosity.

RIGHT VERTEBRAL ARTERY: Antegrade flow with low resistance waveform.

LEFT CAROTID ARTERY: No significant calcifications of the left
common carotid artery. Intermediate waveform maintained. Apparent
postsurgical changes at the left carotid bifurcation. No significant
intimal hyperplasia/thickening, or calcified atherosclerotic
disease. Low resistance waveform of the left ICA.

LEFT VERTEBRAL ARTERY:  Antegrade flow with low resistance waveform.
IMPRESSION: Right:

Color duplex indicates minimal heterogeneous and calcified plaque,
with no hemodynamically significant stenosis by duplex criteria in
the extracranial cerebrovascular circulation.

Left:

There are apparent surgical changes at the left carotid bifurcation
of prior carotid endarterectomy. Note that duplex criteria have not
been validated for evaluation in the postoperative CEA setting,
however, there is no evidence of recurrent stenosis on the current
study.

## 2017-04-22 DIAGNOSIS — N200 Calculus of kidney: Secondary | ICD-10-CM | POA: Diagnosis not present

## 2017-04-22 DIAGNOSIS — D509 Iron deficiency anemia, unspecified: Secondary | ICD-10-CM | POA: Diagnosis not present

## 2017-04-22 DIAGNOSIS — N182 Chronic kidney disease, stage 2 (mild): Secondary | ICD-10-CM | POA: Diagnosis not present

## 2017-04-22 DIAGNOSIS — I10 Essential (primary) hypertension: Secondary | ICD-10-CM | POA: Diagnosis not present

## 2017-05-30 DIAGNOSIS — M545 Low back pain: Secondary | ICD-10-CM | POA: Diagnosis not present

## 2017-05-30 DIAGNOSIS — M47817 Spondylosis without myelopathy or radiculopathy, lumbosacral region: Secondary | ICD-10-CM | POA: Diagnosis not present

## 2017-07-23 DIAGNOSIS — D509 Iron deficiency anemia, unspecified: Secondary | ICD-10-CM | POA: Diagnosis not present

## 2017-07-23 DIAGNOSIS — R310 Gross hematuria: Secondary | ICD-10-CM | POA: Diagnosis not present

## 2017-07-23 DIAGNOSIS — Z23 Encounter for immunization: Secondary | ICD-10-CM | POA: Diagnosis not present

## 2017-07-23 DIAGNOSIS — R5381 Other malaise: Secondary | ICD-10-CM | POA: Diagnosis not present

## 2017-07-23 DIAGNOSIS — E784 Other hyperlipidemia: Secondary | ICD-10-CM | POA: Diagnosis not present

## 2017-07-23 DIAGNOSIS — R413 Other amnesia: Secondary | ICD-10-CM | POA: Diagnosis not present

## 2017-07-23 DIAGNOSIS — R55 Syncope and collapse: Secondary | ICD-10-CM | POA: Diagnosis not present

## 2017-07-23 DIAGNOSIS — I10 Essential (primary) hypertension: Secondary | ICD-10-CM | POA: Diagnosis not present

## 2017-09-20 DIAGNOSIS — M545 Low back pain: Secondary | ICD-10-CM | POA: Diagnosis not present

## 2017-09-20 DIAGNOSIS — M48061 Spinal stenosis, lumbar region without neurogenic claudication: Secondary | ICD-10-CM | POA: Diagnosis not present

## 2017-10-08 DIAGNOSIS — M48061 Spinal stenosis, lumbar region without neurogenic claudication: Secondary | ICD-10-CM | POA: Diagnosis not present

## 2017-10-08 DIAGNOSIS — M47817 Spondylosis without myelopathy or radiculopathy, lumbosacral region: Secondary | ICD-10-CM | POA: Diagnosis not present

## 2017-10-08 DIAGNOSIS — M545 Low back pain: Secondary | ICD-10-CM | POA: Diagnosis not present

## 2017-10-24 DIAGNOSIS — M545 Low back pain: Secondary | ICD-10-CM | POA: Diagnosis not present

## 2017-10-24 DIAGNOSIS — M47817 Spondylosis without myelopathy or radiculopathy, lumbosacral region: Secondary | ICD-10-CM | POA: Diagnosis not present

## 2017-10-24 DIAGNOSIS — M48061 Spinal stenosis, lumbar region without neurogenic claudication: Secondary | ICD-10-CM | POA: Diagnosis not present

## 2017-11-18 DIAGNOSIS — M545 Low back pain: Secondary | ICD-10-CM | POA: Diagnosis not present

## 2017-11-29 DIAGNOSIS — R310 Gross hematuria: Secondary | ICD-10-CM | POA: Diagnosis not present

## 2017-11-29 DIAGNOSIS — R55 Syncope and collapse: Secondary | ICD-10-CM | POA: Diagnosis not present

## 2017-11-29 DIAGNOSIS — D509 Iron deficiency anemia, unspecified: Secondary | ICD-10-CM | POA: Diagnosis not present

## 2017-11-29 DIAGNOSIS — R42 Dizziness and giddiness: Secondary | ICD-10-CM | POA: Diagnosis not present

## 2017-12-02 DIAGNOSIS — R5381 Other malaise: Secondary | ICD-10-CM | POA: Diagnosis not present

## 2017-12-02 DIAGNOSIS — E7849 Other hyperlipidemia: Secondary | ICD-10-CM | POA: Diagnosis not present

## 2017-12-02 DIAGNOSIS — R413 Other amnesia: Secondary | ICD-10-CM | POA: Diagnosis not present

## 2017-12-02 DIAGNOSIS — I10 Essential (primary) hypertension: Secondary | ICD-10-CM | POA: Diagnosis not present

## 2017-12-02 DIAGNOSIS — Z1329 Encounter for screening for other suspected endocrine disorder: Secondary | ICD-10-CM | POA: Diagnosis not present

## 2017-12-13 DIAGNOSIS — M1990 Unspecified osteoarthritis, unspecified site: Secondary | ICD-10-CM | POA: Diagnosis not present

## 2017-12-13 DIAGNOSIS — R55 Syncope and collapse: Secondary | ICD-10-CM | POA: Diagnosis not present

## 2017-12-13 DIAGNOSIS — R42 Dizziness and giddiness: Secondary | ICD-10-CM | POA: Diagnosis not present

## 2017-12-13 DIAGNOSIS — R310 Gross hematuria: Secondary | ICD-10-CM | POA: Diagnosis not present

## 2018-01-03 DIAGNOSIS — M48062 Spinal stenosis, lumbar region with neurogenic claudication: Secondary | ICD-10-CM | POA: Diagnosis not present

## 2018-01-03 DIAGNOSIS — M5416 Radiculopathy, lumbar region: Secondary | ICD-10-CM | POA: Diagnosis not present

## 2018-01-03 DIAGNOSIS — M5136 Other intervertebral disc degeneration, lumbar region: Secondary | ICD-10-CM | POA: Diagnosis not present

## 2018-01-24 DIAGNOSIS — M48062 Spinal stenosis, lumbar region with neurogenic claudication: Secondary | ICD-10-CM | POA: Diagnosis not present

## 2018-01-24 DIAGNOSIS — M5136 Other intervertebral disc degeneration, lumbar region: Secondary | ICD-10-CM | POA: Diagnosis not present

## 2018-01-24 DIAGNOSIS — M5416 Radiculopathy, lumbar region: Secondary | ICD-10-CM | POA: Diagnosis not present

## 2018-02-26 DIAGNOSIS — M48062 Spinal stenosis, lumbar region with neurogenic claudication: Secondary | ICD-10-CM | POA: Diagnosis not present

## 2018-02-26 DIAGNOSIS — M5136 Other intervertebral disc degeneration, lumbar region: Secondary | ICD-10-CM | POA: Diagnosis not present

## 2018-02-26 DIAGNOSIS — M5416 Radiculopathy, lumbar region: Secondary | ICD-10-CM | POA: Diagnosis not present

## 2018-04-03 DIAGNOSIS — M5136 Other intervertebral disc degeneration, lumbar region: Secondary | ICD-10-CM | POA: Diagnosis not present

## 2018-04-03 DIAGNOSIS — M48062 Spinal stenosis, lumbar region with neurogenic claudication: Secondary | ICD-10-CM | POA: Diagnosis not present

## 2018-04-03 DIAGNOSIS — M5416 Radiculopathy, lumbar region: Secondary | ICD-10-CM | POA: Diagnosis not present

## 2018-04-04 DIAGNOSIS — R55 Syncope and collapse: Secondary | ICD-10-CM | POA: Diagnosis not present

## 2018-04-04 DIAGNOSIS — R310 Gross hematuria: Secondary | ICD-10-CM | POA: Diagnosis not present

## 2018-04-04 DIAGNOSIS — D509 Iron deficiency anemia, unspecified: Secondary | ICD-10-CM | POA: Diagnosis not present

## 2018-04-04 DIAGNOSIS — R42 Dizziness and giddiness: Secondary | ICD-10-CM | POA: Diagnosis not present

## 2018-06-20 DIAGNOSIS — R55 Syncope and collapse: Secondary | ICD-10-CM | POA: Diagnosis not present

## 2018-06-20 DIAGNOSIS — D509 Iron deficiency anemia, unspecified: Secondary | ICD-10-CM | POA: Diagnosis not present

## 2018-06-20 DIAGNOSIS — R42 Dizziness and giddiness: Secondary | ICD-10-CM | POA: Diagnosis not present

## 2018-06-20 DIAGNOSIS — I10 Essential (primary) hypertension: Secondary | ICD-10-CM | POA: Diagnosis not present

## 2018-09-19 DIAGNOSIS — N182 Chronic kidney disease, stage 2 (mild): Secondary | ICD-10-CM | POA: Diagnosis not present

## 2018-09-19 DIAGNOSIS — Z0000 Encounter for general adult medical examination without abnormal findings: Secondary | ICD-10-CM | POA: Diagnosis not present

## 2018-09-19 DIAGNOSIS — R310 Gross hematuria: Secondary | ICD-10-CM | POA: Diagnosis not present

## 2018-09-19 DIAGNOSIS — D509 Iron deficiency anemia, unspecified: Secondary | ICD-10-CM | POA: Diagnosis not present

## 2018-09-19 DIAGNOSIS — R55 Syncope and collapse: Secondary | ICD-10-CM | POA: Diagnosis not present

## 2019-01-09 DIAGNOSIS — D509 Iron deficiency anemia, unspecified: Secondary | ICD-10-CM | POA: Diagnosis not present

## 2019-01-09 DIAGNOSIS — R5381 Other malaise: Secondary | ICD-10-CM | POA: Diagnosis not present

## 2019-01-09 DIAGNOSIS — E7849 Other hyperlipidemia: Secondary | ICD-10-CM | POA: Diagnosis not present

## 2019-01-09 DIAGNOSIS — I10 Essential (primary) hypertension: Secondary | ICD-10-CM | POA: Diagnosis not present

## 2019-01-09 DIAGNOSIS — N200 Calculus of kidney: Secondary | ICD-10-CM | POA: Diagnosis not present

## 2019-01-09 DIAGNOSIS — N182 Chronic kidney disease, stage 2 (mild): Secondary | ICD-10-CM | POA: Diagnosis not present

## 2019-01-09 DIAGNOSIS — D519 Vitamin B12 deficiency anemia, unspecified: Secondary | ICD-10-CM | POA: Diagnosis not present

## 2019-02-05 ENCOUNTER — Ambulatory Visit: Payer: Medicare HMO | Admitting: Gastroenterology

## 2019-02-26 ENCOUNTER — Other Ambulatory Visit: Payer: Self-pay

## 2019-02-27 ENCOUNTER — Inpatient Hospital Stay: Payer: Medicare HMO | Admitting: Oncology

## 2019-03-19 ENCOUNTER — Ambulatory Visit: Payer: Medicare HMO | Admitting: Gastroenterology

## 2019-04-03 ENCOUNTER — Encounter: Payer: Medicare HMO | Admitting: Oncology

## 2019-04-03 DIAGNOSIS — R55 Syncope and collapse: Secondary | ICD-10-CM | POA: Diagnosis not present

## 2019-04-03 DIAGNOSIS — R531 Weakness: Secondary | ICD-10-CM | POA: Diagnosis not present

## 2019-04-03 DIAGNOSIS — N182 Chronic kidney disease, stage 2 (mild): Secondary | ICD-10-CM | POA: Diagnosis not present

## 2019-04-03 DIAGNOSIS — R5382 Chronic fatigue, unspecified: Secondary | ICD-10-CM | POA: Diagnosis not present

## 2019-04-03 DIAGNOSIS — D509 Iron deficiency anemia, unspecified: Secondary | ICD-10-CM | POA: Diagnosis not present

## 2019-04-03 DIAGNOSIS — F0390 Unspecified dementia without behavioral disturbance: Secondary | ICD-10-CM | POA: Diagnosis not present

## 2019-04-03 DIAGNOSIS — I10 Essential (primary) hypertension: Secondary | ICD-10-CM | POA: Diagnosis not present

## 2019-04-09 DIAGNOSIS — R55 Syncope and collapse: Secondary | ICD-10-CM | POA: Diagnosis not present

## 2019-04-09 DIAGNOSIS — D509 Iron deficiency anemia, unspecified: Secondary | ICD-10-CM | POA: Diagnosis not present

## 2019-04-09 DIAGNOSIS — M1990 Unspecified osteoarthritis, unspecified site: Secondary | ICD-10-CM | POA: Diagnosis not present

## 2019-04-09 DIAGNOSIS — N183 Chronic kidney disease, stage 3 (moderate): Secondary | ICD-10-CM | POA: Diagnosis not present

## 2019-04-29 ENCOUNTER — Encounter: Payer: Medicare HMO | Admitting: Oncology

## 2019-04-30 ENCOUNTER — Ambulatory Visit: Payer: Medicare HMO | Admitting: Gastroenterology

## 2019-05-13 ENCOUNTER — Emergency Department (HOSPITAL_COMMUNITY): Payer: Medicare HMO

## 2019-05-13 ENCOUNTER — Encounter (HOSPITAL_COMMUNITY): Payer: Self-pay | Admitting: Emergency Medicine

## 2019-05-13 ENCOUNTER — Other Ambulatory Visit: Payer: Self-pay

## 2019-05-13 ENCOUNTER — Inpatient Hospital Stay (HOSPITAL_COMMUNITY)
Admission: EM | Admit: 2019-05-13 | Discharge: 2019-05-16 | DRG: 091 | Disposition: A | Discharge status: Home Health | Payer: Medicare HMO | Attending: Family Medicine | Admitting: Family Medicine

## 2019-05-13 DIAGNOSIS — R10814 Left lower quadrant abdominal tenderness: Secondary | ICD-10-CM | POA: Diagnosis not present

## 2019-05-13 DIAGNOSIS — Z87442 Personal history of urinary calculi: Secondary | ICD-10-CM | POA: Diagnosis not present

## 2019-05-13 DIAGNOSIS — Z8051 Family history of malignant neoplasm of kidney: Secondary | ICD-10-CM

## 2019-05-13 DIAGNOSIS — I1310 Hypertensive heart and chronic kidney disease without heart failure, with stage 1 through stage 4 chronic kidney disease, or unspecified chronic kidney disease: Secondary | ICD-10-CM | POA: Diagnosis present

## 2019-05-13 DIAGNOSIS — M25559 Pain in unspecified hip: Secondary | ICD-10-CM

## 2019-05-13 DIAGNOSIS — I6523 Occlusion and stenosis of bilateral carotid arteries: Secondary | ICD-10-CM | POA: Diagnosis not present

## 2019-05-13 DIAGNOSIS — Z7982 Long term (current) use of aspirin: Secondary | ICD-10-CM

## 2019-05-13 DIAGNOSIS — J189 Pneumonia, unspecified organism: Secondary | ICD-10-CM | POA: Diagnosis present

## 2019-05-13 DIAGNOSIS — Z888 Allergy status to other drugs, medicaments and biological substances status: Secondary | ICD-10-CM

## 2019-05-13 DIAGNOSIS — R29818 Other symptoms and signs involving the nervous system: Secondary | ICD-10-CM | POA: Diagnosis not present

## 2019-05-13 DIAGNOSIS — Z86018 Personal history of other benign neoplasm: Secondary | ICD-10-CM

## 2019-05-13 DIAGNOSIS — G92 Toxic encephalopathy: Principal | ICD-10-CM | POA: Diagnosis present

## 2019-05-13 DIAGNOSIS — Z1159 Encounter for screening for other viral diseases: Secondary | ICD-10-CM | POA: Diagnosis not present

## 2019-05-13 DIAGNOSIS — M25552 Pain in left hip: Secondary | ICD-10-CM | POA: Diagnosis not present

## 2019-05-13 DIAGNOSIS — R471 Dysarthria and anarthria: Secondary | ICD-10-CM | POA: Diagnosis present

## 2019-05-13 DIAGNOSIS — R404 Transient alteration of awareness: Secondary | ICD-10-CM | POA: Diagnosis not present

## 2019-05-13 DIAGNOSIS — Z79899 Other long term (current) drug therapy: Secondary | ICD-10-CM

## 2019-05-13 DIAGNOSIS — K5730 Diverticulosis of large intestine without perforation or abscess without bleeding: Secondary | ICD-10-CM | POA: Diagnosis not present

## 2019-05-13 DIAGNOSIS — N400 Enlarged prostate without lower urinary tract symptoms: Secondary | ICD-10-CM | POA: Diagnosis present

## 2019-05-13 DIAGNOSIS — G9341 Metabolic encephalopathy: Secondary | ICD-10-CM | POA: Diagnosis present

## 2019-05-13 DIAGNOSIS — I639 Cerebral infarction, unspecified: Secondary | ICD-10-CM | POA: Diagnosis not present

## 2019-05-13 DIAGNOSIS — I2510 Atherosclerotic heart disease of native coronary artery without angina pectoris: Secondary | ICD-10-CM

## 2019-05-13 DIAGNOSIS — R4781 Slurred speech: Secondary | ICD-10-CM | POA: Diagnosis not present

## 2019-05-13 DIAGNOSIS — R456 Violent behavior: Secondary | ICD-10-CM | POA: Diagnosis not present

## 2019-05-13 DIAGNOSIS — J181 Lobar pneumonia, unspecified organism: Secondary | ICD-10-CM | POA: Diagnosis not present

## 2019-05-13 DIAGNOSIS — R918 Other nonspecific abnormal finding of lung field: Secondary | ICD-10-CM | POA: Diagnosis not present

## 2019-05-13 DIAGNOSIS — G9340 Encephalopathy, unspecified: Secondary | ICD-10-CM | POA: Diagnosis present

## 2019-05-13 DIAGNOSIS — R29810 Facial weakness: Secondary | ICD-10-CM | POA: Diagnosis present

## 2019-05-13 DIAGNOSIS — N183 Chronic kidney disease, stage 3 (moderate): Secondary | ICD-10-CM | POA: Diagnosis present

## 2019-05-13 DIAGNOSIS — K219 Gastro-esophageal reflux disease without esophagitis: Secondary | ICD-10-CM | POA: Diagnosis present

## 2019-05-13 DIAGNOSIS — G459 Transient cerebral ischemic attack, unspecified: Secondary | ICD-10-CM | POA: Diagnosis not present

## 2019-05-13 DIAGNOSIS — E785 Hyperlipidemia, unspecified: Secondary | ICD-10-CM | POA: Diagnosis present

## 2019-05-13 DIAGNOSIS — S79912A Unspecified injury of left hip, initial encounter: Secondary | ICD-10-CM | POA: Diagnosis not present

## 2019-05-13 DIAGNOSIS — Z87891 Personal history of nicotine dependence: Secondary | ICD-10-CM | POA: Diagnosis not present

## 2019-05-13 DIAGNOSIS — Z9861 Coronary angioplasty status: Secondary | ICD-10-CM

## 2019-05-13 DIAGNOSIS — T7840XA Allergy, unspecified, initial encounter: Secondary | ICD-10-CM | POA: Diagnosis not present

## 2019-05-13 DIAGNOSIS — Z03818 Encounter for observation for suspected exposure to other biological agents ruled out: Secondary | ICD-10-CM | POA: Diagnosis not present

## 2019-05-13 LAB — I-STAT CHEM 8, ED
BUN: 22 mg/dL (ref 8–23)
Calcium, Ion: 1.23 mmol/L (ref 1.15–1.40)
Chloride: 106 mmol/L (ref 98–111)
Creatinine, Ser: 1.6 mg/dL — ABNORMAL HIGH (ref 0.61–1.24)
Glucose, Bld: 122 mg/dL — ABNORMAL HIGH (ref 70–99)
HCT: 33 % — ABNORMAL LOW (ref 39.0–52.0)
Hemoglobin: 11.2 g/dL — ABNORMAL LOW (ref 13.0–17.0)
Potassium: 3.9 mmol/L (ref 3.5–5.1)
Sodium: 141 mmol/L (ref 135–145)
TCO2: 25 mmol/L (ref 22–32)

## 2019-05-13 LAB — CBC
HCT: 33.9 % — ABNORMAL LOW (ref 39.0–52.0)
Hemoglobin: 11.3 g/dL — ABNORMAL LOW (ref 13.0–17.0)
MCH: 30.9 pg (ref 26.0–34.0)
MCHC: 33.3 g/dL (ref 30.0–36.0)
MCV: 92.6 fL (ref 80.0–100.0)
Platelets: 224 10*3/uL (ref 150–400)
RBC: 3.66 MIL/uL — ABNORMAL LOW (ref 4.22–5.81)
RDW: 12.8 % (ref 11.5–15.5)
WBC: 6.8 10*3/uL (ref 4.0–10.5)
nRBC: 0 % (ref 0.0–0.2)

## 2019-05-13 LAB — COMPREHENSIVE METABOLIC PANEL
ALT: 14 U/L (ref 0–44)
AST: 16 U/L (ref 15–41)
Albumin: 3.7 g/dL (ref 3.5–5.0)
Alkaline Phosphatase: 37 U/L — ABNORMAL LOW (ref 38–126)
Anion gap: 11 (ref 5–15)
BUN: 22 mg/dL (ref 8–23)
CO2: 23 mmol/L (ref 22–32)
Calcium: 9.6 mg/dL (ref 8.9–10.3)
Chloride: 107 mmol/L (ref 98–111)
Creatinine, Ser: 1.87 mg/dL — ABNORMAL HIGH (ref 0.61–1.24)
GFR calc Af Amer: 37 mL/min — ABNORMAL LOW (ref >60)
GFR calc non Af Amer: 32 mL/min — ABNORMAL LOW (ref >60)
Glucose, Bld: 130 mg/dL — ABNORMAL HIGH (ref 70–99)
Potassium: 3.9 mmol/L (ref 3.5–5.1)
Sodium: 141 mmol/L (ref 135–145)
Total Bilirubin: 0.9 mg/dL (ref 0.3–1.2)
Total Protein: 6.3 g/dL — ABNORMAL LOW (ref 6.5–8.1)

## 2019-05-13 LAB — URINALYSIS, ROUTINE W REFLEX MICROSCOPIC
Bacteria, UA: NONE SEEN
Bilirubin Urine: NEGATIVE
Glucose, UA: NEGATIVE mg/dL
Ketones, ur: 5 mg/dL — AB
Leukocytes,Ua: NEGATIVE
Nitrite: NEGATIVE
Protein, ur: 100 mg/dL — AB
Specific Gravity, Urine: 1.027 (ref 1.005–1.030)
pH: 5 (ref 5.0–8.0)

## 2019-05-13 LAB — DIFFERENTIAL
Abs Immature Granulocytes: 0.04 10*3/uL (ref 0.00–0.07)
Basophils Absolute: 0 10*3/uL (ref 0.0–0.1)
Basophils Relative: 0 %
Eosinophils Absolute: 0 10*3/uL (ref 0.0–0.5)
Eosinophils Relative: 0 %
Immature Granulocytes: 1 %
Lymphocytes Relative: 11 %
Lymphs Abs: 0.7 10*3/uL (ref 0.7–4.0)
Monocytes Absolute: 0.2 10*3/uL (ref 0.1–1.0)
Monocytes Relative: 2 %
Neutro Abs: 5.8 10*3/uL (ref 1.7–7.7)
Neutrophils Relative %: 86 %

## 2019-05-13 LAB — PROTIME-INR
INR: 1.2 (ref 0.8–1.2)
Prothrombin Time: 15.2 seconds (ref 11.4–15.2)

## 2019-05-13 LAB — LACTIC ACID, PLASMA: Lactic Acid, Venous: 1.9 mmol/L (ref 0.5–1.9)

## 2019-05-13 LAB — APTT: aPTT: 30 seconds (ref 24–36)

## 2019-05-13 LAB — TSH: TSH: 2.445 u[IU]/mL (ref 0.350–4.500)

## 2019-05-13 LAB — MAGNESIUM: Magnesium: 1.5 mg/dL — ABNORMAL LOW (ref 1.7–2.4)

## 2019-05-13 MED ORDER — MAGNESIUM SULFATE 2 GM/50ML IV SOLN
2.0000 g | Freq: Once | INTRAVENOUS | Status: AC
  Administered 2019-05-14: 2 g via INTRAVENOUS
  Filled 2019-05-13: qty 50

## 2019-05-13 MED ORDER — IOHEXOL 350 MG/ML SOLN
100.0000 mL | Freq: Once | INTRAVENOUS | Status: AC | PRN
  Administered 2019-05-13: 100 mL via INTRAVENOUS

## 2019-05-13 MED ORDER — SODIUM CHLORIDE 0.9% FLUSH: 3.0000 mL | Freq: Once | INTRAVENOUS | Status: DC

## 2019-05-13 MED ORDER — NALOXONE HCL 0.4 MG/ML IJ SOLN
0.4000 mg | INTRAMUSCULAR | Status: DC | PRN
  Administered 2019-05-13: 0.4 mg via INTRAVENOUS
  Filled 2019-05-13: qty 1

## 2019-05-13 NOTE — Consult Note (Signed)
MRI negative for stroke Recommend workup for encephalopathy     Requesting Physician: Dr. Dian Situ    Chief Complaint: Altered mental status  History obtained from: Chart and EMS  HPI:                                                                                                                                       Charles Sims is a 85 y.o. male with past medical history significant for coronary artery disease, hypertension, hyperlipidemia presents to the emergency room as a stroke alert.  Last seen normal was 4:30 PM by his wife.  Around 6 PM, the patient's wife noted patient was unresponsive lying in bed and called EMS.  On arrival they noticed his pupils were pinpoint, felt he may have had a left facial droop and that he was unresponsive.  Blood pressure initially greater than 601 systolic however improved on arrival and was 093 systolic.  ED course:  A stat CT head was obtained which is negative for hemorrhage.  CT angiogram was negative for large vessel occlusion.  On assessment patient was drowsy, but no focal motor deficits were appreciated.  A stat MRI brain was performed as patient was still within the TPA window, however was was negative for stroke.   Past Medical History:  Diagnosis Date  . Adenoma of right adrenal gland   . Arthritis   . Benign prostatic hypertrophy with lower urinary tract symptoms (LUTS)   . Bladder calculi   . Bradycardia   . CAD (coronary artery disease)   . Carotid stenosis   . Coronary artery disease   . Dysrhythmia    bradycardia  . Elevated PSA   . GERD (gastroesophageal reflux disease)   . Gross hematuria   . Hyperlipidemia   . Hypertension   . Prostatitis   . Shortness of breath dyspnea   . Urinary frequency     Past Surgical History:  Procedure Laterality Date  . CAROTID ENDARTERECTOMY Left   . CYSTOSCOPY WITH LITHOLAPAXY N/A 04/13/2015   Procedure: CYSTOSCOPY WITH LITHOLAPAXY;  Surgeon: Hollice Espy, MD;  Location: [facility] ORS;   Service: Urology;  Laterality: N/A;    Family History  Problem Relation Age of Onset  . Kidney cancer Father   . Kidney cancer Paternal Grandfather   . Alzheimer's disease Mother    Social History:  reports that he quit smoking about 14 years ago. He has never used smokeless tobacco. He reports that he does not drink alcohol or use drugs.  Allergies:  Allergies  Allergen Reactions  . Lisinopril Cough    Medications:  I reviewed home medications   ROS:                                                                                                                                     Unable to review systems due to patient's mental status   Examination:                                                                                                      General: Appears well-developed Psych: Affect appropriate to situation Eyes: No scleral injection HENT: No OP obstrucion Head: Normocephalic.  Cardiovascular: Normal rate and regular rhythm.  Respiratory: Effort normal and breath sounds normal to anterior ascultation GI: Soft.  No distension. There is no tenderness.  Skin: WDI    Neurological Examination Mental Status: Drowsy, not oriented to place, month or his age and unable to provide history speech fluent without evidence of aphasia.  Speech is dysarthric.  Not cooperative fully with commands. Cranial Nerves: II: Visual fields: No diminished blink to threat bilaterally, difficult to assess as patient not cooperative III,IV, VI: ptosis not present, extra-ocular motions intact bilaterally, pupils equal, round, reactive to light and accommodation V,VII: Face appears symmetric, unable to test facial sensation VIII: hearing normal bilaterally IX,X: uvula rises symmetrically XI: bilateral shoulder shrug XII: midline tongue extension Motor: Patient is  antigravity in all 4 extremities with equal strength Tone and bulk:normal tone throughout; no atrophy noted Sensory: Identifies touch bilaterally Plantars: Right: downgoing   Left: downgoing Cerebellar: No obvious ataxia on examination      Lab Results: Basic Metabolic Panel: Recent Labs  Lab 05/13/19 1933 05/13/19 1940  NA 141 141  K 3.9 3.9  CL 107 106  CO2 23  --   GLUCOSE 130* 122*  BUN 22 22  CREATININE 1.87* 1.60*  CALCIUM 9.6  --     CBC: Recent Labs  Lab 05/13/19 1933 05/13/19 1940  WBC 6.8  --   NEUTROABS 5.8  --   HGB 11.3* 11.2*  HCT 33.9* 33.0*  MCV 92.6  --   PLT 224  --     Coagulation Studies: Recent Labs    05/13/19 1933  LABPROT 15.2  INR 1.2    Imaging: Ct Code Stroke Cta Head W/wo Contrast  Result Date: 05/13/2019 CLINICAL DATA:  Initial evaluation for acute left-sided facial droop, slurred speech. EXAM: CT ANGIOGRAPHY HEAD AND NECK TECHNIQUE: Multidetector CT imaging of the head and neck was performed using the standard protocol during bolus  administration of intravenous contrast. Multiplanar CT image reconstructions and MIPs were obtained to evaluate the vascular anatomy. Carotid stenosis measurements (when applicable) are obtained utilizing NASCET criteria, using the distal internal carotid diameter as the denominator. CONTRAST:  139mL OMNIPAQUE IOHEXOL 350 MG/ML SOLN COMPARISON:  Prior noncontrast head CT from earlier the same day. FINDINGS: CTA NECK FINDINGS Aortic arch: Visualized aortic arch of normal caliber with normal branch pattern. Moderate atherosclerotic change noted about the aortic arch and origin of the great vessels. Short-segment stenosis of approximately 70% noted at the proximal left subclavian artery, prior to the takeoff of the left vertebral artery (series 7, image 306). Visualized subclavian arteries otherwise widely patent. Right carotid system: Right common carotid artery tortuous proximally but widely patent to the  bifurcation without stenosis. Mild atheromatous irregularity about the right bifurcation without hemodynamically significant stenosis. Right ICA widely patent distally to the skull base without stenosis, dissection, or occlusion. Left carotid system: Mild approximate 30% narrowing at the origin of the left common carotid artery. Left CCA otherwise widely patent to the bifurcation without stenosis. Postoperative changes from prior endarterectomy about the left bifurcation. No residual hemodynamically significant stenosis at the origin of the left ICA. Approximate 50% stenosis noted at the origin of the left ECA. Left ICA widely patent distally to the skull base without stenosis, dissection, or occlusion. Vertebral arteries: Both of the vertebral arteries arise from the subclavian arteries. Focal plaque at the origin of the left vertebral artery with severe at least 80% stenosis. Left vertebral artery slightly dominant. Mild scattered atheromatous irregularity within the V2 segments bilaterally with mild to moderate stenoses. Vertebral arteries otherwise widely patent within the neck without acute abnormality. Skeleton: No acute osseous finding. No discrete lytic or blastic osseous lesions. Moderate multilevel cervical spondylolysis at C3-4 through C6-7. Degenerative changes noted about the TMJs bilaterally, right worse than left. Other neck: Approximate 2 cm cystic lesion within the subcutaneous fat of the right posterior neck most likely reflects a sebaceous cyst. Soft tissues of the neck demonstrate no acute finding. Upper chest: Unremarkable. Review of the MIP images confirms the above findings CTA HEAD FINDINGS Anterior circulation: Petrous segments widely patent. Mild scattered atheromatous plaque within the cavernous/supraclinoid ICAs without hemodynamically significant stenosis. ICA termini well perfused. A1 segments patent bilaterally. Right A1 hypoplastic, accounting for the slightly diminutive right ICA is  compared to the left. Normal anterior communicating artery. Anterior cerebral arteries patent to their distal aspects without stenosis or occlusion. No M1 stenosis or occlusion. Normal MCA bifurcations. Distal MCA branches well perfused and symmetric. Posterior circulation: Vertebral arteries patent to the vertebrobasilar junction without stenosis. Left vertebral artery dominant. Posteroinferior cerebral arteries patent bilaterally. Basilar widely patent to its distal aspect without stenosis. Superior cerebral arteries patent bilaterally. Both of the posterior cerebral arteries primarily supplied via the basilar and are widely patent to their distal aspects. Venous sinuses: Grossly patent allowing for timing of the contrast bolus. Anatomic variants: None significant.  No intracranial aneurysm. Delayed phase: Not performed. Review of the MIP images confirms the above findings IMPRESSION: 1. Negative CTA for emergent large vessel occlusion. 2. Prior left carotid endarterectomy without residual or recurrent CCA/ICA stenosis. Persistent and/or recurrent 50% stenosis at the origin of the left ECA. 3. Short-segment severe at least 80% stenosis at the origin of the dominant left vertebral artery. Otherwise wide patency of the vertebrobasilar system. 4. Mild atherosclerotic change about the carotid siphons without hemodynamically significant or correctable stenosis. These results were communicated to Dr. Lorraine Lax  at 8:10 pmon 7/8/2020by text page via the [facility] messaging system. Electronically Signed   By: Jeannine Boga M.D.   On: 05/13/2019 20:38   Ct Code Stroke Cta Neck W/wo Contrast  Result Date: 05/13/2019 CLINICAL DATA:  Initial evaluation for acute left-sided facial droop, slurred speech. EXAM: CT ANGIOGRAPHY HEAD AND NECK TECHNIQUE: Multidetector CT imaging of the head and neck was performed using the standard protocol during bolus administration of intravenous contrast. Multiplanar CT image reconstructions  and MIPs were obtained to evaluate the vascular anatomy. Carotid stenosis measurements (when applicable) are obtained utilizing NASCET criteria, using the distal internal carotid diameter as the denominator. CONTRAST:  169mL OMNIPAQUE IOHEXOL 350 MG/ML SOLN COMPARISON:  Prior noncontrast head CT from earlier the same day. FINDINGS: CTA NECK FINDINGS Aortic arch: Visualized aortic arch of normal caliber with normal branch pattern. Moderate atherosclerotic change noted about the aortic arch and origin of the great vessels. Short-segment stenosis of approximately 70% noted at the proximal left subclavian artery, prior to the takeoff of the left vertebral artery (series 7, image 306). Visualized subclavian arteries otherwise widely patent. Right carotid system: Right common carotid artery tortuous proximally but widely patent to the bifurcation without stenosis. Mild atheromatous irregularity about the right bifurcation without hemodynamically significant stenosis. Right ICA widely patent distally to the skull base without stenosis, dissection, or occlusion. Left carotid system: Mild approximate 30% narrowing at the origin of the left common carotid artery. Left CCA otherwise widely patent to the bifurcation without stenosis. Postoperative changes from prior endarterectomy about the left bifurcation. No residual hemodynamically significant stenosis at the origin of the left ICA. Approximate 50% stenosis noted at the origin of the left ECA. Left ICA widely patent distally to the skull base without stenosis, dissection, or occlusion. Vertebral arteries: Both of the vertebral arteries arise from the subclavian arteries. Focal plaque at the origin of the left vertebral artery with severe at least 80% stenosis. Left vertebral artery slightly dominant. Mild scattered atheromatous irregularity within the V2 segments bilaterally with mild to moderate stenoses. Vertebral arteries otherwise widely patent within the neck without  acute abnormality. Skeleton: No acute osseous finding. No discrete lytic or blastic osseous lesions. Moderate multilevel cervical spondylolysis at C3-4 through C6-7. Degenerative changes noted about the TMJs bilaterally, right worse than left. Other neck: Approximate 2 cm cystic lesion within the subcutaneous fat of the right posterior neck most likely reflects a sebaceous cyst. Soft tissues of the neck demonstrate no acute finding. Upper chest: Unremarkable. Review of the MIP images confirms the above findings CTA HEAD FINDINGS Anterior circulation: Petrous segments widely patent. Mild scattered atheromatous plaque within the cavernous/supraclinoid ICAs without hemodynamically significant stenosis. ICA termini well perfused. A1 segments patent bilaterally. Right A1 hypoplastic, accounting for the slightly diminutive right ICA is compared to the left. Normal anterior communicating artery. Anterior cerebral arteries patent to their distal aspects without stenosis or occlusion. No M1 stenosis or occlusion. Normal MCA bifurcations. Distal MCA branches well perfused and symmetric. Posterior circulation: Vertebral arteries patent to the vertebrobasilar junction without stenosis. Left vertebral artery dominant. Posteroinferior cerebral arteries patent bilaterally. Basilar widely patent to its distal aspect without stenosis. Superior cerebral arteries patent bilaterally. Both of the posterior cerebral arteries primarily supplied via the basilar and are widely patent to their distal aspects. Venous sinuses: Grossly patent allowing for timing of the contrast bolus. Anatomic variants: None significant.  No intracranial aneurysm. Delayed phase: Not performed. Review of the MIP images confirms the above findings IMPRESSION: 1. Negative  CTA for emergent large vessel occlusion. 2. Prior left carotid endarterectomy without residual or recurrent CCA/ICA stenosis. Persistent and/or recurrent 50% stenosis at the origin of the left  ECA. 3. Short-segment severe at least 80% stenosis at the origin of the dominant left vertebral artery. Otherwise wide patency of the vertebrobasilar system. 4. Mild atherosclerotic change about the carotid siphons without hemodynamically significant or correctable stenosis. These results were communicated to Dr. Lorraine Lax at [place] 7/8/2020by text page via the [facility] messaging system. Electronically Signed   By: Jeannine Boga M.D.   On: 05/13/2019 20:38   Ct Head Code Stroke Wo Contrast  Result Date: 05/13/2019 CLINICAL DATA:  Code stroke. Radiologist Dr. Maeola Harman of [facility] cannot cough and back item distension stroke arm yet is yet apparent again next month by EXAM: CT HEAD WITHOUT CONTRAST TECHNIQUE: Contiguous axial images were obtained from the base of the skull through the vertex without intravenous contrast. COMPARISON:  None available. FINDINGS: Brain: Generalized age-related cerebral atrophy with moderate chronic microvascular ischemic disease. No acute intracranial hemorrhage. No acute large vessel territory infarct. No mass lesion, midline shift or mass effect. No hydrocephalus. No extra-axial fluid collection. Vascular: No hyperdense vessel. Calcified atherosclerosis at the skull base. Skull: Scalp soft tissues and calvarium within normal limits. Sinuses/Orbits: Globes and orbital soft tissues within normal limits. Mild scattered mucosal thickening within the ethmoidal air cells and maxillary sinuses. Paranasal sinuses are otherwise clear. No mastoid effusion. Other: None. ASPECTS [facility] Stroke Program Early CT Score) - Ganglionic level infarction (caudate, lentiform nuclei, internal capsule, insula, M1-M3 cortex): 7 - Supraganglionic infarction (M4-M6 cortex): 3 Total score (0-10 with 10 being normal): 10 IMPRESSION: 1. No acute intracranial infarct or other abnormality identified. 2. ASPECTS is 10. 3. Age-related cerebral atrophy with moderate chronic microvascular ischemic disease. These  results were communicated to Dr. Lorraine Lax at 7:52 pmon 7/8/2020by text page via the [facility] messaging system. Electronically Signed   By: Jeannine Boga M.D.   On: 05/13/2019 19:54     ASSESSMENT AND PLAN  85 y.o. male with past medical history significant for coronary artery disease, hypertension, hyperlipidemia presents to the emergency room as a stroke alert for altered mental status. Stroke work-up negative, unlikely this is a TIA.  Suspect toxic metabolic encephalopathy in the setting of possible lower lobe pneumonia, urinalysis still pending.  UDS still pending.  Seizure possibility however no witnessed seizures.  Can obtain routine EEG in the morning  Of note MRI brain shows significant atrophy and it is possible patient may be at increased susceptibility to delirium.   Acute metabolic/toxic encephalopathy Suspect poor cognitive baseline given significant atrophy noted on MRI brain which may be contributing to patient's altered mental status Possible seizure   Recommendations Chest x-ray concerning for lower lobe pneumonia. management per hospitalist Check ammonia, CBC, BMP Urine analysis pending Urine drug screens pending Routine EEG in the morning    Telesa Jeancharles [facility] Pager Number [phone]

## 2019-05-13 NOTE — ED Triage Notes (Signed)
Last seen normal at 1630.  Wife found pt in bed, confused and would not respond verbally and called EMS. EMS reportst pt would not respond or follow commands upon arrival.

## 2019-05-13 NOTE — ED Notes (Signed)
Charles Sims pts son [phone], call with pt update

## 2019-05-13 NOTE — ED Provider Notes (Addendum)
[facility] EMERGENCY DEPARTMENT Provider Note   CSN: [id number] Arrival date & time: 05/13/19  1928    History   Chief Complaint Chief Complaint  Patient presents with   Altered Mental Status    HPI Charles Sims is a 85 y.o. male.    The history is provided by medical records and the EMS personnel.  Altered Mental Status Presenting symptoms: confusion and disorientation   Severity:  Moderate Most recent episode:  Today Episode history:  Single Duration:  3 hours Timing:  Constant Progression:  Unchanged Chronicity:  New Associated symptoms: no slurred speech      Patient is a 85 year old male with past medical history of CAD, GERD, HTN, HLD, BPH who arrives to the ED today via EMS as a stroke alert.  EMS reports patient was last seen normal around 4:30 PM today by his wife.  Then, at around 6 PM, the patient's wife found the patient lying in bed unresponsive and called EMS.  On arrival to the home EMS reported the patient's pupils were pinpoint, that he had some movement of his extremities, but did not follow commands and he was found to be hypertensive with systolic greater than 856 and there was questionable left-sided facial droop.   Past Medical History:  Diagnosis Date   Adenoma of right adrenal gland    Arthritis    Benign prostatic hypertrophy with lower urinary tract symptoms (LUTS)    Bladder calculi    Bradycardia    CAD (coronary artery disease)    Carotid stenosis    Coronary artery disease    Dysrhythmia    bradycardia   Elevated PSA    GERD (gastroesophageal reflux disease)    Gross hematuria    Hyperlipidemia    Hypertension    Prostatitis    Shortness of breath dyspnea    Urinary frequency     Patient Active Problem List   Diagnosis Date Noted   Bradycardia, sinus 02/13/2014   Chest pain 02/13/2014   Dizziness 02/13/2014   Breathlessness on exertion 02/13/2014   BP (high blood pressure) 02/13/2014     Carotid stenosis 02/13/2014   Benign prostatic hyperplasia with urinary obstruction 11/03/2012   Chronic prostatitis 11/03/2012   Abnormal prostate specific antigen 11/03/2012   Prostatitis 11/03/2012   Urgency of micturation 11/03/2012   FOM (frequency of micturition) 11/03/2012   CAD S/P percutaneous coronary angioplasty 06/04/2012    Past Surgical History:  Procedure Laterality Date   CAROTID ENDARTERECTOMY Left    CYSTOSCOPY WITH LITHOLAPAXY N/A 04/13/2015   Procedure: CYSTOSCOPY WITH LITHOLAPAXY;  Surgeon: Hollice Espy, MD;  Location: [facility] ORS;  Service: Urology;  Laterality: N/A;        Home Medications    Prior to Admission medications   Medication Sig Start Date End Date Taking? Authorizing Provider  aspirin EC 81 MG tablet Take 81 mg by mouth daily.    [provider]  bismuth subsalicylate (PEPTO BISMOL) 262 MG/15ML suspension Take 45 mLs by mouth once. 04/13/15   [provider]  doxazosin (CARDURA) 4 MG tablet Take 4 mg by mouth daily.    [provider]  loperamide (IMODIUM A-D) 2 MG tablet Take 2 mg by mouth daily.    [provider]  losartan-hydrochlorothiazide (HYZAAR) 50-12.5 MG per tablet Take 1 tablet by mouth daily.    [provider]  meloxicam (MOBIC) 15 MG tablet  05/17/15   [provider]  omeprazole (PRILOSEC) 20 MG capsule Take  20 mg by mouth daily.    [provider]  simvastatin (ZOCOR) 20 MG tablet Take 20 mg by mouth daily.    [provider]  traMADol Veatrice Bourbon) 50 MG tablet  05/18/15   [provider]    Family History Family History  Problem Relation Age of Onset   Kidney cancer Father    Kidney cancer Paternal Grandfather    Alzheimer's disease Mother     Social History Social History   Tobacco Use   Smoking status: Former Smoker    Quit date: 04/04/2005    Years since quitting: 14.1   Smokeless tobacco: Never Used  Substance Use Topics    Alcohol use: No   Drug use: No     Allergies   Lisinopril   Review of Systems Review of Systems  Unable to perform ROS: Mental status change  Psychiatric/Behavioral: Positive for confusion.     Physical Exam Updated Vital Signs BP (!) 181/75 (BP Location: Left Arm)    Pulse 60    Temp 97.6 F (36.4 C) (Rectal)    Resp 16    Wt 87.4 kg    SpO2 95%    BMI 29.30 kg/m   Physical Exam Vitals signs and nursing note reviewed.  HENT:     Head: Normocephalic and atraumatic.     Right Ear: External ear normal.     Left Ear: External ear normal.     Nose: Nose normal.     Mouth/Throat:     Mouth: Mucous membranes are moist.  Eyes:     General: Lids are normal. Gaze aligned appropriately.     Pupils: Pupils are equal, round, and reactive to light.     Comments: 65mm  Cardiovascular:     Rate and Rhythm: Normal rate and regular rhythm.     Pulses: Normal pulses.  Pulmonary:     Effort: Pulmonary effort is normal. No respiratory distress.  Abdominal:     General: Abdomen is flat.     Tenderness: There is no abdominal tenderness. There is no guarding.  Skin:    General: Skin is warm and dry.     Capillary Refill: Capillary refill takes less than 2 seconds.  Neurological:     Mental Status: He is disoriented and confused.     GCS: GCS eye subscore is 3. GCS verbal subscore is 3. GCS motor subscore is 5.     Cranial Nerves: Dysarthria present. No cranial nerve deficit or facial asymmetry.     Motor: Weakness present.     Comments: Gross confusion with waxing and waning response to commands during neuro exam.  He is oriented to self, though not to place, month or year.  Cranial nerve testing is difficult as patient does not entirely follow commands. CNII: unable to assess visual fields CN III/IV/VI: Pupils equal, round, reactive to light, extraocular movements are grossly intact by visualization of patient following provider around the room. CN V: does not respond to assessment  of light touch sensation CN VII: facial muscles appear symmetric without notable facial droop CN VIII: hearing grossly intact CN IX, X: uvula rises in midline C XI: shoulder shrug is symmetric CN XII: tongue protrudes in midline      ED Treatments / Results  Labs (all labs ordered are listed, but only abnormal results are displayed) Labs Reviewed  CBC - Abnormal; Notable for the following components:      Result Value   RBC 3.66 (*)  Hemoglobin 11.3 (*)    HCT 33.9 (*)    All other components within normal limits  COMPREHENSIVE METABOLIC PANEL - Abnormal; Notable for the following components:   Glucose, Bld 130 (*)    Creatinine, Ser 1.87 (*)    Total Protein 6.3 (*)    Alkaline Phosphatase 37 (*)    GFR calc non Af Amer 32 (*)    GFR calc Af Amer 37 (*)    All other components within normal limits  URINALYSIS, ROUTINE W REFLEX MICROSCOPIC - Abnormal; Notable for the following components:   Hgb urine dipstick MODERATE (*)    Ketones, ur 5 (*)    Protein, ur 100 (*)    All other components within normal limits  MAGNESIUM - Abnormal; Notable for the following components:   Magnesium 1.5 (*)    All other components within normal limits  AMMONIA - Abnormal; Notable for the following components:   Ammonia 38 (*)    All other components within normal limits  I-STAT CHEM 8, ED - Abnormal; Notable for the following components:   Creatinine, Ser 1.60 (*)    Glucose, Bld 122 (*)    Hemoglobin 11.2 (*)    HCT 33.0 (*)    All other components within normal limits  SARS CORONAVIRUS 2 (HOSPITAL ORDER, [facility])  PROTIME-INR  APTT  DIFFERENTIAL  LACTIC ACID, PLASMA  TSH  LACTIC ACID, PLASMA  PROCALCITONIN  HIV ANTIBODY (ROUTINE TESTING W REFLEX)  CBC  COMPREHENSIVE METABOLIC PANEL  TROPONIN I (HIGH SENSITIVITY)  CK  CBG MONITORING, ED    EKG None    Radiology Ct Code Stroke Cta Head W/wo Contrast  Result Date: 05/13/2019 CLINICAL  DATA:  Initial evaluation for acute left-sided facial droop, slurred speech. EXAM: CT ANGIOGRAPHY HEAD AND NECK TECHNIQUE: Multidetector CT imaging of the head and neck was performed using the standard protocol during bolus administration of intravenous contrast. Multiplanar CT image reconstructions and MIPs were obtained to evaluate the vascular anatomy. Carotid stenosis measurements (when applicable) are obtained utilizing NASCET criteria, using the distal internal carotid diameter as the denominator. CONTRAST:  121mL OMNIPAQUE IOHEXOL 350 MG/ML SOLN COMPARISON:  Prior noncontrast head CT from earlier the same day. FINDINGS: CTA NECK FINDINGS Aortic arch: Visualized aortic arch of normal caliber with normal branch pattern. Moderate atherosclerotic change noted about the aortic arch and origin of the great vessels. Short-segment stenosis of approximately 70% noted at the proximal left subclavian artery, prior to the takeoff of the left vertebral artery (series 7, image 306). Visualized subclavian arteries otherwise widely patent. Right carotid system: Right common carotid artery tortuous proximally but widely patent to the bifurcation without stenosis. Mild atheromatous irregularity about the right bifurcation without hemodynamically significant stenosis. Right ICA widely patent distally to the skull base without stenosis, dissection, or occlusion. Left carotid system: Mild approximate 30% narrowing at the origin of the left common carotid artery. Left CCA otherwise widely patent to the bifurcation without stenosis. Postoperative changes from prior endarterectomy about the left bifurcation. No residual hemodynamically significant stenosis at the origin of the left ICA. Approximate 50% stenosis noted at the origin of the left ECA. Left ICA widely patent distally to the skull base without stenosis, dissection, or occlusion. Vertebral arteries: Both of the vertebral arteries arise from the subclavian arteries. Focal  plaque at the origin of the left vertebral artery with severe at least 80% stenosis. Left vertebral artery slightly dominant. Mild scattered atheromatous irregularity within the V2  segments bilaterally with mild to moderate stenoses. Vertebral arteries otherwise widely patent within the neck without acute abnormality. Skeleton: No acute osseous finding. No discrete lytic or blastic osseous lesions. Moderate multilevel cervical spondylolysis at C3-4 through C6-7. Degenerative changes noted about the TMJs bilaterally, right worse than left. Other neck: Approximate 2 cm cystic lesion within the subcutaneous fat of the right posterior neck most likely reflects a sebaceous cyst. Soft tissues of the neck demonstrate no acute finding. Upper chest: Unremarkable. Review of the MIP images confirms the above findings CTA HEAD FINDINGS Anterior circulation: Petrous segments widely patent. Mild scattered atheromatous plaque within the cavernous/supraclinoid ICAs without hemodynamically significant stenosis. ICA termini well perfused. A1 segments patent bilaterally. Right A1 hypoplastic, accounting for the slightly diminutive right ICA is compared to the left. Normal anterior communicating artery. Anterior cerebral arteries patent to their distal aspects without stenosis or occlusion. No M1 stenosis or occlusion. Normal MCA bifurcations. Distal MCA branches well perfused and symmetric. Posterior circulation: Vertebral arteries patent to the vertebrobasilar junction without stenosis. Left vertebral artery dominant. Posteroinferior cerebral arteries patent bilaterally. Basilar widely patent to its distal aspect without stenosis. Superior cerebral arteries patent bilaterally. Both of the posterior cerebral arteries primarily supplied via the basilar and are widely patent to their distal aspects. Venous sinuses: Grossly patent allowing for timing of the contrast bolus. Anatomic variants: None significant.  No intracranial aneurysm.  Delayed phase: Not performed. Review of the MIP images confirms the above findings IMPRESSION: 1. Negative CTA for emergent large vessel occlusion. 2. Prior left carotid endarterectomy without residual or recurrent CCA/ICA stenosis. Persistent and/or recurrent 50% stenosis at the origin of the left ECA. 3. Short-segment severe at least 80% stenosis at the origin of the dominant left vertebral artery. Otherwise wide patency of the vertebrobasilar system. 4. Mild atherosclerotic change about the carotid siphons without hemodynamically significant or correctable stenosis. These results were communicated to Dr. Lorraine Lax at [place] 7/8/2020by text page via the [facility] messaging system. Electronically Signed   By: Jeannine Boga M.D.   On: 05/13/2019 20:38   Ct Code Stroke Cta Neck W/wo Contrast  Result Date: 05/13/2019 CLINICAL DATA:  Initial evaluation for acute left-sided facial droop, slurred speech. EXAM: CT ANGIOGRAPHY HEAD AND NECK TECHNIQUE: Multidetector CT imaging of the head and neck was performed using the standard protocol during bolus administration of intravenous contrast. Multiplanar CT image reconstructions and MIPs were obtained to evaluate the vascular anatomy. Carotid stenosis measurements (when applicable) are obtained utilizing NASCET criteria, using the distal internal carotid diameter as the denominator. CONTRAST:  170mL OMNIPAQUE IOHEXOL 350 MG/ML SOLN COMPARISON:  Prior noncontrast head CT from earlier the same day. FINDINGS: CTA NECK FINDINGS Aortic arch: Visualized aortic arch of normal caliber with normal branch pattern. Moderate atherosclerotic change noted about the aortic arch and origin of the great vessels. Short-segment stenosis of approximately 70% noted at the proximal left subclavian artery, prior to the takeoff of the left vertebral artery (series 7, image 306). Visualized subclavian arteries otherwise widely patent. Right carotid system: Right common carotid artery tortuous  proximally but widely patent to the bifurcation without stenosis. Mild atheromatous irregularity about the right bifurcation without hemodynamically significant stenosis. Right ICA widely patent distally to the skull base without stenosis, dissection, or occlusion. Left carotid system: Mild approximate 30% narrowing at the origin of the left common carotid artery. Left CCA otherwise widely patent to the bifurcation without stenosis. Postoperative changes from prior endarterectomy about the left bifurcation. No residual hemodynamically significant  stenosis at the origin of the left ICA. Approximate 50% stenosis noted at the origin of the left ECA. Left ICA widely patent distally to the skull base without stenosis, dissection, or occlusion. Vertebral arteries: Both of the vertebral arteries arise from the subclavian arteries. Focal plaque at the origin of the left vertebral artery with severe at least 80% stenosis. Left vertebral artery slightly dominant. Mild scattered atheromatous irregularity within the V2 segments bilaterally with mild to moderate stenoses. Vertebral arteries otherwise widely patent within the neck without acute abnormality. Skeleton: No acute osseous finding. No discrete lytic or blastic osseous lesions. Moderate multilevel cervical spondylolysis at C3-4 through C6-7. Degenerative changes noted about the TMJs bilaterally, right worse than left. Other neck: Approximate 2 cm cystic lesion within the subcutaneous fat of the right posterior neck most likely reflects a sebaceous cyst. Soft tissues of the neck demonstrate no acute finding. Upper chest: Unremarkable. Review of the MIP images confirms the above findings CTA HEAD FINDINGS Anterior circulation: Petrous segments widely patent. Mild scattered atheromatous plaque within the cavernous/supraclinoid ICAs without hemodynamically significant stenosis. ICA termini well perfused. A1 segments patent bilaterally. Right A1 hypoplastic, accounting for  the slightly diminutive right ICA is compared to the left. Normal anterior communicating artery. Anterior cerebral arteries patent to their distal aspects without stenosis or occlusion. No M1 stenosis or occlusion. Normal MCA bifurcations. Distal MCA branches well perfused and symmetric. Posterior circulation: Vertebral arteries patent to the vertebrobasilar junction without stenosis. Left vertebral artery dominant. Posteroinferior cerebral arteries patent bilaterally. Basilar widely patent to its distal aspect without stenosis. Superior cerebral arteries patent bilaterally. Both of the posterior cerebral arteries primarily supplied via the basilar and are widely patent to their distal aspects. Venous sinuses: Grossly patent allowing for timing of the contrast bolus. Anatomic variants: None significant.  No intracranial aneurysm. Delayed phase: Not performed. Review of the MIP images confirms the above findings IMPRESSION: 1. Negative CTA for emergent large vessel occlusion. 2. Prior left carotid endarterectomy without residual or recurrent CCA/ICA stenosis. Persistent and/or recurrent 50% stenosis at the origin of the left ECA. 3. Short-segment severe at least 80% stenosis at the origin of the dominant left vertebral artery. Otherwise wide patency of the vertebrobasilar system. 4. Mild atherosclerotic change about the carotid siphons without hemodynamically significant or correctable stenosis. These results were communicated to Dr. Lorraine Lax at [place] 7/8/2020by text page via the [facility] messaging system. Electronically Signed   By: Jeannine Boga M.D.   On: 05/13/2019 20:38   Mr Brain Wo Contrast  Result Date: 05/13/2019 CLINICAL DATA:  Initial evaluation for acute stroke, slurred speech. EXAM: MRI HEAD WITHOUT CONTRAST TECHNIQUE: Multiplanar, multiecho pulse sequences of the brain and surrounding structures were obtained without intravenous contrast. COMPARISON:  Prior CT and CTA from earlier same day.  FINDINGS: Brain: Examination technically limited as the patient was unable to tolerate the full length of the exam. Diffusion-weighted imaging, axial T2, FLAIR, and gradient echo sequences, as well as coronal T2 sequences only were performed. Additionally, images are markedly degraded by motion artifact. Generalized age-related cerebral atrophy with moderate chronic microvascular ischemic disease. No abnormal foci of restricted diffusion to suggest acute or subacute ischemia on this motion degraded exam. Gray-white matter differentiation maintained. No areas of remote cortical infarction. No evidence for acute intracranial hemorrhage. Single punctate chronic microhemorrhage noted at the anterior left frontal region, of doubtful significance in isolation. No mass lesion, midline shift or mass effect. No hydrocephalus. No extra-axial fluid collection. Vascular: Major intracranial vascular  flow voids are maintained. Skull and upper cervical spine: Grossly negative on this limited exam. Sinuses/Orbits: Patient status post bilateral ocular lens replacement. Paranasal sinuses are largely clear. No mastoid effusion. Other: None. IMPRESSION: 1. Technically limited exam due to patient's inability to tolerate the full length of the study as well as motion artifact. 2. No acute intracranial infarct or other abnormality. 3. Age-related cerebral atrophy with moderate chronic microvascular ischemic disease. Electronically Signed   By: Jeannine Boga M.D.   On: 05/13/2019 21:07   Ct Head Code Stroke Wo Contrast  Result Date: 05/13/2019 CLINICAL DATA:  Code stroke. Radiologist Dr. Maeola Harman of [facility] cannot cough and back item distension stroke arm yet is yet apparent again next month by EXAM: CT HEAD WITHOUT CONTRAST TECHNIQUE: Contiguous axial images were obtained from the base of the skull through the vertex without intravenous contrast. COMPARISON:  None available. FINDINGS: Brain: Generalized age-related cerebral atrophy  with moderate chronic microvascular ischemic disease. No acute intracranial hemorrhage. No acute large vessel territory infarct. No mass lesion, midline shift or mass effect. No hydrocephalus. No extra-axial fluid collection. Vascular: No hyperdense vessel. Calcified atherosclerosis at the skull base. Skull: Scalp soft tissues and calvarium within normal limits. Sinuses/Orbits: Globes and orbital soft tissues within normal limits. Mild scattered mucosal thickening within the ethmoidal air cells and maxillary sinuses. Paranasal sinuses are otherwise clear. No mastoid effusion. Other: None. ASPECTS [facility] Stroke Program Early CT Score) - Ganglionic level infarction (caudate, lentiform nuclei, internal capsule, insula, M1-M3 cortex): 7 - Supraganglionic infarction (M4-M6 cortex): 3 Total score (0-10 with 10 being normal): 10 IMPRESSION: 1. No acute intracranial infarct or other abnormality identified. 2. ASPECTS is 10. 3. Age-related cerebral atrophy with moderate chronic microvascular ischemic disease. These results were communicated to Dr. Lorraine Lax at 7:52 [facility] 7/8/2020by text page via the [facility] messaging system. Electronically Signed   By: Jeannine Boga M.D.   On: 05/13/2019 19:54    Procedures Procedures (including critical care time)  Medications Ordered in ED Medications  sodium chloride flush (NS) 0.9 % injection 3 mL (0 mLs Intravenous Hold 05/13/19 2202)  naloxone [facility]) injection 0.4 mg (0.4 mg Intravenous Given 05/13/19 2142)  magnesium sulfate IVPB 2 g 50 mL (has no administration in time range)  iohexol (OMNIPAQUE) 350 MG/ML injection 100 mL (100 mLs Intravenous Contrast Given 05/13/19 2001)     Initial Impression / Assessment and Plan / ED Course  I have reviewed the triage vital signs and the nursing notes.  Pertinent labs & imaging results that were available during my care of the patient were reviewed by me and considered in my medical decision making (see chart for  details).  Differentials considered: Global encephalopathy (infectious, metabolic, toxicologic), CVA, TIA, [facility]   EM Physician interpretation of Labs:   INR 1.2  CMP significant for elevated serum creatinine of 1.87, it appears patient's baseline is around 1.4  CBC grossly unremarkable, mild stable anemia.   CT head without contrast with no obvious acute intracranial abnormality CTA head with no obvious emergent large vessel occlusion MR brain without contrast with no obvious acute intracranial abnormality.  Medical Decision Making:  PSALM SCHAPPELL is a 85 y.o. male past medical history as described above who presented to the emergency department by EMS from home for evaluation as a code stroke.  Neurology-stroke attending was available and in the ED prior to the arrival of the patient.  The patient was brought directly to the [facility] where he was evaluated further by  Dr. Lorraine Lax (Neurology).  Patient underwent stroke work-up including CT head without, CTA head and subsequent MRI brain, which showed no evidence of acute stroke.  Neurology recommended patient undergo further and continued evaluation for global encephalopathy, and admission.  No evidence or report of seizure activity.  He was afebrile on arrival and no leukocytosis, though plain film chest x-ray is concerning for possible left lower lobe pneumonia.  Narcan 0.4mg  x2 given due to his continued AMS, negative stroke workup and pupillary constriction. No response to narcan. Otherwise, no obvious source found for the patient's acute encephalopathic state at this time.    Magnesium (1.5) repleted. UA without evidence of UTI.   Hospitalist consulted for admission as patient has not returned to his baseline and will require further inpatient monitoring and work-up.  The plan for this patient was discussed with my attending physician, Dr. Merrily Pew, who voiced agreement and who oversaw evaluation and treatment of this patient.     Final Clinical Impressions(s) / ED Diagnoses   Final diagnoses:  Acute encephalopathy    ED Discharge Orders    None     Disposition: Admit  Brienne Liguori A. Jimmye Norman, MD Resident Physician, PGY-3 Emergency Medicine [facility]     Jefm Petty, MD 05/14/19 2330    Merrily Pew, MD 05/16/19 [phone]

## 2019-05-13 NOTE — ED Notes (Signed)
On rounding, pt restless, trying to get out of bed. Impulsive, assisted pt to stand on the side of the bed, pt urinated on floor. Unable to follow commands.

## 2019-05-14 ENCOUNTER — Inpatient Hospital Stay (HOSPITAL_COMMUNITY): Payer: Medicare HMO

## 2019-05-14 DIAGNOSIS — Z86018 Personal history of other benign neoplasm: Secondary | ICD-10-CM | POA: Diagnosis not present

## 2019-05-14 DIAGNOSIS — Z8051 Family history of malignant neoplasm of kidney: Secondary | ICD-10-CM | POA: Diagnosis not present

## 2019-05-14 DIAGNOSIS — R10814 Left lower quadrant abdominal tenderness: Secondary | ICD-10-CM | POA: Diagnosis not present

## 2019-05-14 DIAGNOSIS — J181 Lobar pneumonia, unspecified organism: Secondary | ICD-10-CM

## 2019-05-14 DIAGNOSIS — E785 Hyperlipidemia, unspecified: Secondary | ICD-10-CM | POA: Diagnosis present

## 2019-05-14 DIAGNOSIS — N183 Chronic kidney disease, stage 3 (moderate): Secondary | ICD-10-CM | POA: Diagnosis present

## 2019-05-14 DIAGNOSIS — Z79899 Other long term (current) drug therapy: Secondary | ICD-10-CM | POA: Diagnosis not present

## 2019-05-14 DIAGNOSIS — N400 Enlarged prostate without lower urinary tract symptoms: Secondary | ICD-10-CM | POA: Diagnosis present

## 2019-05-14 DIAGNOSIS — Z87442 Personal history of urinary calculi: Secondary | ICD-10-CM | POA: Diagnosis not present

## 2019-05-14 DIAGNOSIS — R29810 Facial weakness: Secondary | ICD-10-CM | POA: Diagnosis present

## 2019-05-14 DIAGNOSIS — J189 Pneumonia, unspecified organism: Secondary | ICD-10-CM | POA: Diagnosis present

## 2019-05-14 DIAGNOSIS — G9340 Encephalopathy, unspecified: Secondary | ICD-10-CM

## 2019-05-14 DIAGNOSIS — G92 Toxic encephalopathy: Secondary | ICD-10-CM | POA: Diagnosis present

## 2019-05-14 DIAGNOSIS — Z9861 Coronary angioplasty status: Secondary | ICD-10-CM

## 2019-05-14 DIAGNOSIS — I2510 Atherosclerotic heart disease of native coronary artery without angina pectoris: Secondary | ICD-10-CM | POA: Diagnosis present

## 2019-05-14 DIAGNOSIS — Z87891 Personal history of nicotine dependence: Secondary | ICD-10-CM | POA: Diagnosis not present

## 2019-05-14 DIAGNOSIS — I1310 Hypertensive heart and chronic kidney disease without heart failure, with stage 1 through stage 4 chronic kidney disease, or unspecified chronic kidney disease: Secondary | ICD-10-CM | POA: Diagnosis present

## 2019-05-14 DIAGNOSIS — Z7982 Long term (current) use of aspirin: Secondary | ICD-10-CM | POA: Diagnosis not present

## 2019-05-14 DIAGNOSIS — K219 Gastro-esophageal reflux disease without esophagitis: Secondary | ICD-10-CM | POA: Diagnosis present

## 2019-05-14 DIAGNOSIS — Z888 Allergy status to other drugs, medicaments and biological substances status: Secondary | ICD-10-CM | POA: Diagnosis not present

## 2019-05-14 DIAGNOSIS — Z1159 Encounter for screening for other viral diseases: Secondary | ICD-10-CM | POA: Diagnosis not present

## 2019-05-14 DIAGNOSIS — G9341 Metabolic encephalopathy: Secondary | ICD-10-CM | POA: Diagnosis present

## 2019-05-14 DIAGNOSIS — R471 Dysarthria and anarthria: Secondary | ICD-10-CM | POA: Diagnosis present

## 2019-05-14 LAB — CK: Total CK: 79 U/L (ref 49–397)

## 2019-05-14 LAB — COMPREHENSIVE METABOLIC PANEL
ALT: 12 U/L (ref 0–44)
AST: 16 U/L (ref 15–41)
Albumin: 3.4 g/dL — ABNORMAL LOW (ref 3.5–5.0)
Alkaline Phosphatase: 34 U/L — ABNORMAL LOW (ref 38–126)
Anion gap: 10 (ref 5–15)
BUN: 21 mg/dL (ref 8–23)
CO2: 24 mmol/L (ref 22–32)
Calcium: 9.1 mg/dL (ref 8.9–10.3)
Chloride: 109 mmol/L (ref 98–111)
Creatinine, Ser: 1.74 mg/dL — ABNORMAL HIGH (ref 0.61–1.24)
GFR calc Af Amer: 40 mL/min — ABNORMAL LOW (ref >60)
GFR calc non Af Amer: 34 mL/min — ABNORMAL LOW (ref >60)
Glucose, Bld: 111 mg/dL — ABNORMAL HIGH (ref 70–99)
Potassium: 3.5 mmol/L (ref 3.5–5.1)
Sodium: 143 mmol/L (ref 135–145)
Total Bilirubin: 0.7 mg/dL (ref 0.3–1.2)
Total Protein: 5.8 g/dL — ABNORMAL LOW (ref 6.5–8.1)

## 2019-05-14 LAB — LACTIC ACID, PLASMA: Lactic Acid, Venous: 1.5 mmol/L (ref 0.5–1.9)

## 2019-05-14 LAB — AMMONIA: Ammonia: 38 umol/L — ABNORMAL HIGH (ref 9–35)

## 2019-05-14 LAB — CBC
HCT: 30.7 % — ABNORMAL LOW (ref 39.0–52.0)
Hemoglobin: 10.5 g/dL — ABNORMAL LOW (ref 13.0–17.0)
MCH: 30.9 pg (ref 26.0–34.0)
MCHC: 34.2 g/dL (ref 30.0–36.0)
MCV: 90.3 fL (ref 80.0–100.0)
Platelets: 231 10*3/uL (ref 150–400)
RBC: 3.4 MIL/uL — ABNORMAL LOW (ref 4.22–5.81)
RDW: 13 % (ref 11.5–15.5)
WBC: 7.8 10*3/uL (ref 4.0–10.5)
nRBC: 0 % (ref 0.0–0.2)

## 2019-05-14 LAB — PSA: Prostatic Specific Antigen: 4.34 ng/mL — ABNORMAL HIGH (ref 0.00–4.00)

## 2019-05-14 LAB — HIV ANTIBODY (ROUTINE TESTING W REFLEX): HIV Screen 4th Generation wRfx: NONREACTIVE

## 2019-05-14 LAB — PROCALCITONIN: Procalcitonin: <0.1 ng/mL

## 2019-05-14 LAB — SARS CORONAVIRUS 2 BY RT PCR (HOSPITAL ORDER, PERFORMED IN ~~LOC~~ HOSPITAL LAB): SARS Coronavirus 2: NEGATIVE

## 2019-05-14 LAB — TROPONIN I (HIGH SENSITIVITY): Troponin I (High Sensitivity): 14 ng/L (ref <18)

## 2019-05-14 MED ORDER — SODIUM CHLORIDE 0.9 % IV SOLN
2.0000 g | Freq: Once | INTRAVENOUS | Status: AC
  Administered 2019-05-14: 2 g via INTRAVENOUS
  Filled 2019-05-14: qty 20

## 2019-05-14 MED ORDER — LORAZEPAM 2 MG/ML IJ SOLN
INTRAMUSCULAR | Status: AC
  Filled 2019-05-14: qty 1

## 2019-05-14 MED ORDER — FLUMAZENIL 0.5 MG/5ML IV SOLN
0.2000 mg | Freq: Once | INTRAVENOUS | Status: AC
  Administered 2019-05-14: 0.2 mg via INTRAVENOUS
  Filled 2019-05-14: qty 5

## 2019-05-14 MED ORDER — SODIUM CHLORIDE 0.9 % IV SOLN
500.0000 mg | INTRAVENOUS | Status: DC
  Administered 2019-05-14 – 2019-05-16 (×3): 500 mg via INTRAVENOUS
  Filled 2019-05-14 (×4): qty 500

## 2019-05-14 MED ORDER — SODIUM CHLORIDE 0.9 % IV SOLN
INTRAVENOUS | Status: DC
  Administered 2019-05-14 (×3): via INTRAVENOUS

## 2019-05-14 MED ORDER — HYDRALAZINE HCL 20 MG/ML IJ SOLN: 10.0000 mg | INTRAMUSCULAR | Status: DC | PRN

## 2019-05-14 MED ORDER — ENOXAPARIN SODIUM 40 MG/0.4ML ~~LOC~~ SOLN
40.0000 mg | SUBCUTANEOUS | Status: DC
  Administered 2019-05-14 – 2019-05-16 (×3): 40 mg via SUBCUTANEOUS
  Filled 2019-05-14 (×3): qty 0.4

## 2019-05-14 MED ORDER — SODIUM CHLORIDE 0.9 % IV SOLN
2.0000 g | INTRAVENOUS | Status: DC
  Administered 2019-05-14 – 2019-05-16 (×2): 2 g via INTRAVENOUS
  Filled 2019-05-14: qty 2
  Filled 2019-05-14: qty 20
  Filled 2019-05-14: qty 2

## 2019-05-14 MED ORDER — LORAZEPAM 2 MG/ML IJ SOLN
1.0000 mg | Freq: Once | INTRAMUSCULAR | Status: AC
  Administered 2019-05-14: 1 mg via INTRAVENOUS

## 2019-05-14 NOTE — ED Notes (Signed)
Pt restless, trying to remove wires and kerlix, assisted pt to use urinal

## 2019-05-14 NOTE — Progress Notes (Signed)
SLP Cancellation Note  Patient Details Name: [name] MRN: [id number] DOB: Nov 03, 1930   Cancelled treatment:       Reason Eval/Treat Not Completed: Patient's level of consciousness;Fatigue/lethargy limiting ability to participate; Pt restless unable to follow commands. Will follow up for readiness for bedside swallow evaluation   Minami Arriaga E Gwendalynn Eckstrom MA, CCC-SLP [facility] 05/14/2019, 7:16 AM

## 2019-05-14 NOTE — ED Notes (Signed)
ED TO INPATIENT HANDOFF REPORT  ED Nurse Name and Phone #: [phone]  S Name/Age/Gender Charles Sims 85 y.o. male Room/Bed: 023C/023C  Code Status   Code Status: Full Code  Home/SNF/Other Home Patient oriented to: self Is this baseline? No   Triage Complete: Triage complete  Chief Complaint Code Stroke  Triage Note Last seen normal at 1630.  Wife found pt in bed, confused and would not respond verbally and called EMS. EMS reportst pt would not respond or follow commands upon arrival.    Allergies Allergies  Allergen Reactions  . Lisinopril Cough    Level of Care/Admitting Diagnosis ED Disposition    ED Disposition Condition [facility]: [place] [100100]  Level of Care: Med-Surg [16]  Covid Evaluation: Confirmed COVID Negative  Diagnosis: Acute encephalopathy [244010]  Admitting Physician: Doreatha Massed  Attending Physician: Etta Quill [phone]  Estimated length of stay: past midnight tomorrow  Certification:: I certify this patient will need inpatient services for at least 2 midnights  PT Class (Do Not Modify): Inpatient [101]  PT Acc Code (Do Not Modify): Private [1]       B Medical/Surgery History Past Medical History:  Diagnosis Date  . Adenoma of right adrenal gland   . Arthritis   . Benign prostatic hypertrophy with lower urinary tract symptoms (LUTS)   . Bladder calculi   . Bradycardia   . CAD (coronary artery disease)   . Carotid stenosis   . Coronary artery disease   . Dysrhythmia    bradycardia  . Elevated PSA   . GERD (gastroesophageal reflux disease)   . Gross hematuria   . Hyperlipidemia   . Hypertension   . Prostatitis   . Shortness of breath dyspnea   . Urinary frequency    Past Surgical History:  Procedure Laterality Date  . CAROTID ENDARTERECTOMY Left   . CYSTOSCOPY WITH LITHOLAPAXY N/A 04/13/2015   Procedure: CYSTOSCOPY WITH LITHOLAPAXY;  Surgeon: Hollice Espy, MD;   Location: [facility] ORS;  Service: Urology;  Laterality: N/A;     A IV Location/Drains/Wounds Patient Lines/Drains/Airways Status   Active Line/Drains/Airways    Name:   Placement date:   Placement time:   Site:   Days:   Peripheral IV 05/13/19 Right Hand   05/13/19    2130    Hand   1   Incision (Closed) 04/13/15 Penis   04/13/15    1257     1492          Intake/Output Last 24 hours No intake or output data in the 24 hours ending 05/14/19 0128  Labs/Imaging Results for orders placed or performed during the hospital encounter of 05/13/19 (from the past 48 hour(s))  Protime-INR     Status: None   Collection Time: 05/13/19  7:33 PM  Result Value Ref Range   Prothrombin Time 15.2 11.4 - 15.2 seconds   INR 1.2 0.8 - 1.2    Comment: (NOTE) INR goal varies based on device and disease states. Performed at [facility], [place] [address] [place], [place] [address]   APTT     Status: None   Collection Time: 05/13/19  7:33 PM  Result Value Ref Range   aPTT 30 24 - 36 seconds    Comment: Performed at [facility] [address] [place], [place] [address]  CBC     Status: Abnormal   Collection Time: 05/13/19  7:33 PM  Result Value Ref Range   WBC 6.8 4.0 - 10.5 K/uL   RBC 3.66 (L) 4.22 - 5.81 MIL/uL   Hemoglobin 11.3 (L) 13.0 - 17.0 g/dL   HCT 33.9 (L) 39.0 - 52.0 %   MCV 92.6 80.0 - 100.0 fL   MCH 30.9 26.0 - 34.0 pg   MCHC 33.3 30.0 - 36.0 g/dL   RDW 12.8 11.5 - 15.5 %   Platelets 224 150 - 400 K/uL   nRBC 0.0 0.0 - 0.2 %    Comment: Performed at [facility], [place] [address], [place], [place] [address]  Differential     Status: None   Collection Time: 05/13/19  7:33 PM  Result Value Ref Range   Neutrophils Relative % 86 %   Neutro Abs 5.8 1.7 - 7.7 K/uL   Lymphocytes Relative 11 %   Lymphs Abs 0.7 0.7 - 4.0 K/uL   Monocytes Relative 2 %   Monocytes Absolute 0.2 0.1 - 1.0 K/uL   Eosinophils Relative 0 %   Eosinophils Absolute 0.0 0.0 - 0.5 K/uL    Basophils Relative 0 %   Basophils Absolute 0.0 0.0 - 0.1 K/uL   Immature Granulocytes 1 %   Abs Immature Granulocytes 0.04 0.00 - 0.07 K/uL    Comment: Performed at [facility], [place] [address], [place], [place] [address]  Comprehensive metabolic panel     Status: Abnormal   Collection Time: 05/13/19  7:33 PM  Result Value Ref Range   Sodium 141 135 - 145 mmol/L   Potassium 3.9 3.5 - 5.1 mmol/L   Chloride 107 98 - 111 mmol/L   CO2 23 22 - 32 mmol/L   Glucose, Bld 130 (H) 70 - 99 mg/dL   BUN 22 8 - 23 mg/dL   Creatinine, Ser 1.87 (H) 0.61 - 1.24 mg/dL   Calcium 9.6 8.9 - 10.3 mg/dL   Total Protein 6.3 (L) 6.5 - 8.1 g/dL   Albumin 3.7 3.5 - 5.0 g/dL   AST 16 15 - 41 U/L   ALT 14 0 - 44 U/L   Alkaline Phosphatase 37 (L) 38 - 126 U/L   Total Bilirubin 0.9 0.3 - 1.2 mg/dL   GFR calc non Af Amer 32 (L) >60 mL/min   GFR calc Af Amer 37 (L) >60 mL/min   Anion gap 11 5 - 15    Comment: Performed at [facility] [address], [place], [place] [address]  I-stat chem 8, ED     Status: Abnormal   Collection Time: 05/13/19  7:40 PM  Result Value Ref Range   Sodium 141 135 - 145 mmol/L   Potassium 3.9 3.5 - 5.1 mmol/L   Chloride 106 98 - 111 mmol/L   BUN 22 8 - 23 mg/dL   Creatinine, Ser 1.60 (H) 0.61 - 1.24 mg/dL   Glucose, Bld 122 (H) 70 - 99 mg/dL   Calcium, Ion 1.23 1.15 - 1.40 mmol/L   TCO2 25 22 - 32 mmol/L   Hemoglobin 11.2 (L) 13.0 - 17.0 g/dL   HCT 33.0 (L) 39.0 - 52.0 %  Lactic acid, plasma     Status: None   Collection Time: 05/13/19  9:18 PM  Result Value Ref Range   Lactic Acid, Venous 1.9 0.5 - 1.9 mmol/L    Comment: Performed at [facility] [address], [place], [place] [address]  TSH     Status: None   Collection Time: 05/13/19  9:18 PM  Result Value Ref Range   TSH 2.445 0.350 - 4.500 uIU/mL    Comment: Performed by a 3rd Generation assay with a functional sensitivity of <=0.01 uIU/mL. Performed at [facility], [place] [address]  [place], [place] [address]   Magnesium     Status: Abnormal   Collection Time: 05/13/19  9:18 PM  Result Value Ref Range   Magnesium 1.5 (L) 1.7 - 2.4 mg/dL    Comment: Performed at [facility] [address], [place], [place] [address]  Urinalysis, Routine w reflex microscopic     Status: Abnormal   Collection Time: 05/13/19  9:52 PM  Result Value Ref Range   Color, Urine YELLOW YELLOW   APPearance CLEAR CLEAR   Specific Gravity, Urine 1.027 1.005 - 1.030   pH 5.0 5.0 - 8.0   Glucose, UA NEGATIVE NEGATIVE mg/dL   Hgb urine dipstick MODERATE (A) NEGATIVE   Bilirubin Urine NEGATIVE NEGATIVE   Ketones, ur 5 (A) NEGATIVE mg/dL   Protein, ur 100 (A) NEGATIVE mg/dL   Nitrite NEGATIVE NEGATIVE   Leukocytes,Ua NEGATIVE NEGATIVE   RBC / HPF 0-5 0 - 5 RBC/hpf   WBC, UA 0-5 0 - 5 WBC/hpf   Bacteria, UA NONE SEEN NONE SEEN   Mucus PRESENT     Comment: Performed at [facility] [address], [place], [place] [address]  Ammonia     Status: Abnormal   Collection Time: 05/13/19 11:26 PM  Result Value Ref Range   Ammonia 38 (H) 9 - 35 umol/L    Comment: Performed at [facility], [place] [address] [place], [place] [address]  SARS Coronavirus 2 (CEPHEID- Performed in [facility] lab), Hosp Order     Status: None   Collection Time: 05/13/19 11:26 PM   Specimen: Nasopharyngeal Swab  Result Value Ref Range   SARS Coronavirus 2 NEGATIVE NEGATIVE    Comment: (NOTE) If result is NEGATIVE SARS-CoV-2 target nucleic acids are NOT DETECTED. The SARS-CoV-2 RNA is generally detectable in upper and lower  respiratory specimens during the acute phase of infection. The lowest  concentration of SARS-CoV-2 viral copies this assay can detect is 250  copies / mL. A negative result does not preclude SARS-CoV-2 infection  and should not be used as the sole basis for treatment or other  patient management decisions.  A negative result may occur with  improper specimen collection /  handling, submission of specimen other  than nasopharyngeal swab, presence of viral mutation(s) within the  areas targeted by this assay, and inadequate number of viral copies  (<250 copies / mL). A negative result must be combined with clinical  observations, patient history, and epidemiological information. If result is POSITIVE SARS-CoV-2 target nucleic acids are DETECTED. The SARS-CoV-2 RNA is generally detectable in upper and lower  respiratory specimens dur ing the acute phase of infection.  Positive  results are indicative of active infection with SARS-CoV-2.  Clinical  correlation with patient history and other diagnostic information is  necessary to determine patient infection status.  Positive results do  not rule out bacterial infection or co-infection with other viruses. If result is PRESUMPTIVE POSTIVE SARS-CoV-2 nucleic acids MAY BE PRESENT.   A presumptive positive result was obtained on the submitted specimen  and confirmed on repeat testing.  While 2019 novel coronavirus  (SARS-CoV-2) nucleic acids may be present in the submitted sample  additional confirmatory testing may be necessary for epidemiological  and / or clinical management purposes  to differentiate between  SARS-CoV-2 and other Sarbecovirus currently known to infect humans.  If clinically indicated additional testing with an alternate test  methodology [phone]) is advised. The SARS-CoV-2 RNA is generally  detectable in upper and lower respiratory sp ecimens during the acute  phase of infection. The expected result is Negative. Fact Sheet for Patients:  StrictlyIdeas.no Fact Sheet for Healthcare Providers: BankingDealers.co.za This test is not yet approved or cleared by the [place] FDA and has been authorized for detection and/or diagnosis of SARS-CoV-2 by FDA under an Emergency Use Authorization (EUA).  This EUA will remain in effect (meaning this  test can be used) for the duration of the COVID-19 declaration under Section 564(b)(1) of the Act, 21 U.S.C. section 360bbb-3(b)(1), unless the authorization is terminated or revoked sooner. Performed at [facility], [place] [address] [place], [place] [address]    Ct Code Stroke Cta Head W/wo Contrast  Result Date: 05/13/2019 CLINICAL DATA:  Initial evaluation for acute left-sided facial droop, slurred speech. EXAM: CT ANGIOGRAPHY HEAD AND NECK TECHNIQUE: Multidetector CT imaging of the head and neck was performed using the standard protocol during bolus administration of intravenous contrast. Multiplanar CT image reconstructions and MIPs were obtained to evaluate the vascular anatomy. Carotid stenosis measurements (when applicable) are obtained utilizing NASCET criteria, using the distal internal carotid diameter as the denominator. CONTRAST:  122mL OMNIPAQUE IOHEXOL 350 MG/ML SOLN COMPARISON:  Prior noncontrast head CT from earlier the same day. FINDINGS: CTA NECK FINDINGS Aortic arch: Visualized aortic arch of normal caliber with normal branch pattern. Moderate atherosclerotic change noted about the aortic arch and origin of the great vessels. Short-segment stenosis of approximately 70% noted at the proximal left subclavian artery, prior to the takeoff of the left vertebral artery (series 7, image 306). Visualized subclavian arteries otherwise widely patent. Right carotid system: Right common carotid artery tortuous proximally but widely patent to the bifurcation without stenosis. Mild atheromatous irregularity about the right bifurcation without hemodynamically significant stenosis. Right ICA widely patent distally to the skull base without stenosis, dissection, or occlusion. Left carotid system: Mild approximate 30% narrowing at the origin of the left common carotid artery. Left CCA otherwise widely patent to the bifurcation without stenosis. Postoperative changes from prior endarterectomy about  the left bifurcation. No residual hemodynamically significant stenosis at the origin of the left ICA. Approximate 50% stenosis noted at the origin of the left ECA. Left ICA widely patent distally to the skull base without stenosis, dissection, or occlusion. Vertebral arteries: Both of the vertebral arteries arise from the subclavian arteries. Focal plaque at the origin of the left vertebral artery with severe at least 80% stenosis. Left vertebral artery slightly dominant. Mild scattered atheromatous irregularity within the V2 segments bilaterally with mild to moderate stenoses. Vertebral arteries otherwise widely patent within the neck without acute abnormality. Skeleton: No acute osseous finding. No discrete lytic or blastic osseous lesions. Moderate multilevel cervical spondylolysis at C3-4 through C6-7. Degenerative changes noted about the TMJs bilaterally, right worse than left. Other neck: Approximate 2 cm cystic lesion within the subcutaneous fat of the right posterior neck most likely reflects a sebaceous cyst. Soft tissues of the neck demonstrate no acute finding. Upper chest: Unremarkable. Review of the MIP images confirms the above findings CTA HEAD FINDINGS Anterior circulation: Petrous segments widely patent. Mild scattered atheromatous plaque within the cavernous/supraclinoid ICAs without hemodynamically significant stenosis. ICA termini well perfused. A1 segments patent bilaterally. Right A1 hypoplastic,  accounting for the slightly diminutive right ICA is compared to the left. Normal anterior communicating artery. Anterior cerebral arteries patent to their distal aspects without stenosis or occlusion. No M1 stenosis or occlusion. Normal MCA bifurcations. Distal MCA branches well perfused and symmetric. Posterior circulation: Vertebral arteries patent to the vertebrobasilar junction without stenosis. Left vertebral artery dominant. Posteroinferior cerebral arteries patent bilaterally. Basilar widely  patent to its distal aspect without stenosis. Superior cerebral arteries patent bilaterally. Both of the posterior cerebral arteries primarily supplied via the basilar and are widely patent to their distal aspects. Venous sinuses: Grossly patent allowing for timing of the contrast bolus. Anatomic variants: None significant.  No intracranial aneurysm. Delayed phase: Not performed. Review of the MIP images confirms the above findings IMPRESSION: 1. Negative CTA for emergent large vessel occlusion. 2. Prior left carotid endarterectomy without residual or recurrent CCA/ICA stenosis. Persistent and/or recurrent 50% stenosis at the origin of the left ECA. 3. Short-segment severe at least 80% stenosis at the origin of the dominant left vertebral artery. Otherwise wide patency of the vertebrobasilar system. 4. Mild atherosclerotic change about the carotid siphons without hemodynamically significant or correctable stenosis. These results were communicated to Dr. Lorraine Lax at [place] 7/8/2020by text page via the [facility] messaging system. Electronically Signed   By: Jeannine Boga M.D.   On: 05/13/2019 20:38   Ct Code Stroke Cta Neck W/wo Contrast  Result Date: 05/13/2019 CLINICAL DATA:  Initial evaluation for acute left-sided facial droop, slurred speech. EXAM: CT ANGIOGRAPHY HEAD AND NECK TECHNIQUE: Multidetector CT imaging of the head and neck was performed using the standard protocol during bolus administration of intravenous contrast. Multiplanar CT image reconstructions and MIPs were obtained to evaluate the vascular anatomy. Carotid stenosis measurements (when applicable) are obtained utilizing NASCET criteria, using the distal internal carotid diameter as the denominator. CONTRAST:  187mL OMNIPAQUE IOHEXOL 350 MG/ML SOLN COMPARISON:  Prior noncontrast head CT from earlier the same day. FINDINGS: CTA NECK FINDINGS Aortic arch: Visualized aortic arch of normal caliber with normal branch pattern. Moderate  atherosclerotic change noted about the aortic arch and origin of the great vessels. Short-segment stenosis of approximately 70% noted at the proximal left subclavian artery, prior to the takeoff of the left vertebral artery (series 7, image 306). Visualized subclavian arteries otherwise widely patent. Right carotid system: Right common carotid artery tortuous proximally but widely patent to the bifurcation without stenosis. Mild atheromatous irregularity about the right bifurcation without hemodynamically significant stenosis. Right ICA widely patent distally to the skull base without stenosis, dissection, or occlusion. Left carotid system: Mild approximate 30% narrowing at the origin of the left common carotid artery. Left CCA otherwise widely patent to the bifurcation without stenosis. Postoperative changes from prior endarterectomy about the left bifurcation. No residual hemodynamically significant stenosis at the origin of the left ICA. Approximate 50% stenosis noted at the origin of the left ECA. Left ICA widely patent distally to the skull base without stenosis, dissection, or occlusion. Vertebral arteries: Both of the vertebral arteries arise from the subclavian arteries. Focal plaque at the origin of the left vertebral artery with severe at least 80% stenosis. Left vertebral artery slightly dominant. Mild scattered atheromatous irregularity within the V2 segments bilaterally with mild to moderate stenoses. Vertebral arteries otherwise widely patent within the neck without acute abnormality. Skeleton: No acute osseous finding. No discrete lytic or blastic osseous lesions. Moderate multilevel cervical spondylolysis at C3-4 through C6-7. Degenerative changes noted about the TMJs bilaterally, right worse than left. Other neck:  Approximate 2 cm cystic lesion within the subcutaneous fat of the right posterior neck most likely reflects a sebaceous cyst. Soft tissues of the neck demonstrate no acute finding. Upper  chest: Unremarkable. Review of the MIP images confirms the above findings CTA HEAD FINDINGS Anterior circulation: Petrous segments widely patent. Mild scattered atheromatous plaque within the cavernous/supraclinoid ICAs without hemodynamically significant stenosis. ICA termini well perfused. A1 segments patent bilaterally. Right A1 hypoplastic, accounting for the slightly diminutive right ICA is compared to the left. Normal anterior communicating artery. Anterior cerebral arteries patent to their distal aspects without stenosis or occlusion. No M1 stenosis or occlusion. Normal MCA bifurcations. Distal MCA branches well perfused and symmetric. Posterior circulation: Vertebral arteries patent to the vertebrobasilar junction without stenosis. Left vertebral artery dominant. Posteroinferior cerebral arteries patent bilaterally. Basilar widely patent to its distal aspect without stenosis. Superior cerebral arteries patent bilaterally. Both of the posterior cerebral arteries primarily supplied via the basilar and are widely patent to their distal aspects. Venous sinuses: Grossly patent allowing for timing of the contrast bolus. Anatomic variants: None significant.  No intracranial aneurysm. Delayed phase: Not performed. Review of the MIP images confirms the above findings IMPRESSION: 1. Negative CTA for emergent large vessel occlusion. 2. Prior left carotid endarterectomy without residual or recurrent CCA/ICA stenosis. Persistent and/or recurrent 50% stenosis at the origin of the left ECA. 3. Short-segment severe at least 80% stenosis at the origin of the dominant left vertebral artery. Otherwise wide patency of the vertebrobasilar system. 4. Mild atherosclerotic change about the carotid siphons without hemodynamically significant or correctable stenosis. These results were communicated to Dr. Lorraine Lax at [facility] 7/8/2020by text page via the [facility] messaging system. Electronically Signed   By: Jeannine Boga M.D.    On: 05/13/2019 20:38   Mr Brain Wo Contrast  Result Date: 05/13/2019 CLINICAL DATA:  Initial evaluation for acute stroke, slurred speech. EXAM: MRI HEAD WITHOUT CONTRAST TECHNIQUE: Multiplanar, multiecho pulse sequences of the brain and surrounding structures were obtained without intravenous contrast. COMPARISON:  Prior CT and CTA from earlier same day. FINDINGS: Brain: Examination technically limited as the patient was unable to tolerate the full length of the exam. Diffusion-weighted imaging, axial T2, FLAIR, and gradient echo sequences, as well as coronal T2 sequences only were performed. Additionally, images are markedly degraded by motion artifact. Generalized age-related cerebral atrophy with moderate chronic microvascular ischemic disease. No abnormal foci of restricted diffusion to suggest acute or subacute ischemia on this motion degraded exam. Gray-white matter differentiation maintained. No areas of remote cortical infarction. No evidence for acute intracranial hemorrhage. Single punctate chronic microhemorrhage noted at the anterior left frontal region, of doubtful significance in isolation. No mass lesion, midline shift or mass effect. No hydrocephalus. No extra-axial fluid collection. Vascular: Major intracranial vascular flow voids are maintained. Skull and upper cervical spine: Grossly negative on this limited exam. Sinuses/Orbits: Patient status post bilateral ocular lens replacement. Paranasal sinuses are largely clear. No mastoid effusion. Other: None. IMPRESSION: 1. Technically limited exam due to patient's inability to tolerate the full length of the study as well as motion artifact. 2. No acute intracranial infarct or other abnormality. 3. Age-related cerebral atrophy with moderate chronic microvascular ischemic disease. Electronically Signed   By: Jeannine Boga M.D.   On: 05/13/2019 21:07   Dg Chest Portable 1 View  Result Date: 05/13/2019 CLINICAL DATA:  Confused and  unresponsive. EXAM: PORTABLE CHEST 1 VIEW COMPARISON:  None. FINDINGS: Heart size is normal. Aortic atherosclerosis. Right lung is clear.  There is volume loss and infiltrate in the left lower lobe suspicious for pneumonia. No visible effusion. No acute bone finding. IMPRESSION: Left lower lobe volume loss and infiltrate suspicious for pneumonia. Electronically Signed   By: Nelson Chimes M.D.   On: 05/13/2019 21:54   Ct Head Code Stroke Wo Contrast  Result Date: 05/13/2019 CLINICAL DATA:  Code stroke. Radiologist Dr. Maeola Harman of [facility] cannot cough and back item distension stroke arm yet is yet apparent again next month by EXAM: CT HEAD WITHOUT CONTRAST TECHNIQUE: Contiguous axial images were obtained from the base of the skull through the vertex without intravenous contrast. COMPARISON:  None available. FINDINGS: Brain: Generalized age-related cerebral atrophy with moderate chronic microvascular ischemic disease. No acute intracranial hemorrhage. No acute large vessel territory infarct. No mass lesion, midline shift or mass effect. No hydrocephalus. No extra-axial fluid collection. Vascular: No hyperdense vessel. Calcified atherosclerosis at the skull base. Skull: Scalp soft tissues and calvarium within normal limits. Sinuses/Orbits: Globes and orbital soft tissues within normal limits. Mild scattered mucosal thickening within the ethmoidal air cells and maxillary sinuses. Paranasal sinuses are otherwise clear. No mastoid effusion. Other: None. ASPECTS [facility] Stroke Program Early CT Score) - Ganglionic level infarction (caudate, lentiform nuclei, internal capsule, insula, M1-M3 cortex): 7 - Supraganglionic infarction (M4-M6 cortex): 3 Total score (0-10 with 10 being normal): 10 IMPRESSION: 1. No acute intracranial infarct or other abnormality identified. 2. ASPECTS is 10. 3. Age-related cerebral atrophy with moderate chronic microvascular ischemic disease. These results were communicated to Dr. Lorraine Lax at 7:52 pmon  7/8/2020by text page via the [facility] messaging system. Electronically Signed   By: Jeannine Boga M.D.   On: 05/13/2019 19:54    Pending Labs Unresulted Labs (From admission, onward)    Start     Ordered   05/14/19 0500  CBC  Tomorrow morning,   R     05/14/19 0049   05/14/19 0500  Comprehensive metabolic panel  Tomorrow morning,   R     05/14/19 0049   05/14/19 0101  CK  Once,   STAT     05/14/19 0100   05/14/19 0056  Troponin I (High Sensitivity)  ONCE - STAT,   STAT    Question:  Indication  Answer:  Suspect ACS   05/14/19 0055   05/14/19 0045  HIV antibody (Routine Screening)  Once,   STAT     05/14/19 0049   05/14/19 0032  Procalcitonin - Baseline  ONCE - STAT,   STAT     05/14/19 0031   05/13/19 2118  Lactic acid, plasma  Now then every 2 hours,   STAT     05/13/19 2117          Vitals/Pain Today's Vitals   05/13/19 2217 05/13/19 2239 05/13/19 2329 05/14/19 0124  BP: (!) 186/82  (!) 163/86 (!) 188/80  Pulse: (!) 58  (!) 57 (!) 57  Resp: 16  (!) 24 18  Temp:      TempSrc:      SpO2:   98% 100%  Weight:  87.4 kg    Height:  5\' 8"  (1.727 m)    PainSc:        Isolation Precautions Airborne and Contact precautions  Medications Medications  sodium chloride flush (NS) 0.9 % injection 3 mL (0 mLs Intravenous Hold 05/13/19 2202)  naloxone (NARCAN) injection 0.4 mg (0.4 mg Intravenous Given 05/13/19 2142)  enoxaparin (LOVENOX) injection 40 mg (has no administration in time range)  cefTRIAXone (ROCEPHIN) 2  g in sodium chloride 0.9 % 100 mL IVPB (has no administration in time range)  azithromycin (ZITHROMAX) 500 mg in sodium chloride 0.9 % 250 mL IVPB (has no administration in time range)  cefTRIAXone (ROCEPHIN) 2 g in sodium chloride 0.9 % 100 mL IVPB (has no administration in time range)  0.9 %  sodium chloride infusion (has no administration in time range)  iohexol (OMNIPAQUE) 350 MG/ML injection 100 mL (100 mLs Intravenous Contrast Given 05/13/19 2001)  magnesium  sulfate IVPB 2 g 50 mL (0 g Intravenous Stopped 05/14/19 0124)  LORazepam (ATIVAN) injection 1 mg (1 mg Intravenous Given 05/14/19 0056)    Mobility walks High fall risk   Focused Assessments Neuro Assessment Handoff:  Swallow screen pass? No  Cardiac Rhythm: Normal sinus rhythm NIH Stroke Scale ( + Modified Stroke Scale Criteria)  Interval: Initial Level of Consciousness (1a.)   : Not alert, but arousable by minor stimulation to obey, answer, or respond LOC Questions (1b. )   +: Answers neither question correctly LOC Commands (1c. )   + : Performs both tasks correctly Best Gaze (2. )  +: Normal Visual (3. )  +: No visual loss Facial Palsy (4. )    : Normal symmetrical movements Motor Arm, Left (5a. )   +: Drift Motor Arm, Right (5b. )   +: Drift Motor Leg, Left (6a. )   +: Drift Motor Leg, Right (6b. )   +: Drift Limb Ataxia (7. ): Absent Sensory (8. )   +: Normal, no sensory loss Best Language (9. )   +: No aphasia Dysarthria (10. ): Mild-to-moderate dysarthria, patient slurs at least some words and, at worst, can be understood with some difficulty Extinction/Inattention (11.)   +: No Abnormality Modified SS Total  +: 6 Complete NIHSS TOTAL: 8 Last date known well: 05/13/19 Last time known well: 1630 Neuro Assessment: Exceptions to [facility] Neuro Checks:   Initial (05/13/19 1935)  Last Documented NIHSS Modified Score: 6 (05/13/19 2000) Has TPA been given? No If patient is a Neuro Trauma and patient is going to OR before floor call report to [facility] nurse: [phone] or [phone]     R Recommendations: See Admitting Provider Note  Report given to:   Additional Notes:

## 2019-05-14 NOTE — Progress Notes (Signed)
Subjective: Mr. Clausing was laying in bed, initially not responding at all. Was following commands. He did ask where he was.   On evaluation later in the day patient was more alert and responsive, he was following all commands and oriented to time.   Exam: Vitals:   05/14/19 0308 05/14/19 0746  BP: (!) 171/57 (!) 183/73  Pulse: (!) 47 64  Resp: 19 18  Temp: 98.9 F (37.2 C) 98.7 F (37.1 C)  SpO2: 100% 96%   Gen: In bed, NAD Resp: non-labored breathing, no acute distress Abd: soft, nt  Neuro: MS: Awake, oriented to time, following commands CN: II: Pupils pinpoint, round, reactive to light and accommodation III,IV, VI: ptosis not present, EOMI V,VII: No obvious facial asymmetry VIII: Responds to voice,  IX,X: uvula rises symmetrically XI: bilateral shoulder shrug XII: midline tongue extension, preference towards left side  Motor: Moves all extremities, only occasionally following commands. At least 4/5 in all extremities.  Tone and bulk:normal tone throughout; no atrophy noted Sensory: Light touch intact Deep Tendon Reflexes:  1+ throughout, equal  Plantars: Right: downgoing   Left: downgoing Cerebellar: FTN intact with bilateral tremor Gait: unable to assess   Pertinent Labs:  CMP showed Cr 1.74, appears to be close to baseline CBC showed no leukocytosis, chronic normocytic anemia with Hgb 10.5 CK 79 Ammonia 38 U/A showed no LE, WBC, nitrites TSH wnl  CT abdomen pelvis: Colonic diverticulosis, no evidence of diverticulitis, enlarged prostate gland, 8 mm lymph node anterior to prostate gland MRI brain: Limited exam, no acute intracranial infarct, age-related cerebral atrophy with moderate chronic microvascular ischemic disease  Impression: This is a 85 year old male with a history of CAD, HTN, HLD who presented as stroke alert with AMS. CT head negative, CTA showed no evidence of LVO. MRI showed no acute findings, cerebral atrophy with moderate chronic  microvascular ischemic disease. Patient has had no leukocytosis and has been afebrile.  However chest x-ray is concerning for possible left lower lobe pneumonia, given his age he may have a poor roommate immune response and this could be contributing.  Lactic acid was normal. CK is within normal limits.   Given the pinpoint pupils his symptoms could be medication related, home medications do include tramadol however patient received Narcan in the ED with little response so this is less likely.  Today patient had improved response, he is able to follow commands but is not answering questions.  Did ask where he was but this was only interaction.  Patient has no history of seizure disorder however will evaluate to rule this out.  Encephalopathy, possibly metabolic due to medication induced vs acute infection, concern for left lower lobe pneumonia.  Other possibilities include seizure activity.  Recommendations: 1) F/u routine EEG 2) Continue treatment for possible pneumonia per primary 3) Continue to follow  [name], M.D. PGY2 05/14/2019 8:25 AM

## 2019-05-14 NOTE — ED Notes (Signed)
admit Provider at bedside. 

## 2019-05-14 NOTE — Progress Notes (Signed)
PROGRESS NOTE    Charles Sims  DQQ:[id number] DOB: [birth year] DOA: 05/13/2019 PCP: [name], MD   Brief Narrative: Charles Sims is a 85 y.o. male    Assessment & Plan:   Principal Problem:   Acute encephalopathy Active Problems:   CAD S/P percutaneous coronary angioplasty   Community acquired pneumonia of left lower lobe of lung (HCC)   Abdominal tenderness, left lower quadrant   Confusion Unknown etiology.  MRI was negative for CVA.  CTA head and neck negative.  Possible left lower lobe pneumonia.  CT abdomen pelvis was negative as well for acute process.  Confusion is associated with pinpoint pupils.  He received Narcan in the ED with no improvement or resolution of symptoms.  EEG is pending. Ammonia slightly elevated. Per wife, symptoms started after starting steroid treatment for back pain. -Continue to treat for infection and monitor -Obtain RPR -Neurology recommendations -Obtain blood cultures -Give a dose of flumazenil  Left lower lobe pneumonia Patient is afebrile with no leukocytosis but significant confusion.  Possible this could be contributing.  Started on empiric antibiotics including ceftriaxone and azithromycin.  LLQ tenderness Does not appear to be causing issues at this time. CT significant for diverticulosis with no diverticulitis.  Hypertension -Continue hydralazine prn  Enlarged prostate Seen on CT scan. Family history of kidney cancer in father, per wife. -Outpatient follow-up   DVT prophylaxis: Lovenox Code Status:   Code Status: Full Code Family Communication: Wife on telephone Disposition Plan: Discharge pending continued medical management   Consultants:   Neurology  Procedures:   EEG pending  Antimicrobials:  Ceftriaxone (7/8>>  Azithromycin (7/8>>    Subjective: States he is dying. Has chills.  Objective: Vitals:   05/14/19 0124 05/14/19 0146 05/14/19 0308 05/14/19 0746  BP: (!) 188/80  (!) 171/57 (!) 183/73    Pulse: (!) 57 (!) 51 (!) 47 64  Resp: 18 15 19 18   Temp:  99 F (37.2 C) 98.9 F (37.2 C) 98.7 F (37.1 C)  TempSrc:  Axillary Axillary Axillary  SpO2: 100% 98% 100% 96%  Weight:      Height:        Intake/Output Summary (Last 24 hours) at 05/14/2019 1024 Last data filed at 05/14/2019 0336 Gross per 24 hour  Intake 130.87 ml  Output --  Net 130.87 ml   Filed Weights   05/13/19 1900 05/13/19 2239  Weight: 87.4 kg 87.4 kg    Examination:  General exam: Agitated Eyes: bilateral miosis Respiratory system: Clear to auscultation, diminished. Respiratory effort normal. Cardiovascular system: S1 & S2 heard, RRR. No murmurs, rubs, gallops or clicks. Gastrointestinal system: Abdomen is nondistended, soft and nontender. No organomegaly or masses felt. Normal bowel sounds heard. Central nervous system: Alert and oriented to person. Extremities: No edema. No calf tenderness Skin: No cyanosis. No rashes Psychiatry: Judgement and insight appear impaired. Agitated    Data Reviewed: I have personally reviewed following labs and imaging studies  CBC: Recent Labs  Lab 05/13/19 1933 05/13/19 1940 05/14/19 0720  WBC 6.8  --  7.8  NEUTROABS 5.8  --   --   HGB 11.3* 11.2* 10.5*  HCT 33.9* 33.0* 30.7*  MCV 92.6  --  90.3  PLT 224  --  194   Basic Metabolic Panel: Recent Labs  Lab 05/13/19 1933 05/13/19 1940 05/13/19 2118 05/14/19 0720  NA 141 141  --  143  K 3.9 3.9  --  3.5  CL 107 106  --  109  CO2 23  --   --  24  GLUCOSE 130* 122*  --  111*  BUN 22 22  --  21  CREATININE 1.87* 1.60*  --  1.74*  CALCIUM 9.6  --   --  9.1  MG  --   --  1.5*  --    GFR: Estimated Creatinine Clearance: 32.2 mL/min (A) (by C-G formula based on SCr of 1.74 mg/dL (H)). Liver Function Tests: Recent Labs  Lab 05/13/19 1933 05/14/19 0720  AST 16 16  ALT 14 12  ALKPHOS 37* 34*  BILITOT 0.9 0.7  PROT 6.3* 5.8*  ALBUMIN 3.7 3.4*   No results for input(s): LIPASE, AMYLASE in the last  168 hours. Recent Labs  Lab 05/13/19 2326  AMMONIA 38*   Coagulation Profile: Recent Labs  Lab 05/13/19 1933  INR 1.2   Cardiac Enzymes: Recent Labs  Lab 05/14/19 0203  CKTOTAL 79   BNP (last 3 results) No results for input(s): PROBNP in the last 8760 hours. HbA1C: No results for input(s): HGBA1C in the last 72 hours. CBG: No results for input(s): GLUCAP in the last 168 hours. Lipid Profile: No results for input(s): CHOL, HDL, LDLCALC, TRIG, CHOLHDL, LDLDIRECT in the last 72 hours. Thyroid Function Tests: Recent Labs    05/13/19 2118  TSH 2.445   Anemia Panel: No results for input(s): VITAMINB12, FOLATE, FERRITIN, TIBC, IRON, RETICCTPCT in the last 72 hours. Sepsis Labs: Recent Labs  Lab 05/13/19 2118 05/14/19 0203  PROCALCITON  --  <0.10  LATICACIDVEN 1.9 1.5    Recent Results (from the past 240 hour(s))  SARS Coronavirus 2 (CEPHEID- Performed in [facility] hospital lab), Hosp Order     Status: None   Collection Time: 05/13/19 11:26 PM   Specimen: Nasopharyngeal Swab  Result Value Ref Range Status   SARS Coronavirus 2 NEGATIVE NEGATIVE Final    Comment: (NOTE) If result is NEGATIVE SARS-CoV-2 target nucleic acids are NOT DETECTED. The SARS-CoV-2 RNA is generally detectable in upper and lower  respiratory specimens during the acute phase of infection. The lowest  concentration of SARS-CoV-2 viral copies this assay can detect is 250  copies / mL. A negative result does not preclude SARS-CoV-2 infection  and should not be used as the sole basis for treatment or other  patient management decisions.  A negative result may occur with  improper specimen collection / handling, submission of specimen other  than nasopharyngeal swab, presence of viral mutation(s) within the  areas targeted by this assay, and inadequate number of viral copies  (<250 copies / mL). A negative result must be combined with clinical  observations, patient history, and epidemiological  information. If result is POSITIVE SARS-CoV-2 target nucleic acids are DETECTED. The SARS-CoV-2 RNA is generally detectable in upper and lower  respiratory specimens dur ing the acute phase of infection.  Positive  results are indicative of active infection with SARS-CoV-2.  Clinical  correlation with patient history and other diagnostic information is  necessary to determine patient infection status.  Positive results do  not rule out bacterial infection or co-infection with other viruses. If result is PRESUMPTIVE POSTIVE SARS-CoV-2 nucleic acids MAY BE PRESENT.   A presumptive positive result was obtained on the submitted specimen  and confirmed on repeat testing.  While 2019 novel coronavirus  (SARS-CoV-2) nucleic acids may be present in the submitted sample  additional confirmatory testing may be necessary for epidemiological  and / or clinical management purposes  to differentiate between  SARS-CoV-2 and other Sarbecovirus currently known to infect humans.  If clinically indicated additional testing with an alternate test  methodology [phone]) is advised. The SARS-CoV-2 RNA is generally  detectable in upper and lower respiratory sp ecimens during the acute  phase of infection. The expected result is Negative. Fact Sheet for Patients:  StrictlyIdeas.no Fact Sheet for Healthcare Providers: BankingDealers.co.za This test is not yet approved or cleared by the [place] FDA and has been authorized for detection and/or diagnosis of SARS-CoV-2 by FDA under an Emergency Use Authorization (EUA).  This EUA will remain in effect (meaning this test can be used) for the duration of the COVID-19 declaration under Section 564(b)(1) of the Act, 21 U.S.C. section 360bbb-3(b)(1), unless the authorization is terminated or revoked sooner. Performed at [facility], [place] [address], [place], [place] [address]          Radiology  Studies: Ct Abdomen Pelvis Wo Contrast  Result Date: 05/14/2019 CLINICAL DATA:  Left lower quadrant abdominal tenderness. EXAM: CT ABDOMEN AND PELVIS WITHOUT CONTRAST TECHNIQUE: Multidetector CT imaging of the abdomen and pelvis was performed following the standard protocol without IV contrast. COMPARISON:  None. FINDINGS: Lower chest: No confluent airspace disease or pleural fluid. Cardiomegaly. Hepatobiliary: No focal liver abnormality is seen. No gallstones, gallbladder wall thickening, or biliary dilatation. Pancreas: Parenchymal atrophy. No ductal dilatation or inflammation. Spleen: Normal in size without focal abnormality. Adrenals/Urinary Tract: 2.2 cm low-density right upper quadrant lesion abutting the medial limb of the right adrenal gland likely an exophytic adenoma, unchanged from prior exam. Normal left adrenal gland. Excreted IV contrast within both renal collecting systems from prior head CTA. Thinning of bilateral renal parenchyma. No hydronephrosis. Small cyst in the left kidney. Excreted IV contrast distends the urinary bladder. Trabeculated bladder. Stomach/Bowel: Small hiatal hernia. Stomach otherwise unremarkable. No small bowel inflammation, wall thickening, or obstruction. Normal appendix courses in the right upper quadrant. Multifocal colonic diverticulosis. No acute diverticulitis. Stool distends the rectum. Vascular/Lymphatic: Moderate to advanced aorta bi-iliac atherosclerosis. No aneurysm. There is an 8 mm lymph node anterior to the prostate gland, increased in size from prior. Reproductive: Enlarged prostate gland measuring 6.2 x 5.9 x 6.3 cm (volume = 120 cm^3)with mass effect on the bladder base. Other: No free air, free fluid, or intra-abdominal fluid collection. Musculoskeletal: There are no acute or suspicious osseous abnormalities. IMPRESSION: 1. Colonic diverticulosis without evidence of acute diverticulitis. 2. Enlarged prostate gland, with mass effect on the bladder base.  Recommend correlation with PSA. 3. An 8 mm lymph node anterior to the prostate gland, increased in size from prior exam, nonspecific. 4.  Aortic Atherosclerosis (ICD10-I70.0). Electronically Signed   By: Keith Rake M.D.   On: 05/14/2019 01:39   Ct Code Stroke Cta Head W/wo Contrast  Result Date: 05/13/2019 CLINICAL DATA:  Initial evaluation for acute left-sided facial droop, slurred speech. EXAM: CT ANGIOGRAPHY HEAD AND NECK TECHNIQUE: Multidetector CT imaging of the head and neck was performed using the standard protocol during bolus administration of intravenous contrast. Multiplanar CT image reconstructions and MIPs were obtained to evaluate the vascular anatomy. Carotid stenosis measurements (when applicable) are obtained utilizing NASCET criteria, using the distal internal carotid diameter as the denominator. CONTRAST:  189mL OMNIPAQUE IOHEXOL 350 MG/ML SOLN COMPARISON:  Prior noncontrast head CT from earlier the same day. FINDINGS: CTA NECK FINDINGS Aortic arch: Visualized aortic arch of normal caliber with normal branch pattern. Moderate atherosclerotic change noted about the aortic arch and origin of the great vessels.  Short-segment stenosis of approximately 70% noted at the proximal left subclavian artery, prior to the takeoff of the left vertebral artery (series 7, image 306). Visualized subclavian arteries otherwise widely patent. Right carotid system: Right common carotid artery tortuous proximally but widely patent to the bifurcation without stenosis. Mild atheromatous irregularity about the right bifurcation without hemodynamically significant stenosis. Right ICA widely patent distally to the skull base without stenosis, dissection, or occlusion. Left carotid system: Mild approximate 30% narrowing at the origin of the left common carotid artery. Left CCA otherwise widely patent to the bifurcation without stenosis. Postoperative changes from prior endarterectomy about the left bifurcation. No  residual hemodynamically significant stenosis at the origin of the left ICA. Approximate 50% stenosis noted at the origin of the left ECA. Left ICA widely patent distally to the skull base without stenosis, dissection, or occlusion. Vertebral arteries: Both of the vertebral arteries arise from the subclavian arteries. Focal plaque at the origin of the left vertebral artery with severe at least 80% stenosis. Left vertebral artery slightly dominant. Mild scattered atheromatous irregularity within the V2 segments bilaterally with mild to moderate stenoses. Vertebral arteries otherwise widely patent within the neck without acute abnormality. Skeleton: No acute osseous finding. No discrete lytic or blastic osseous lesions. Moderate multilevel cervical spondylolysis at C3-4 through C6-7. Degenerative changes noted about the TMJs bilaterally, right worse than left. Other neck: Approximate 2 cm cystic lesion within the subcutaneous fat of the right posterior neck most likely reflects a sebaceous cyst. Soft tissues of the neck demonstrate no acute finding. Upper chest: Unremarkable. Review of the MIP images confirms the above findings CTA HEAD FINDINGS Anterior circulation: Petrous segments widely patent. Mild scattered atheromatous plaque within the cavernous/supraclinoid ICAs without hemodynamically significant stenosis. ICA termini well perfused. A1 segments patent bilaterally. Right A1 hypoplastic, accounting for the slightly diminutive right ICA is compared to the left. Normal anterior communicating artery. Anterior cerebral arteries patent to their distal aspects without stenosis or occlusion. No M1 stenosis or occlusion. Normal MCA bifurcations. Distal MCA branches well perfused and symmetric. Posterior circulation: Vertebral arteries patent to the vertebrobasilar junction without stenosis. Left vertebral artery dominant. Posteroinferior cerebral arteries patent bilaterally. Basilar widely patent to its distal aspect  without stenosis. Superior cerebral arteries patent bilaterally. Both of the posterior cerebral arteries primarily supplied via the basilar and are widely patent to their distal aspects. Venous sinuses: Grossly patent allowing for timing of the contrast bolus. Anatomic variants: None significant.  No intracranial aneurysm. Delayed phase: Not performed. Review of the MIP images confirms the above findings IMPRESSION: 1. Negative CTA for emergent large vessel occlusion. 2. Prior left carotid endarterectomy without residual or recurrent CCA/ICA stenosis. Persistent and/or recurrent 50% stenosis at the origin of the left ECA. 3. Short-segment severe at least 80% stenosis at the origin of the dominant left vertebral artery. Otherwise wide patency of the vertebrobasilar system. 4. Mild atherosclerotic change about the carotid siphons without hemodynamically significant or correctable stenosis. These results were communicated to Dr. Lorraine Lax at [facility] 7/8/2020by text page via the [facility] messaging system. Electronically Signed   By: Jeannine Boga M.D.   On: 05/13/2019 20:38   Ct Code Stroke Cta Neck W/wo Contrast  Result Date: 05/13/2019 CLINICAL DATA:  Initial evaluation for acute left-sided facial droop, slurred speech. EXAM: CT ANGIOGRAPHY HEAD AND NECK TECHNIQUE: Multidetector CT imaging of the head and neck was performed using the standard protocol during bolus administration of intravenous contrast. Multiplanar CT image reconstructions and MIPs were obtained  to evaluate the vascular anatomy. Carotid stenosis measurements (when applicable) are obtained utilizing NASCET criteria, using the distal internal carotid diameter as the denominator. CONTRAST:  118mL OMNIPAQUE IOHEXOL 350 MG/ML SOLN COMPARISON:  Prior noncontrast head CT from earlier the same day. FINDINGS: CTA NECK FINDINGS Aortic arch: Visualized aortic arch of normal caliber with normal branch pattern. Moderate atherosclerotic change noted about the  aortic arch and origin of the great vessels. Short-segment stenosis of approximately 70% noted at the proximal left subclavian artery, prior to the takeoff of the left vertebral artery (series 7, image 306). Visualized subclavian arteries otherwise widely patent. Right carotid system: Right common carotid artery tortuous proximally but widely patent to the bifurcation without stenosis. Mild atheromatous irregularity about the right bifurcation without hemodynamically significant stenosis. Right ICA widely patent distally to the skull base without stenosis, dissection, or occlusion. Left carotid system: Mild approximate 30% narrowing at the origin of the left common carotid artery. Left CCA otherwise widely patent to the bifurcation without stenosis. Postoperative changes from prior endarterectomy about the left bifurcation. No residual hemodynamically significant stenosis at the origin of the left ICA. Approximate 50% stenosis noted at the origin of the left ECA. Left ICA widely patent distally to the skull base without stenosis, dissection, or occlusion. Vertebral arteries: Both of the vertebral arteries arise from the subclavian arteries. Focal plaque at the origin of the left vertebral artery with severe at least 80% stenosis. Left vertebral artery slightly dominant. Mild scattered atheromatous irregularity within the V2 segments bilaterally with mild to moderate stenoses. Vertebral arteries otherwise widely patent within the neck without acute abnormality. Skeleton: No acute osseous finding. No discrete lytic or blastic osseous lesions. Moderate multilevel cervical spondylolysis at C3-4 through C6-7. Degenerative changes noted about the TMJs bilaterally, right worse than left. Other neck: Approximate 2 cm cystic lesion within the subcutaneous fat of the right posterior neck most likely reflects a sebaceous cyst. Soft tissues of the neck demonstrate no acute finding. Upper chest: Unremarkable. Review of the MIP  images confirms the above findings CTA HEAD FINDINGS Anterior circulation: Petrous segments widely patent. Mild scattered atheromatous plaque within the cavernous/supraclinoid ICAs without hemodynamically significant stenosis. ICA termini well perfused. A1 segments patent bilaterally. Right A1 hypoplastic, accounting for the slightly diminutive right ICA is compared to the left. Normal anterior communicating artery. Anterior cerebral arteries patent to their distal aspects without stenosis or occlusion. No M1 stenosis or occlusion. Normal MCA bifurcations. Distal MCA branches well perfused and symmetric. Posterior circulation: Vertebral arteries patent to the vertebrobasilar junction without stenosis. Left vertebral artery dominant. Posteroinferior cerebral arteries patent bilaterally. Basilar widely patent to its distal aspect without stenosis. Superior cerebral arteries patent bilaterally. Both of the posterior cerebral arteries primarily supplied via the basilar and are widely patent to their distal aspects. Venous sinuses: Grossly patent allowing for timing of the contrast bolus. Anatomic variants: None significant.  No intracranial aneurysm. Delayed phase: Not performed. Review of the MIP images confirms the above findings IMPRESSION: 1. Negative CTA for emergent large vessel occlusion. 2. Prior left carotid endarterectomy without residual or recurrent CCA/ICA stenosis. Persistent and/or recurrent 50% stenosis at the origin of the left ECA. 3. Short-segment severe at least 80% stenosis at the origin of the dominant left vertebral artery. Otherwise wide patency of the vertebrobasilar system. 4. Mild atherosclerotic change about the carotid siphons without hemodynamically significant or correctable stenosis. These results were communicated to Dr. Lorraine Lax at [place] 7/8/2020by text page via the [facility] messaging system. Electronically  Signed   By: Jeannine Boga M.D.   On: 05/13/2019 20:38   Mr Brain Wo  Contrast  Result Date: 05/13/2019 CLINICAL DATA:  Initial evaluation for acute stroke, slurred speech. EXAM: MRI HEAD WITHOUT CONTRAST TECHNIQUE: Multiplanar, multiecho pulse sequences of the brain and surrounding structures were obtained without intravenous contrast. COMPARISON:  Prior CT and CTA from earlier same day. FINDINGS: Brain: Examination technically limited as the patient was unable to tolerate the full length of the exam. Diffusion-weighted imaging, axial T2, FLAIR, and gradient echo sequences, as well as coronal T2 sequences only were performed. Additionally, images are markedly degraded by motion artifact. Generalized age-related cerebral atrophy with moderate chronic microvascular ischemic disease. No abnormal foci of restricted diffusion to suggest acute or subacute ischemia on this motion degraded exam. Gray-white matter differentiation maintained. No areas of remote cortical infarction. No evidence for acute intracranial hemorrhage. Single punctate chronic microhemorrhage noted at the anterior left frontal region, of doubtful significance in isolation. No mass lesion, midline shift or mass effect. No hydrocephalus. No extra-axial fluid collection. Vascular: Major intracranial vascular flow voids are maintained. Skull and upper cervical spine: Grossly negative on this limited exam. Sinuses/Orbits: Patient status post bilateral ocular lens replacement. Paranasal sinuses are largely clear. No mastoid effusion. Other: None. IMPRESSION: 1. Technically limited exam due to patient's inability to tolerate the full length of the study as well as motion artifact. 2. No acute intracranial infarct or other abnormality. 3. Age-related cerebral atrophy with moderate chronic microvascular ischemic disease. Electronically Signed   By: Jeannine Boga M.D.   On: 05/13/2019 21:07   Dg Chest Portable 1 View  Result Date: 05/13/2019 CLINICAL DATA:  Confused and unresponsive. EXAM: PORTABLE CHEST 1 VIEW  COMPARISON:  None. FINDINGS: Heart size is normal. Aortic atherosclerosis. Right lung is clear. There is volume loss and infiltrate in the left lower lobe suspicious for pneumonia. No visible effusion. No acute bone finding. IMPRESSION: Left lower lobe volume loss and infiltrate suspicious for pneumonia. Electronically Signed   By: Nelson Chimes M.D.   On: 05/13/2019 21:54   Ct Head Code Stroke Wo Contrast  Result Date: 05/13/2019 CLINICAL DATA:  Code stroke. Radiologist Dr. Maeola Harman of [facility] cannot cough and back item distension stroke arm yet is yet apparent again next month by EXAM: CT HEAD WITHOUT CONTRAST TECHNIQUE: Contiguous axial images were obtained from the base of the skull through the vertex without intravenous contrast. COMPARISON:  None available. FINDINGS: Brain: Generalized age-related cerebral atrophy with moderate chronic microvascular ischemic disease. No acute intracranial hemorrhage. No acute large vessel territory infarct. No mass lesion, midline shift or mass effect. No hydrocephalus. No extra-axial fluid collection. Vascular: No hyperdense vessel. Calcified atherosclerosis at the skull base. Skull: Scalp soft tissues and calvarium within normal limits. Sinuses/Orbits: Globes and orbital soft tissues within normal limits. Mild scattered mucosal thickening within the ethmoidal air cells and maxillary sinuses. Paranasal sinuses are otherwise clear. No mastoid effusion. Other: None. ASPECTS [facility] Stroke Program Early CT Score) - Ganglionic level infarction (caudate, lentiform nuclei, internal capsule, insula, M1-M3 cortex): 7 - Supraganglionic infarction (M4-M6 cortex): 3 Total score (0-10 with 10 being normal): 10 IMPRESSION: 1. No acute intracranial infarct or other abnormality identified. 2. ASPECTS is 10. 3. Age-related cerebral atrophy with moderate chronic microvascular ischemic disease. These results were communicated to Dr. Lorraine Lax at 7:52 [facility] 7/8/2020by text page via the [facility]  messaging system. Electronically Signed   By: Jeannine Boga M.D.   On: 05/13/2019 19:54  Scheduled Meds:  enoxaparin (LOVENOX) injection  40 mg Subcutaneous Q24H   sodium chloride flush  3 mL Intravenous Once   Continuous Infusions:  sodium chloride 100 mL/hr at 05/14/19 0338   azithromycin 500 mg (05/14/19 0339)   [START ON 05/15/2019] cefTRIAXone (ROCEPHIN)  IV       LOS: 0 days     Cordelia Poche, MD [facility] 05/14/2019, 10:24 AM  If 7PM-7AM, please contact night-coverage [email or web address]

## 2019-05-14 NOTE — Procedures (Signed)
History: 85 year old male being evaluated for confusion  Sedation: None  Technique: This is a 21 channel routine scalp EEG performed at the bedside with bipolar and monopolar montages arranged in accordance to the international 10/20 system of electrode placement. One channel was dedicated to EKG recording.    Background: The background consists of predominantly of intermixed alpha and beta activities. There is a well defined posterior dominant rhythm of 8-9 hz that attenuates with eye opening. Sleep is recorded with normal appearing structures.  Even during the maximal waking state, there is mild intrusion of irregular slow activity into the background.  Photic stimulation: Physiologic driving is not performed  EEG Abnormalities: 1) mild irregular generalized slow activity  Clinical Interpretation: This EEG is consistent with a mild nonspecific generalized cerebral dysfunction (encephalopathy). There was no seizure or seizure predisposition recorded on this study. Please note that lack of epileptiform activity on EEG does not preclude the possibility of epilepsy.   Roland Rack, MD [facility] [phone]  If 7pm- 7am, please page neurology on call as listed in [place].

## 2019-05-14 NOTE — ED Notes (Signed)
Attempted report to RN, rn unavailable for report, will call back

## 2019-05-14 NOTE — H&P (Addendum)
History and Physical    GUTHRIE LEMME SVX:[id number] DOB: [birth year] DOA: 05/13/2019  PCP: [name], MD  Patient coming from: Home  I have personally briefly reviewed patient's old medical records in [place]  Chief Complaint: AMS  HPI: Charles Sims is a 85 y.o. male with medical history significant of CAD, HTN, carotid stenosis s/p CEA.  Patient found by wife laying in bed at around 64 with AMS, dysarthria, and reported facial droop to the left.  Patient MAE but not following commands nor answering questions appropriately.  This is a significant change for patient.  At baseline (earlier today) patient is apparently walking, talking, and doing most or all ADLs independently still according to Son.   ED Course: Initially seen as a code stroke, MRI brain neg, CTA head and neck neg for acute finding.  UA with moderate HGB, no reds nor WBC, no bacteria, no LE.  CMP mostly just remarkable for Creat 1.87, but looks like this might be chronic (1.5 back in 2018).  Mg 1.5 (replaced).  CXR suspicious for LLL PNA.  Patient continues to be altered, have dysarthria, and not be following commands.   Review of Systems: Unable to perform due to AMS.  Past Medical History:  Diagnosis Date   Adenoma of right adrenal gland    Arthritis    Benign prostatic hypertrophy with lower urinary tract symptoms (LUTS)    Bladder calculi    Bradycardia    CAD (coronary artery disease)    Carotid stenosis    Coronary artery disease    Dysrhythmia    bradycardia   Elevated PSA    GERD (gastroesophageal reflux disease)    Gross hematuria    Hyperlipidemia    Hypertension    Prostatitis    Shortness of breath dyspnea    Urinary frequency     Past Surgical History:  Procedure Laterality Date   CAROTID ENDARTERECTOMY Left    CYSTOSCOPY WITH LITHOLAPAXY N/A 04/13/2015   Procedure: CYSTOSCOPY WITH LITHOLAPAXY;  Surgeon: Hollice Espy, MD;  Location: [facility] ORS;   Service: Urology;  Laterality: N/A;     reports that he quit smoking about 14 years ago. He has never used smokeless tobacco. He reports that he does not drink alcohol or use drugs.  Allergies  Allergen Reactions   Lisinopril Cough    Family History  Problem Relation Age of Onset   Kidney cancer Father    Kidney cancer Paternal Grandfather    Alzheimer's disease Mother      Prior to Admission medications   Medication Sig Start Date End Date Taking? Authorizing Provider  aspirin EC 81 MG tablet Take 81 mg by mouth daily.    [provider]  bismuth subsalicylate (PEPTO BISMOL) 262 MG/15ML suspension Take 45 mLs by mouth once. 04/13/15   [provider]  doxazosin (CARDURA) 4 MG tablet Take 4 mg by mouth daily.    [provider]  loperamide (IMODIUM A-D) 2 MG tablet Take 2 mg by mouth daily.    [provider]  losartan-hydrochlorothiazide (HYZAAR) 50-12.5 MG per tablet Take 1 tablet by mouth daily.    [provider]  meloxicam (MOBIC) 15 MG tablet  05/17/15   [provider]  omeprazole (PRILOSEC) 20 MG capsule Take 20 mg by mouth daily.    [provider]  simvastatin (ZOCOR) 20 MG tablet Take 20 mg by mouth daily.    [provider]  traMADol (ULTRAM) 50 MG tablet  05/18/15   [provider]    Physical Exam: Vitals:   05/13/19 2057 05/13/19 2217 05/13/19 2239 05/13/19 2329  BP: (!) 181/75 (!) 186/82  (!) 163/86  Pulse: 60 (!) 58  (!) 57  Resp: 16 16  (!) 24  Temp: 97.6 F (36.4 C)     TempSrc: Rectal     SpO2: 95%   98%  Weight:   87.4 kg   Height:   5\' 8"  (1.727 m)     Constitutional: Altered, intermittently agitated Eyes: PERRL, lids and conjunctivae normal ENMT: Mucous membranes are moist. Posterior pharynx clear of any exudate or lesions.Normal dentition.  Neck: normal, supple, no masses, no thyromegaly Respiratory: clear to auscultation bilaterally, no wheezing, no crackles.  Normal respiratory effort. No accessory muscle use.  Cardiovascular: Regular rate and rhythm, no murmurs / rubs / gallops. No extremity edema. 2+ pedal pulses. No carotid bruits.  Abdomen: Guarding and apparent TTP in LLQ (though he denies pain anywhere when asked). Musculoskeletal: no clubbing / cyanosis. No joint deformity upper and lower extremities. Good ROM, no contractures. Normal muscle tone.  Skin: no rashes, lesions, ulcers. No induration Neurologic: MAE, not following commands, no obvious facial droop.  GCS:  Psychiatric: Confused, intermittently agitated.   Labs on Admission: I have personally reviewed following labs and imaging studies  CBC: Recent Labs  Lab 05/13/19 1933 05/13/19 1940  WBC 6.8  --   NEUTROABS 5.8  --   HGB 11.3* 11.2*  HCT 33.9* 33.0*  MCV 92.6  --   PLT 224  --    Basic Metabolic Panel: Recent Labs  Lab 05/13/19 1933 05/13/19 1940 05/13/19 2118  NA 141 141  --   K 3.9 3.9  --   CL 107 106  --   CO2 23  --   --   GLUCOSE 130* 122*  --   BUN 22 22  --   CREATININE 1.87* 1.60*  --   CALCIUM 9.6  --   --   MG  --   --  1.5*   GFR: Estimated Creatinine Clearance: 35 mL/min (A) (by C-G formula based on SCr of 1.6 mg/dL (H)). Liver Function Tests: Recent Labs  Lab 05/13/19 1933  AST 16  ALT 14  ALKPHOS 37*  BILITOT 0.9  PROT 6.3*  ALBUMIN 3.7   No results for input(s): LIPASE, AMYLASE in the last 168 hours. No results for input(s): AMMONIA in the last 168 hours. Coagulation Profile: Recent Labs  Lab 05/13/19 1933  INR 1.2   Cardiac Enzymes: No results for input(s): CKTOTAL, CKMB, CKMBINDEX, TROPONINI in the last 168 hours. BNP (last 3 results) No results for input(s): PROBNP in the last 8760 hours. HbA1C: No results for input(s): HGBA1C in the last 72 hours. CBG: No results for input(s): GLUCAP in the last 168 hours. Lipid Profile: No results for input(s): CHOL, HDL, LDLCALC, TRIG, CHOLHDL, LDLDIRECT in the last 72  hours. Thyroid Function Tests: Recent Labs    05/13/19 2118  TSH 2.445   Anemia Panel: No results for input(s): VITAMINB12, FOLATE, FERRITIN, TIBC, IRON, RETICCTPCT in the last 72 hours. Urine analysis:    Component Value Date/Time   COLORURINE YELLOW 05/13/2019 2152   APPEARANCEUR CLEAR 05/13/2019 2152   APPEARANCEUR Clear 05/27/2015 1351   LABSPEC 1.027 05/13/2019 2152   PHURINE 5.0 05/13/2019 2152   GLUCOSEU NEGATIVE 05/13/2019 2152   HGBUR MODERATE (A) 05/13/2019 2152   BILIRUBINUR NEGATIVE 05/13/2019 2152   BILIRUBINUR Negative 05/27/2015 1351  KETONESUR 5 (A) 05/13/2019 2152   PROTEINUR 100 (A) 05/13/2019 2152   NITRITE NEGATIVE 05/13/2019 2152   LEUKOCYTESUR NEGATIVE 05/13/2019 2152    Radiological Exams on Admission: Ct Code Stroke Cta Head W/wo Contrast  Result Date: 05/13/2019 CLINICAL DATA:  Initial evaluation for acute left-sided facial droop, slurred speech. EXAM: CT ANGIOGRAPHY HEAD AND NECK TECHNIQUE: Multidetector CT imaging of the head and neck was performed using the standard protocol during bolus administration of intravenous contrast. Multiplanar CT image reconstructions and MIPs were obtained to evaluate the vascular anatomy. Carotid stenosis measurements (when applicable) are obtained utilizing NASCET criteria, using the distal internal carotid diameter as the denominator. CONTRAST:  180mL OMNIPAQUE IOHEXOL 350 MG/ML SOLN COMPARISON:  Prior noncontrast head CT from earlier the same day. FINDINGS: CTA NECK FINDINGS Aortic arch: Visualized aortic arch of normal caliber with normal branch pattern. Moderate atherosclerotic change noted about the aortic arch and origin of the great vessels. Short-segment stenosis of approximately 70% noted at the proximal left subclavian artery, prior to the takeoff of the left vertebral artery (series 7, image 306). Visualized subclavian arteries otherwise widely patent. Right carotid system: Right common carotid artery tortuous  proximally but widely patent to the bifurcation without stenosis. Mild atheromatous irregularity about the right bifurcation without hemodynamically significant stenosis. Right ICA widely patent distally to the skull base without stenosis, dissection, or occlusion. Left carotid system: Mild approximate 30% narrowing at the origin of the left common carotid artery. Left CCA otherwise widely patent to the bifurcation without stenosis. Postoperative changes from prior endarterectomy about the left bifurcation. No residual hemodynamically significant stenosis at the origin of the left ICA. Approximate 50% stenosis noted at the origin of the left ECA. Left ICA widely patent distally to the skull base without stenosis, dissection, or occlusion. Vertebral arteries: Both of the vertebral arteries arise from the subclavian arteries. Focal plaque at the origin of the left vertebral artery with severe at least 80% stenosis. Left vertebral artery slightly dominant. Mild scattered atheromatous irregularity within the V2 segments bilaterally with mild to moderate stenoses. Vertebral arteries otherwise widely patent within the neck without acute abnormality. Skeleton: No acute osseous finding. No discrete lytic or blastic osseous lesions. Moderate multilevel cervical spondylolysis at C3-4 through C6-7. Degenerative changes noted about the TMJs bilaterally, right worse than left. Other neck: Approximate 2 cm cystic lesion within the subcutaneous fat of the right posterior neck most likely reflects a sebaceous cyst. Soft tissues of the neck demonstrate no acute finding. Upper chest: Unremarkable. Review of the MIP images confirms the above findings CTA HEAD FINDINGS Anterior circulation: Petrous segments widely patent. Mild scattered atheromatous plaque within the cavernous/supraclinoid ICAs without hemodynamically significant stenosis. ICA termini well perfused. A1 segments patent bilaterally. Right A1 hypoplastic, accounting for  the slightly diminutive right ICA is compared to the left. Normal anterior communicating artery. Anterior cerebral arteries patent to their distal aspects without stenosis or occlusion. No M1 stenosis or occlusion. Normal MCA bifurcations. Distal MCA branches well perfused and symmetric. Posterior circulation: Vertebral arteries patent to the vertebrobasilar junction without stenosis. Left vertebral artery dominant. Posteroinferior cerebral arteries patent bilaterally. Basilar widely patent to its distal aspect without stenosis. Superior cerebral arteries patent bilaterally. Both of the posterior cerebral arteries primarily supplied via the basilar and are widely patent to their distal aspects. Venous sinuses: Grossly patent allowing for timing of the contrast bolus. Anatomic variants: None significant.  No intracranial aneurysm. Delayed phase: Not performed. Review of the MIP images confirms the above  findings IMPRESSION: 1. Negative CTA for emergent large vessel occlusion. 2. Prior left carotid endarterectomy without residual or recurrent CCA/ICA stenosis. Persistent and/or recurrent 50% stenosis at the origin of the left ECA. 3. Short-segment severe at least 80% stenosis at the origin of the dominant left vertebral artery. Otherwise wide patency of the vertebrobasilar system. 4. Mild atherosclerotic change about the carotid siphons without hemodynamically significant or correctable stenosis. These results were communicated to Dr. Lorraine Lax at [place] 7/8/2020by text page via the [facility] messaging system. Electronically Signed   By: Jeannine Boga M.D.   On: 05/13/2019 20:38   Ct Code Stroke Cta Neck W/wo Contrast  Result Date: 05/13/2019 CLINICAL DATA:  Initial evaluation for acute left-sided facial droop, slurred speech. EXAM: CT ANGIOGRAPHY HEAD AND NECK TECHNIQUE: Multidetector CT imaging of the head and neck was performed using the standard protocol during bolus administration of intravenous contrast.  Multiplanar CT image reconstructions and MIPs were obtained to evaluate the vascular anatomy. Carotid stenosis measurements (when applicable) are obtained utilizing NASCET criteria, using the distal internal carotid diameter as the denominator. CONTRAST:  136mL OMNIPAQUE IOHEXOL 350 MG/ML SOLN COMPARISON:  Prior noncontrast head CT from earlier the same day. FINDINGS: CTA NECK FINDINGS Aortic arch: Visualized aortic arch of normal caliber with normal branch pattern. Moderate atherosclerotic change noted about the aortic arch and origin of the great vessels. Short-segment stenosis of approximately 70% noted at the proximal left subclavian artery, prior to the takeoff of the left vertebral artery (series 7, image 306). Visualized subclavian arteries otherwise widely patent. Right carotid system: Right common carotid artery tortuous proximally but widely patent to the bifurcation without stenosis. Mild atheromatous irregularity about the right bifurcation without hemodynamically significant stenosis. Right ICA widely patent distally to the skull base without stenosis, dissection, or occlusion. Left carotid system: Mild approximate 30% narrowing at the origin of the left common carotid artery. Left CCA otherwise widely patent to the bifurcation without stenosis. Postoperative changes from prior endarterectomy about the left bifurcation. No residual hemodynamically significant stenosis at the origin of the left ICA. Approximate 50% stenosis noted at the origin of the left ECA. Left ICA widely patent distally to the skull base without stenosis, dissection, or occlusion. Vertebral arteries: Both of the vertebral arteries arise from the subclavian arteries. Focal plaque at the origin of the left vertebral artery with severe at least 80% stenosis. Left vertebral artery slightly dominant. Mild scattered atheromatous irregularity within the V2 segments bilaterally with mild to moderate stenoses. Vertebral arteries otherwise  widely patent within the neck without acute abnormality. Skeleton: No acute osseous finding. No discrete lytic or blastic osseous lesions. Moderate multilevel cervical spondylolysis at C3-4 through C6-7. Degenerative changes noted about the TMJs bilaterally, right worse than left. Other neck: Approximate 2 cm cystic lesion within the subcutaneous fat of the right posterior neck most likely reflects a sebaceous cyst. Soft tissues of the neck demonstrate no acute finding. Upper chest: Unremarkable. Review of the MIP images confirms the above findings CTA HEAD FINDINGS Anterior circulation: Petrous segments widely patent. Mild scattered atheromatous plaque within the cavernous/supraclinoid ICAs without hemodynamically significant stenosis. ICA termini well perfused. A1 segments patent bilaterally. Right A1 hypoplastic, accounting for the slightly diminutive right ICA is compared to the left. Normal anterior communicating artery. Anterior cerebral arteries patent to their distal aspects without stenosis or occlusion. No M1 stenosis or occlusion. Normal MCA bifurcations. Distal MCA branches well perfused and symmetric. Posterior circulation: Vertebral arteries patent to the vertebrobasilar junction without stenosis.  Left vertebral artery dominant. Posteroinferior cerebral arteries patent bilaterally. Basilar widely patent to its distal aspect without stenosis. Superior cerebral arteries patent bilaterally. Both of the posterior cerebral arteries primarily supplied via the basilar and are widely patent to their distal aspects. Venous sinuses: Grossly patent allowing for timing of the contrast bolus. Anatomic variants: None significant.  No intracranial aneurysm. Delayed phase: Not performed. Review of the MIP images confirms the above findings IMPRESSION: 1. Negative CTA for emergent large vessel occlusion. 2. Prior left carotid endarterectomy without residual or recurrent CCA/ICA stenosis. Persistent and/or recurrent 50%  stenosis at the origin of the left ECA. 3. Short-segment severe at least 80% stenosis at the origin of the dominant left vertebral artery. Otherwise wide patency of the vertebrobasilar system. 4. Mild atherosclerotic change about the carotid siphons without hemodynamically significant or correctable stenosis. These results were communicated to Dr. Lorraine Lax at [place] 7/8/2020by text page via the [facility] messaging system. Electronically Signed   By: Jeannine Boga M.D.   On: 05/13/2019 20:38   Mr Brain Wo Contrast  Result Date: 05/13/2019 CLINICAL DATA:  Initial evaluation for acute stroke, slurred speech. EXAM: MRI HEAD WITHOUT CONTRAST TECHNIQUE: Multiplanar, multiecho pulse sequences of the brain and surrounding structures were obtained without intravenous contrast. COMPARISON:  Prior CT and CTA from earlier same day. FINDINGS: Brain: Examination technically limited as the patient was unable to tolerate the full length of the exam. Diffusion-weighted imaging, axial T2, FLAIR, and gradient echo sequences, as well as coronal T2 sequences only were performed. Additionally, images are markedly degraded by motion artifact. Generalized age-related cerebral atrophy with moderate chronic microvascular ischemic disease. No abnormal foci of restricted diffusion to suggest acute or subacute ischemia on this motion degraded exam. Gray-white matter differentiation maintained. No areas of remote cortical infarction. No evidence for acute intracranial hemorrhage. Single punctate chronic microhemorrhage noted at the anterior left frontal region, of doubtful significance in isolation. No mass lesion, midline shift or mass effect. No hydrocephalus. No extra-axial fluid collection. Vascular: Major intracranial vascular flow voids are maintained. Skull and upper cervical spine: Grossly negative on this limited exam. Sinuses/Orbits: Patient status post bilateral ocular lens replacement. Paranasal sinuses are largely clear. No  mastoid effusion. Other: None. IMPRESSION: 1. Technically limited exam due to patient's inability to tolerate the full length of the study as well as motion artifact. 2. No acute intracranial infarct or other abnormality. 3. Age-related cerebral atrophy with moderate chronic microvascular ischemic disease. Electronically Signed   By: Jeannine Boga M.D.   On: 05/13/2019 21:07   Dg Chest Portable 1 View  Result Date: 05/13/2019 CLINICAL DATA:  Confused and unresponsive. EXAM: PORTABLE CHEST 1 VIEW COMPARISON:  None. FINDINGS: Heart size is normal. Aortic atherosclerosis. Right lung is clear. There is volume loss and infiltrate in the left lower lobe suspicious for pneumonia. No visible effusion. No acute bone finding. IMPRESSION: Left lower lobe volume loss and infiltrate suspicious for pneumonia. Electronically Signed   By: Nelson Chimes M.D.   On: 05/13/2019 21:54   Ct Head Code Stroke Wo Contrast  Result Date: 05/13/2019 CLINICAL DATA:  Code stroke. Radiologist Dr. Maeola Harman of [facility] cannot cough and back item distension stroke arm yet is yet apparent again next month by EXAM: CT HEAD WITHOUT CONTRAST TECHNIQUE: Contiguous axial images were obtained from the base of the skull through the vertex without intravenous contrast. COMPARISON:  None available. FINDINGS: Brain: Generalized age-related cerebral atrophy with moderate chronic microvascular ischemic disease. No acute intracranial hemorrhage. No  acute large vessel territory infarct. No mass lesion, midline shift or mass effect. No hydrocephalus. No extra-axial fluid collection. Vascular: No hyperdense vessel. Calcified atherosclerosis at the skull base. Skull: Scalp soft tissues and calvarium within normal limits. Sinuses/Orbits: Globes and orbital soft tissues within normal limits. Mild scattered mucosal thickening within the ethmoidal air cells and maxillary sinuses. Paranasal sinuses are otherwise clear. No mastoid effusion. Other: None. ASPECTS  [facility] Stroke Program Early CT Score) - Ganglionic level infarction (caudate, lentiform nuclei, internal capsule, insula, M1-M3 cortex): 7 - Supraganglionic infarction (M4-M6 cortex): 3 Total score (0-10 with 10 being normal): 10 IMPRESSION: 1. No acute intracranial infarct or other abnormality identified. 2. ASPECTS is 10. 3. Age-related cerebral atrophy with moderate chronic microvascular ischemic disease. These results were communicated to Dr. Lorraine Lax at 7:52 [facility] 7/8/2020by text page via the [facility] messaging system. Electronically Signed   By: Jeannine Boga M.D.   On: 05/13/2019 19:54    EKG: Independently reviewed.  Assessment/Plan Principal Problem:   Acute encephalopathy Active Problems:   CAD S/P percutaneous coronary angioplasty   Community acquired pneumonia of left lower lobe of lung (HCC)   Abdominal tenderness, left lower quadrant    1. Acute encephalopathy - 1. 2 possible sources found thus far include the ? LLL PNA as well as the LLQ TTP 2. Tm 99 axial on my exam 3. LLL PNA - 1. PNA pathway 2. Rocephin / aizthro emperically for the moment 3. Cultures pending 4. LLQ TTP - 1. CT abd/pelvis w/o contrast to get a better look. 5. Ruled out for acute stroke on MRI (limited exam due to patient agitation but looks like they were able to get DWIs and those look negative for acute stroke) 6. Ammonia pending 7. TSH nl 8. Ativan PRN agitation, and may need a sitter as well. 9. SLP eval in AM 10. NPO for the moment 11. IVF: NS at 100 cc/hr 2. HTN - PRN hydralazine ordered  DVT prophylaxis: Lovenox Code Status: Full Family Communication: No family in room Disposition Plan: TBD Consults called: neurology Admission status: Admit to inpatient  Severity of Illness: The appropriate patient status for this patient is INPATIENT. Inpatient status is judged to be reasonable and necessary in order to provide the required intensity of service to ensure the patient's safety. The  patient's presenting symptoms, physical exam findings, and initial radiographic and laboratory data in the context of their chronic comorbidities is felt to place them at high risk for further clinical deterioration. Furthermore, it is not anticipated that the patient will be medically stable for discharge from the hospital within 2 midnights of admission. The following factors support the patient status of inpatient.   IP status for treatment of acute encephalopathy with significant change from baseline mentation.   * I certify that at the point of admission it is my clinical judgment that the patient will require inpatient hospital care spanning beyond 2 midnights from the point of admission due to high intensity of service, high risk for further deterioration and high frequency of surveillance required.*    Alyan Hartline M. DO [facility]  How to contact the [facility] Attending or Consulting provider [place] or covering provider during after hours [facility], for this patient?  1. Check the care team in [facility] and look for a) attending/consulting [facility] provider listed and b) the [facility] team listed 2. Log into [email or web address]  Amion Physician Scheduling and messaging for groups and whole hospitals  On call and physician scheduling  software for group practices, residents, hospitalists and other medical providers for call, clinic, rotation and shift schedules. On[employer] is a hospital-wide system for scheduling doctors and paging doctors on call. EasyPlot is for scientific plotting and data analysis.  [email or web address]  and use [facility]'s universal password to access. If you do not have the password, please contact the hospital operator.  3. Locate the [facility] provider you are looking for under [facility] and page to a number that you can be directly reached. 4. If you still have difficulty reaching the provider, please page the [facility] (Director on Call) for the Hospitalists listed on amion for  assistance.  05/14/2019, 12:56 AM

## 2019-05-14 NOTE — Progress Notes (Signed)
  Addendum : Late entry Pt from [facility] very lethargic and does not follow command   due to ativan 1 mg given for MRI. Per ed nurse note., pt was very confused impulsive and pulling off medical device and was not following.  Initial assessment done safety measures in place  Low bed and pt on  due medication given .Will continue to monitor.

## 2019-05-14 NOTE — Progress Notes (Signed)
EEG Completed; Results Pending  

## 2019-05-15 LAB — BASIC METABOLIC PANEL
Anion gap: 7 (ref 5–15)
BUN: 18 mg/dL (ref 8–23)
CO2: 25 mmol/L (ref 22–32)
Calcium: 8.6 mg/dL — ABNORMAL LOW (ref 8.9–10.3)
Chloride: 112 mmol/L — ABNORMAL HIGH (ref 98–111)
Creatinine, Ser: 1.64 mg/dL — ABNORMAL HIGH (ref 0.61–1.24)
GFR calc Af Amer: 43 mL/min — ABNORMAL LOW (ref >60)
GFR calc non Af Amer: 37 mL/min — ABNORMAL LOW (ref >60)
Glucose, Bld: 101 mg/dL — ABNORMAL HIGH (ref 70–99)
Potassium: 3.6 mmol/L (ref 3.5–5.1)
Sodium: 144 mmol/L (ref 135–145)

## 2019-05-15 LAB — RPR: RPR Ser Ql: NONREACTIVE

## 2019-05-15 MED ORDER — TRAMADOL HCL 50 MG PO TABS: 50.0000 mg | ORAL_TABLET | Freq: Four times a day (QID) | ORAL | Status: DC | PRN

## 2019-05-15 MED ORDER — ACETAMINOPHEN 325 MG PO TABS
650.0000 mg | ORAL_TABLET | Freq: Four times a day (QID) | ORAL | Status: DC | PRN
  Administered 2019-05-15: 650 mg via ORAL
  Filled 2019-05-15: qty 2

## 2019-05-15 NOTE — Progress Notes (Signed)
Subjective: [name] was laying in bed this morning.  He was much more alert and awake today and was able to answer appropriately.  He denied any complaints today, denied any cough, fevers, chills, recent sick contacts, or travel.  He does state that he has chronic shortness of breath. He was wondering when he would go home.   Exam: Vitals:   05/15/19 0720 05/15/19 1209  BP: (!) 159/119 (!) 155/68  Pulse: (!) 57 (!) 55  Resp: 16 16  Temp: 98.2 F (36.8 C) 98.4 F (36.9 C)  SpO2:  98%   Gen: In bed, NAD Resp: non-labored breathing, no acute distress Abd: soft, nt  Neuro: MS: Awake, alert, oriented to person, and year. Able to add quarter, nickle, and dime, and how many queters in dollar and 2.75, able to spell world forward but not backward, answered questions appropriately. Follows commands. CN: II: Pinpoint pupils, round, reactive to light and accommodation III,IV, VI: ptosis not present, EOMI V,VII: No obvious facial asymmetry VIII: Responds to voice IX,X: uvula rises symmetrically XI: bilateral shoulder shrug XII: midline tongue extension, preference towards left side  Motor: 5/5 in upper and lower extremities Tone and bulk:normal tone throughout; no atrophy noted Sensory: Light touch intact Deep Tendon Reflexes:  1+ throughout, equal Plantars: Right: downgoing   Left: downgoing Cerebellar: FTN intact with bilateral tremor Gait: unable to assess   Pertinent Labs: CMP showed Cr 1.74, appears to be close to baseline CBC showed no leukocytosis, chronic normocytic anemia with Hgb 10.5 CK 79 Ammonia 38 U/A showed no LE, WBC, nitrites TSH wnl  CT abdomen pelvis: Colonic diverticulosis, no evidence of diverticulitis, enlarged prostate gland, 8 mm lymph node anterior to prostate gland MRI brain: Limited exam, no acute intracranial infarct, age-related cerebral atrophy with moderate chronic microvascular ischemic disease  EEG: Mild nonspecific generalized cerebral  dysfunction, no seizure or seizure predisposition  Impression: This is a 84 year old male with a history of CAD, HTN, HLD who presented as stroke alert with AMS. CT head negative, CTA showed no evidence of LVO. MRI showed no acute findings, cerebral atrophy with moderate chronic microvascular ischemic disease. Patient has had no leukocytosis and has been afebrile.  However chest x-ray is concerning for possible left lower lobe pneumonia, given his age he may have a poor immune response and this could be contributing.  Lactic acid was normal. CK is within normal limits. Treated with antibiotics. He remains afebrile with no leukocytosis.   Today patient is much more alert and oriented, he is able to tell me his name, what year it is, not 1 month, his wife's name, how many quarters to tolerance and absence, will add a quarter, nickel, and dime, able to spell world forward however not backwards.  He has had significant improvement from initial presentation.  EEG was unremarkable.  Given his acute encephalopathy that has shown significant improvement since admission seems like this is likely due to metabolic causes versus medication induced.  Still concerned that the left lower lobe pneumonia could be contributing to his encephalopathy and would recommend continuing to treat.   Recommendations: 1) Continue treatment for pneumonia per primary 2) continue correcting electrolytes 3) PT/OT 4) no further work-up per neurology standpoint 5) neurology will sign off, please contact with further questions  Charles Sims, M.D. PGY2 05/15/2019 1:24 PM

## 2019-05-15 NOTE — Evaluation (Signed)
Physical Therapy Evaluation Patient Details Name: Charles Sims MRN: [id number] DOB: 18-Apr-1930 Today's Date: 05/15/2019   History of Present Illness  85 yo male with onset of general weakness with L facial droop and dysarthria , AMS was admitted and noted PNA.  Had clear UA but also cleared for Covid.  MRI did not show acute stroke changes, but does have visual hallucinations per pt the day of PT eval.   PMHx:  atherosclerosis, enlarged prostate, nearby nodes to prostate, diverticulosis,     Clinical Impression  Pt was seen for mobility and note his painful hips that are keeping him from standing and walking side of bed.  Pt is motivated to try but has visual hallucinations and saw water in front of the walker.  Sent a message to medical staff regarding hip pain, and will continue to follow pt with expectation that he is too dependent to be in a home situation. Will update his progress and reconsider home if more details can be learned from anyone regarding his PLOF.     Follow Up Recommendations SNF    Equipment Recommendations  None recommended by PT    Recommendations for Other Services       Precautions / Restrictions Precautions Precautions: Fall Precaution Comments: noted visual hallucinations today Restrictions Weight Bearing Restrictions: No      Mobility  Bed Mobility Overal bed mobility: Needs Assistance Bed Mobility: Supine to Sit;Sit to Supine     Supine to sit: Mod assist Sit to supine: Mod assist   General bed mobility comments: mod assist with bedrail  Transfers Overall transfer level: Needs assistance Equipment used: Rolling walker (2 wheeled);1 person hand held assist Transfers: Sit to/from Stand Sit to Stand: Mod assist         General transfer comment: mod with RW and pt cannot tolerate standing over his B hip pain, also noted the water in the floor (confused)  Ambulation/Gait             General Gait Details: attempted to walk a step but  hip pain hindered him  Stairs            Wheelchair Mobility    Modified Rankin (Stroke Patients Only)       Balance Overall balance assessment: Needs assistance Sitting-balance support: Feet supported Sitting balance-Leahy Scale: Poor     Standing balance support: Bilateral upper extremity supported;During functional activity Standing balance-Leahy Scale: Poor                               Pertinent Vitals/Pain Pain Assessment: Faces Faces Pain Scale: Hurts whole lot Pain Location: B hips but esp L in standing Pain Intervention(s): Monitored during session;Repositioned;Limited activity within patient's tolerance    Home Living Family/patient expects to be discharged to:: Unsure                 Additional Comments: pt cannot give details of home    Prior Function Level of Independence: (unsure)         Comments: pt cannot recall information about home     Hand Dominance        Extremity/Trunk Assessment   Upper Extremity Assessment Upper Extremity Assessment: Generalized weakness    Lower Extremity Assessment Lower Extremity Assessment: Generalized weakness    Cervical / Trunk Assessment Cervical / Trunk Assessment: Kyphotic  Communication   Communication: No difficulties  Cognition Arousal/Alertness: Lethargic Behavior During Therapy: Flat affect  Overall Cognitive Status: History of cognitive impairments - at baseline                                        General Comments General comments (skin integrity, edema, etc.): pt is inconsistent with sitting balance control with occas moments of fair balance but then is distracted and cannot control it.  Lists backward.    Exercises     Assessment/Plan    PT Assessment Patient needs continued PT services  PT Problem List Decreased strength;Decreased range of motion;Decreased activity tolerance;Decreased balance;Decreased mobility;Decreased  coordination;Decreased cognition;Decreased knowledge of use of DME;Decreased safety awareness;Cardiopulmonary status limiting activity;Pain       PT Treatment Interventions DME instruction;Gait training;Functional mobility training;Balance training;Therapeutic exercise;Therapeutic activities;Neuromuscular re-education;Patient/family education    PT Goals (Current goals can be found in the Care Plan section)  Acute Rehab PT Goals Patient Stated Goal: to get L hip to stop hurting PT Goal Formulation: With patient Time For Goal Achievement: 05/29/19 Potential to Achieve Goals: Good    Frequency Min 2X/week   Barriers to discharge Other (comment) pt is not able to stand reliably but also not sure of PLOF or his home situation    Co-evaluation               AM-PAC PT "6 Clicks" Mobility  Outcome Measure Help needed turning from your back to your side while in a flat bed without using bedrails?: A Little Help needed moving from lying on your back to sitting on the side of a flat bed without using bedrails?: A Lot Help needed moving to and from a bed to a chair (including a wheelchair)?: A Lot Help needed standing up from a chair using your arms (e.g., wheelchair or bedside chair)?: A Lot Help needed to walk in hospital room?: Total Help needed climbing 3-5 steps with a railing? : Total 6 Click Score: 11    End of Session Equipment Utilized During Treatment: Gait belt Activity Tolerance: Patient limited by fatigue;Patient limited by pain Patient left: in bed;with call bell/phone within reach;with bed alarm set Nurse Communication: Mobility status PT Visit Diagnosis: Unsteadiness on feet (R26.81);Pain;Muscle weakness (generalized) (M62.81) Pain - Right/Left: Left Pain - part of body: Hip    Time: 1694-5038 PT Time Calculation (min) (ACUTE ONLY): 28 min   Charges:   PT Evaluation $PT Eval Moderate Complexity: 1 Mod PT Treatments $Therapeutic Activity: 8-22 mins        Ramond Dial 05/15/2019, 4:36 PM   Mee Hives, PT MS Acute Rehab Dept. Number: [place] [facility] [place]

## 2019-05-15 NOTE — Progress Notes (Signed)
PROGRESS NOTE    Charles Sims  GDJ:[id number] DOB: 1929/12/28 DOA: 05/13/2019 PCP: [name], MD   Brief Narrative: Charles Sims is a 85 y.o. male with history of CAD, hypertension, carotid artery stenosis status post CEA.  Patient presented secondary to altered mental status and dysarthria.  Initial concern for acute CVA.   Assessment & Plan:   Principal Problem:   Acute encephalopathy Active Problems:   CAD S/P percutaneous coronary angioplasty   Community acquired pneumonia of left lower lobe of lung (HCC)   Abdominal tenderness, left lower quadrant   Confusion Etiology is still unknown.  Mental status is improved today but patient is still slightly confused and not at baseline.  So far, work-up has been negative.  No stroke on MRI.  EEG significant only for generalized slowing.  It is possible this is related to recent steroid use or back pain.  No improvement with Narcan in the ED or flumazenil while on the floor.  Infectious work-up has been negative to date other than possible left lower lobe pneumonia.   -Neurology recommendations pending today -Blood cultures pending  Left lower lobe pneumonia Patient is afebrile with no leukocytosis but significant confusion.  Possible this could be contributing.  Started on empiric antibiotics including ceftriaxone and azithromycin.  LLQ tenderness Does not appear to be causing issues at this time. CT significant for diverticulosis with no diverticulitis.  Hypertension -Continue hydralazine prn  Enlarged prostate Seen on CT scan. Family history of kidney cancer in father, per wife. -Outpatient follow-up   DVT prophylaxis: Lovenox Code Status:   Code Status: Full Code Family Communication: Wife on telephone Disposition Plan: Discharge pending continued medical management, likely in 1-2 days. PT/OT consult pending   Consultants:   Neurology  Procedures:   05/14/2019: EEG EEG Abnormalities: 1) mild irregular generalized  slow activity  Clinical Interpretation: This EEG is consistent with a mild nonspecific generalized cerebral dysfunction (encephalopathy). There was no seizure or seizure predisposition recorded on this study. Please note that lack of epileptiform activity on EEG does not preclude the possibility of epilepsy.   Antimicrobials:  Ceftriaxone (7/8>>  Azithromycin (7/8>>    Subjective: Wants to go home. No concerns.  Objective: Vitals:   05/14/19 1957 05/15/19 0002 05/15/19 0418 05/15/19 0720  BP: (!) 186/73 (!) 196/82 (!) 168/83 (!) 159/119  Pulse: (!) 58 (!) 55 (!) 55 (!) 57  Resp: 17 17 17 16   Temp: 98.3 F (36.8 C) 98.5 F (36.9 C) 98.2 F (36.8 C) 98.2 F (36.8 C)  TempSrc: Oral Oral Oral Oral  SpO2: 100% 98% 99%   Weight:      Height:        Intake/Output Summary (Last 24 hours) at 05/15/2019 1113 Last data filed at 05/15/2019 0421 Gross per 24 hour  Intake --  Output 900 ml  Net -900 ml   Filed Weights   05/13/19 1900 05/13/19 2239  Weight: 87.4 kg 87.4 kg    Examination:  General exam: Appears calm and comfortable Respiratory system: Clear to auscultation. Respiratory effort normal. Cardiovascular system: S1 & S2 heard, RRR. No murmurs, rubs, gallops or clicks. Gastrointestinal system: Abdomen is nondistended, soft and nontender. No organomegaly or masses felt. Normal bowel sounds heard. Central nervous system: Alert. No focal neurological deficits. Extremities: No edema. No calf tenderness Skin: No cyanosis. No rashes    Data Reviewed: I have personally reviewed following labs and imaging studies  CBC: Recent Labs  Lab 05/13/19 1933 05/13/19 1940 05/14/19  0720  WBC 6.8  --  7.8  NEUTROABS 5.8  --   --   HGB 11.3* 11.2* 10.5*  HCT 33.9* 33.0* 30.7*  MCV 92.6  --  90.3  PLT 224  --  195   Basic Metabolic Panel: Recent Labs  Lab 05/13/19 1933 05/13/19 1940 05/13/19 2118 05/14/19 0720  NA 141 141  --  143  K 3.9 3.9  --  3.5  CL 107 106   --  109  CO2 23  --   --  24  GLUCOSE 130* 122*  --  111*  BUN 22 22  --  21  CREATININE 1.87* 1.60*  --  1.74*  CALCIUM 9.6  --   --  9.1  MG  --   --  1.5*  --    GFR: Estimated Creatinine Clearance: 32.2 mL/min (A) (by C-G formula based on SCr of 1.74 mg/dL (H)). Liver Function Tests: Recent Labs  Lab 05/13/19 1933 05/14/19 0720  AST 16 16  ALT 14 12  ALKPHOS 37* 34*  BILITOT 0.9 0.7  PROT 6.3* 5.8*  ALBUMIN 3.7 3.4*   No results for input(s): LIPASE, AMYLASE in the last 168 hours. Recent Labs  Lab 05/13/19 2326  AMMONIA 38*   Coagulation Profile: Recent Labs  Lab 05/13/19 1933  INR 1.2   Cardiac Enzymes: Recent Labs  Lab 05/14/19 0203  CKTOTAL 79   BNP (last 3 results) No results for input(s): PROBNP in the last 8760 hours. HbA1C: No results for input(s): HGBA1C in the last 72 hours. CBG: No results for input(s): GLUCAP in the last 168 hours. Lipid Profile: No results for input(s): CHOL, HDL, LDLCALC, TRIG, CHOLHDL, LDLDIRECT in the last 72 hours. Thyroid Function Tests: Recent Labs    05/13/19 2118  TSH 2.445   Anemia Panel: No results for input(s): VITAMINB12, FOLATE, FERRITIN, TIBC, IRON, RETICCTPCT in the last 72 hours. Sepsis Labs: Recent Labs  Lab 05/13/19 2118 05/14/19 0203  PROCALCITON  --  <0.10  LATICACIDVEN 1.9 1.5    Recent Results (from the past 240 hour(s))  SARS Coronavirus 2 (CEPHEID- Performed in [facility] hospital lab), Hosp Order     Status: None   Collection Time: 05/13/19 11:26 PM   Specimen: Nasopharyngeal Swab  Result Value Ref Range Status   SARS Coronavirus 2 NEGATIVE NEGATIVE Final    Comment: (NOTE) If result is NEGATIVE SARS-CoV-2 target nucleic acids are NOT DETECTED. The SARS-CoV-2 RNA is generally detectable in upper and lower  respiratory specimens during the acute phase of infection. The lowest  concentration of SARS-CoV-2 viral copies this assay can detect is 250  copies / mL. A negative result does  not preclude SARS-CoV-2 infection  and should not be used as the sole basis for treatment or other  patient management decisions.  A negative result may occur with  improper specimen collection / handling, submission of specimen other  than nasopharyngeal swab, presence of viral mutation(s) within the  areas targeted by this assay, and inadequate number of viral copies  (<250 copies / mL). A negative result must be combined with clinical  observations, patient history, and epidemiological information. If result is POSITIVE SARS-CoV-2 target nucleic acids are DETECTED. The SARS-CoV-2 RNA is generally detectable in upper and lower  respiratory specimens dur ing the acute phase of infection.  Positive  results are indicative of active infection with SARS-CoV-2.  Clinical  correlation with patient history and other diagnostic information is  necessary to determine patient  infection status.  Positive results do  not rule out bacterial infection or co-infection with other viruses. If result is PRESUMPTIVE POSTIVE SARS-CoV-2 nucleic acids MAY BE PRESENT.   A presumptive positive result was obtained on the submitted specimen  and confirmed on repeat testing.  While 2019 novel coronavirus  (SARS-CoV-2) nucleic acids may be present in the submitted sample  additional confirmatory testing may be necessary for epidemiological  and / or clinical management purposes  to differentiate between  SARS-CoV-2 and other Sarbecovirus currently known to infect humans.  If clinically indicated additional testing with an alternate test  methodology [phone]) is advised. The SARS-CoV-2 RNA is generally  detectable in upper and lower respiratory sp ecimens during the acute  phase of infection. The expected result is Negative. Fact Sheet for Patients:  StrictlyIdeas.no Fact Sheet for Healthcare Providers: BankingDealers.co.za This test is not yet approved or  cleared by the [place] FDA and has been authorized for detection and/or diagnosis of SARS-CoV-2 by FDA under an Emergency Use Authorization (EUA).  This EUA will remain in effect (meaning this test can be used) for the duration of the COVID-19 declaration under Section 564(b)(1) of the Act, 21 U.S.C. section 360bbb-3(b)(1), unless the authorization is terminated or revoked sooner. Performed at [facility], [place] [address], [place], [place] [address]          Radiology Studies: Ct Abdomen Pelvis Wo Contrast  Result Date: 05/14/2019 CLINICAL DATA:  Left lower quadrant abdominal tenderness. EXAM: CT ABDOMEN AND PELVIS WITHOUT CONTRAST TECHNIQUE: Multidetector CT imaging of the abdomen and pelvis was performed following the standard protocol without IV contrast. COMPARISON:  None. FINDINGS: Lower chest: No confluent airspace disease or pleural fluid. Cardiomegaly. Hepatobiliary: No focal liver abnormality is seen. No gallstones, gallbladder wall thickening, or biliary dilatation. Pancreas: Parenchymal atrophy. No ductal dilatation or inflammation. Spleen: Normal in size without focal abnormality. Adrenals/Urinary Tract: 2.2 cm low-density right upper quadrant lesion abutting the medial limb of the right adrenal gland likely an exophytic adenoma, unchanged from prior exam. Normal left adrenal gland. Excreted IV contrast within both renal collecting systems from prior head CTA. Thinning of bilateral renal parenchyma. No hydronephrosis. Small cyst in the left kidney. Excreted IV contrast distends the urinary bladder. Trabeculated bladder. Stomach/Bowel: Small hiatal hernia. Stomach otherwise unremarkable. No small bowel inflammation, wall thickening, or obstruction. Normal appendix courses in the right upper quadrant. Multifocal colonic diverticulosis. No acute diverticulitis. Stool distends the rectum. Vascular/Lymphatic: Moderate to advanced aorta bi-iliac atherosclerosis. No aneurysm. There  is an 8 mm lymph node anterior to the prostate gland, increased in size from prior. Reproductive: Enlarged prostate gland measuring 6.2 x 5.9 x 6.3 cm (volume = 120 cm^3)with mass effect on the bladder base. Other: No free air, free fluid, or intra-abdominal fluid collection. Musculoskeletal: There are no acute or suspicious osseous abnormalities. IMPRESSION: 1. Colonic diverticulosis without evidence of acute diverticulitis. 2. Enlarged prostate gland, with mass effect on the bladder base. Recommend correlation with PSA. 3. An 8 mm lymph node anterior to the prostate gland, increased in size from prior exam, nonspecific. 4.  Aortic Atherosclerosis (ICD10-I70.0). Electronically Signed   By: Keith Rake M.D.   On: 05/14/2019 01:39   Ct Code Stroke Cta Head W/wo Contrast  Result Date: 05/13/2019 CLINICAL DATA:  Initial evaluation for acute left-sided facial droop, slurred speech. EXAM: CT ANGIOGRAPHY HEAD AND NECK TECHNIQUE: Multidetector CT imaging of the head and neck was performed using the standard protocol during bolus administration of  intravenous contrast. Multiplanar CT image reconstructions and MIPs were obtained to evaluate the vascular anatomy. Carotid stenosis measurements (when applicable) are obtained utilizing NASCET criteria, using the distal internal carotid diameter as the denominator. CONTRAST:  133mL OMNIPAQUE IOHEXOL 350 MG/ML SOLN COMPARISON:  Prior noncontrast head CT from earlier the same day. FINDINGS: CTA NECK FINDINGS Aortic arch: Visualized aortic arch of normal caliber with normal branch pattern. Moderate atherosclerotic change noted about the aortic arch and origin of the great vessels. Short-segment stenosis of approximately 70% noted at the proximal left subclavian artery, prior to the takeoff of the left vertebral artery (series 7, image 306). Visualized subclavian arteries otherwise widely patent. Right carotid system: Right common carotid artery tortuous proximally but widely  patent to the bifurcation without stenosis. Mild atheromatous irregularity about the right bifurcation without hemodynamically significant stenosis. Right ICA widely patent distally to the skull base without stenosis, dissection, or occlusion. Left carotid system: Mild approximate 30% narrowing at the origin of the left common carotid artery. Left CCA otherwise widely patent to the bifurcation without stenosis. Postoperative changes from prior endarterectomy about the left bifurcation. No residual hemodynamically significant stenosis at the origin of the left ICA. Approximate 50% stenosis noted at the origin of the left ECA. Left ICA widely patent distally to the skull base without stenosis, dissection, or occlusion. Vertebral arteries: Both of the vertebral arteries arise from the subclavian arteries. Focal plaque at the origin of the left vertebral artery with severe at least 80% stenosis. Left vertebral artery slightly dominant. Mild scattered atheromatous irregularity within the V2 segments bilaterally with mild to moderate stenoses. Vertebral arteries otherwise widely patent within the neck without acute abnormality. Skeleton: No acute osseous finding. No discrete lytic or blastic osseous lesions. Moderate multilevel cervical spondylolysis at C3-4 through C6-7. Degenerative changes noted about the TMJs bilaterally, right worse than left. Other neck: Approximate 2 cm cystic lesion within the subcutaneous fat of the right posterior neck most likely reflects a sebaceous cyst. Soft tissues of the neck demonstrate no acute finding. Upper chest: Unremarkable. Review of the MIP images confirms the above findings CTA HEAD FINDINGS Anterior circulation: Petrous segments widely patent. Mild scattered atheromatous plaque within the cavernous/supraclinoid ICAs without hemodynamically significant stenosis. ICA termini well perfused. A1 segments patent bilaterally. Right A1 hypoplastic, accounting for the slightly diminutive  right ICA is compared to the left. Normal anterior communicating artery. Anterior cerebral arteries patent to their distal aspects without stenosis or occlusion. No M1 stenosis or occlusion. Normal MCA bifurcations. Distal MCA branches well perfused and symmetric. Posterior circulation: Vertebral arteries patent to the vertebrobasilar junction without stenosis. Left vertebral artery dominant. Posteroinferior cerebral arteries patent bilaterally. Basilar widely patent to its distal aspect without stenosis. Superior cerebral arteries patent bilaterally. Both of the posterior cerebral arteries primarily supplied via the basilar and are widely patent to their distal aspects. Venous sinuses: Grossly patent allowing for timing of the contrast bolus. Anatomic variants: None significant.  No intracranial aneurysm. Delayed phase: Not performed. Review of the MIP images confirms the above findings IMPRESSION: 1. Negative CTA for emergent large vessel occlusion. 2. Prior left carotid endarterectomy without residual or recurrent CCA/ICA stenosis. Persistent and/or recurrent 50% stenosis at the origin of the left ECA. 3. Short-segment severe at least 80% stenosis at the origin of the dominant left vertebral artery. Otherwise wide patency of the vertebrobasilar system. 4. Mild atherosclerotic change about the carotid siphons without hemodynamically significant or correctable stenosis. These results were communicated to Dr. Lorraine Lax at 8:10  pmon 7/8/2020by text page via the [facility] messaging system. Electronically Signed   By: Jeannine Boga M.D.   On: 05/13/2019 20:38   Ct Code Stroke Cta Neck W/wo Contrast  Result Date: 05/13/2019 CLINICAL DATA:  Initial evaluation for acute left-sided facial droop, slurred speech. EXAM: CT ANGIOGRAPHY HEAD AND NECK TECHNIQUE: Multidetector CT imaging of the head and neck was performed using the standard protocol during bolus administration of intravenous contrast. Multiplanar CT image  reconstructions and MIPs were obtained to evaluate the vascular anatomy. Carotid stenosis measurements (when applicable) are obtained utilizing NASCET criteria, using the distal internal carotid diameter as the denominator. CONTRAST:  127mL OMNIPAQUE IOHEXOL 350 MG/ML SOLN COMPARISON:  Prior noncontrast head CT from earlier the same day. FINDINGS: CTA NECK FINDINGS Aortic arch: Visualized aortic arch of normal caliber with normal branch pattern. Moderate atherosclerotic change noted about the aortic arch and origin of the great vessels. Short-segment stenosis of approximately 70% noted at the proximal left subclavian artery, prior to the takeoff of the left vertebral artery (series 7, image 306). Visualized subclavian arteries otherwise widely patent. Right carotid system: Right common carotid artery tortuous proximally but widely patent to the bifurcation without stenosis. Mild atheromatous irregularity about the right bifurcation without hemodynamically significant stenosis. Right ICA widely patent distally to the skull base without stenosis, dissection, or occlusion. Left carotid system: Mild approximate 30% narrowing at the origin of the left common carotid artery. Left CCA otherwise widely patent to the bifurcation without stenosis. Postoperative changes from prior endarterectomy about the left bifurcation. No residual hemodynamically significant stenosis at the origin of the left ICA. Approximate 50% stenosis noted at the origin of the left ECA. Left ICA widely patent distally to the skull base without stenosis, dissection, or occlusion. Vertebral arteries: Both of the vertebral arteries arise from the subclavian arteries. Focal plaque at the origin of the left vertebral artery with severe at least 80% stenosis. Left vertebral artery slightly dominant. Mild scattered atheromatous irregularity within the V2 segments bilaterally with mild to moderate stenoses. Vertebral arteries otherwise widely patent within  the neck without acute abnormality. Skeleton: No acute osseous finding. No discrete lytic or blastic osseous lesions. Moderate multilevel cervical spondylolysis at C3-4 through C6-7. Degenerative changes noted about the TMJs bilaterally, right worse than left. Other neck: Approximate 2 cm cystic lesion within the subcutaneous fat of the right posterior neck most likely reflects a sebaceous cyst. Soft tissues of the neck demonstrate no acute finding. Upper chest: Unremarkable. Review of the MIP images confirms the above findings CTA HEAD FINDINGS Anterior circulation: Petrous segments widely patent. Mild scattered atheromatous plaque within the cavernous/supraclinoid ICAs without hemodynamically significant stenosis. ICA termini well perfused. A1 segments patent bilaterally. Right A1 hypoplastic, accounting for the slightly diminutive right ICA is compared to the left. Normal anterior communicating artery. Anterior cerebral arteries patent to their distal aspects without stenosis or occlusion. No M1 stenosis or occlusion. Normal MCA bifurcations. Distal MCA branches well perfused and symmetric. Posterior circulation: Vertebral arteries patent to the vertebrobasilar junction without stenosis. Left vertebral artery dominant. Posteroinferior cerebral arteries patent bilaterally. Basilar widely patent to its distal aspect without stenosis. Superior cerebral arteries patent bilaterally. Both of the posterior cerebral arteries primarily supplied via the basilar and are widely patent to their distal aspects. Venous sinuses: Grossly patent allowing for timing of the contrast bolus. Anatomic variants: None significant.  No intracranial aneurysm. Delayed phase: Not performed. Review of the MIP images confirms the above findings IMPRESSION: 1. Negative CTA for  emergent large vessel occlusion. 2. Prior left carotid endarterectomy without residual or recurrent CCA/ICA stenosis. Persistent and/or recurrent 50% stenosis at the  origin of the left ECA. 3. Short-segment severe at least 80% stenosis at the origin of the dominant left vertebral artery. Otherwise wide patency of the vertebrobasilar system. 4. Mild atherosclerotic change about the carotid siphons without hemodynamically significant or correctable stenosis. These results were communicated to Dr. Lorraine Lax at [facility] 7/8/2020by text page via the [facility] messaging system. Electronically Signed   By: Jeannine Boga M.D.   On: 05/13/2019 20:38   Mr Brain Wo Contrast  Result Date: 05/13/2019 CLINICAL DATA:  Initial evaluation for acute stroke, slurred speech. EXAM: MRI HEAD WITHOUT CONTRAST TECHNIQUE: Multiplanar, multiecho pulse sequences of the brain and surrounding structures were obtained without intravenous contrast. COMPARISON:  Prior CT and CTA from earlier same day. FINDINGS: Brain: Examination technically limited as the patient was unable to tolerate the full length of the exam. Diffusion-weighted imaging, axial T2, FLAIR, and gradient echo sequences, as well as coronal T2 sequences only were performed. Additionally, images are markedly degraded by motion artifact. Generalized age-related cerebral atrophy with moderate chronic microvascular ischemic disease. No abnormal foci of restricted diffusion to suggest acute or subacute ischemia on this motion degraded exam. Gray-white matter differentiation maintained. No areas of remote cortical infarction. No evidence for acute intracranial hemorrhage. Single punctate chronic microhemorrhage noted at the anterior left frontal region, of doubtful significance in isolation. No mass lesion, midline shift or mass effect. No hydrocephalus. No extra-axial fluid collection. Vascular: Major intracranial vascular flow voids are maintained. Skull and upper cervical spine: Grossly negative on this limited exam. Sinuses/Orbits: Patient status post bilateral ocular lens replacement. Paranasal sinuses are largely clear. No mastoid effusion.  Other: None. IMPRESSION: 1. Technically limited exam due to patient's inability to tolerate the full length of the study as well as motion artifact. 2. No acute intracranial infarct or other abnormality. 3. Age-related cerebral atrophy with moderate chronic microvascular ischemic disease. Electronically Signed   By: Jeannine Boga M.D.   On: 05/13/2019 21:07   Dg Chest Portable 1 View  Result Date: 05/13/2019 CLINICAL DATA:  Confused and unresponsive. EXAM: PORTABLE CHEST 1 VIEW COMPARISON:  None. FINDINGS: Heart size is normal. Aortic atherosclerosis. Right lung is clear. There is volume loss and infiltrate in the left lower lobe suspicious for pneumonia. No visible effusion. No acute bone finding. IMPRESSION: Left lower lobe volume loss and infiltrate suspicious for pneumonia. Electronically Signed   By: Nelson Chimes M.D.   On: 05/13/2019 21:54   Ct Head Code Stroke Wo Contrast  Result Date: 05/13/2019 CLINICAL DATA:  Code stroke. Radiologist Dr. Maeola Harman of [facility] cannot cough and back item distension stroke arm yet is yet apparent again next month by EXAM: CT HEAD WITHOUT CONTRAST TECHNIQUE: Contiguous axial images were obtained from the base of the skull through the vertex without intravenous contrast. COMPARISON:  None available. FINDINGS: Brain: Generalized age-related cerebral atrophy with moderate chronic microvascular ischemic disease. No acute intracranial hemorrhage. No acute large vessel territory infarct. No mass lesion, midline shift or mass effect. No hydrocephalus. No extra-axial fluid collection. Vascular: No hyperdense vessel. Calcified atherosclerosis at the skull base. Skull: Scalp soft tissues and calvarium within normal limits. Sinuses/Orbits: Globes and orbital soft tissues within normal limits. Mild scattered mucosal thickening within the ethmoidal air cells and maxillary sinuses. Paranasal sinuses are otherwise clear. No mastoid effusion. Other: None. ASPECTS [facility] Stroke  Program Early CT Score) - Ganglionic  level infarction (caudate, lentiform nuclei, internal capsule, insula, M1-M3 cortex): 7 - Supraganglionic infarction (M4-M6 cortex): 3 Total score (0-10 with 10 being normal): 10 IMPRESSION: 1. No acute intracranial infarct or other abnormality identified. 2. ASPECTS is 10. 3. Age-related cerebral atrophy with moderate chronic microvascular ischemic disease. These results were communicated to Dr. Lorraine Lax at 7:52 pmon 7/8/2020by text page via the [facility] messaging system. Electronically Signed   By: Jeannine Boga M.D.   On: 05/13/2019 19:54        Scheduled Meds:  enoxaparin (LOVENOX) injection  40 mg Subcutaneous Q24H   sodium chloride flush  3 mL Intravenous Once   Continuous Infusions:  sodium chloride 100 mL/hr at 05/14/19 2145   azithromycin 500 mg (05/15/19 0157)   cefTRIAXone (ROCEPHIN)  IV 2 g (05/14/19 2358)     LOS: 1 day     Cordelia Poche, MD [facility] 05/15/2019, 11:13 AM  If 7PM-7AM, please contact night-coverage [email or web address]

## 2019-05-15 NOTE — Plan of Care (Signed)
  Problem: Education: Goal: Knowledge of General Education information will improve Description Including pain rating scale, medication(s)/side effects and non-pharmacologic comfort measures Outcome: Progressing   

## 2019-05-15 NOTE — Evaluation (Signed)
Clinical/Bedside Swallow Evaluation Patient Details  Name: Charles Sims MRN: [id number] Date of Birth: [birth year]  Today's Date: 05/15/2019 Time: SLP Start Time (ACUTE ONLY): 36 SLP Stop Time (ACUTE ONLY): 1030 SLP Time Calculation (min) (ACUTE ONLY): 25 min  Past Medical History:  Past Medical History:  Diagnosis Date  . Adenoma of right adrenal gland   . Arthritis   . Benign prostatic hypertrophy with lower urinary tract symptoms (LUTS)   . Bladder calculi   . Bradycardia   . CAD (coronary artery disease)   . Carotid stenosis   . Coronary artery disease   . Dysrhythmia    bradycardia  . Elevated PSA   . GERD (gastroesophageal reflux disease)   . Gross hematuria   . Hyperlipidemia   . Hypertension   . Prostatitis   . Shortness of breath dyspnea   . Urinary frequency    Past Surgical History:  Past Surgical History:  Procedure Laterality Date  . CAROTID ENDARTERECTOMY Left   . CYSTOSCOPY WITH LITHOLAPAXY N/A 04/13/2015   Procedure: CYSTOSCOPY WITH LITHOLAPAXY;  Surgeon: Hollice Espy, MD;  Location: [facility] ORS;  Service: Urology;  Laterality: N/A;   HPI:  Patient is an 85 y.o. male with PMH: CAD, HTN, carotid artery stenosis s/p CEA who presented to hospital with AMS and dysarthria and initial concern for CVA. CXR revealed Left lower lobe volume loss and infiltrate suspicious for pneumonia, MRI was negative for acute CVA.   Assessment / Plan / Recommendation Clinical Impression  Patient presents with an oropharyngeal swallow that is [facility] and without any overt s/s of aspiration or penetration. Swallow initiation was timely, pharyngeal contraction and laryngeal elevation both WFL as per palpation and oral cavity was clear after puree and regular texture PO's.Patient is safe to start regular diet with thin liquids and no SLP f/u warranted. SLP Visit Diagnosis: Dysphagia, unspecified (R13.10)    Aspiration Risk  No limitations    Diet Recommendation Regular;Thin liquid    Liquid Administration via: Straw;Cup Medication Administration: Whole meds with liquid Supervision: Patient able to self feed;Comment(left arm tremor especially when using utensils) Compensations: Minimize environmental distractions;Slow rate;Small sips/bites Postural Changes: Seated upright at 90 degrees    Other  Recommendations Oral Care Recommendations: Oral care BID   Follow up Recommendations None      Frequency and Duration   N/A         Prognosis   N/A     Swallow Study   General Date of Onset: 05/14/19 HPI: Patient is an 85 y.o. male with PMH: CAD, HTN, carotid artery stenosis s/p CEA who presented to hospital with AMS and dysarthria and initial concern for CVA. CXR revealed Left lower lobe volume loss and infiltrate suspicious for pneumonia, MRI was negative for acute CVA. Type of Study: Bedside Swallow Evaluation Previous Swallow Assessment: N/A Diet Prior to this Study: NPO Temperature Spikes Noted: No Respiratory Status: Room air History of Recent Intubation: No Behavior/Cognition: Alert;Cooperative;Pleasant mood Oral Cavity Assessment: Within Functional Limits Oral Care Completed by SLP: No Oral Cavity - Dentition: Poor condition Vision: Functional for self-feeding Self-Feeding Abilities: Able to feed self Patient Positioning: Upright in bed Baseline Vocal Quality: Normal Volitional Cough: Strong Volitional Swallow: Unable to elicit    Oral/Motor/Sensory Function Overall Oral Motor/Sensory Function: Within functional limits   Ice Chips     Thin Liquid Thin Liquid: Within functional limits Presentation: Straw;Self Fed Other Comments: No overt s/s of aspiration or penetration.    Nectar Thick  Honey Thick     Puree Puree: Within functional limits Presentation: Self Fed;Spoon   Solid     Solid: Within functional limits      [name] 05/15/2019,1:10 PM  Sonia Baller, MA, CCC-SLP Speech Therapy [facility] Acute Rehab Pager:  [phone]

## 2019-05-16 ENCOUNTER — Inpatient Hospital Stay (HOSPITAL_COMMUNITY): Payer: Medicare HMO

## 2019-05-16 LAB — BASIC METABOLIC PANEL
Anion gap: 8 (ref 5–15)
BUN: 20 mg/dL (ref 8–23)
CO2: 22 mmol/L (ref 22–32)
Calcium: 8.7 mg/dL — ABNORMAL LOW (ref 8.9–10.3)
Chloride: 112 mmol/L — ABNORMAL HIGH (ref 98–111)
Creatinine, Ser: 1.62 mg/dL — ABNORMAL HIGH (ref 0.61–1.24)
GFR calc Af Amer: 44 mL/min — ABNORMAL LOW (ref >60)
GFR calc non Af Amer: 38 mL/min — ABNORMAL LOW (ref >60)
Glucose, Bld: 113 mg/dL — ABNORMAL HIGH (ref 70–99)
Potassium: 3.4 mmol/L — ABNORMAL LOW (ref 3.5–5.1)
Sodium: 142 mmol/L (ref 135–145)

## 2019-05-16 MED ORDER — ACETAMINOPHEN 500 MG PO TABS
1000.0000 mg | ORAL_TABLET | Freq: Three times a day (TID) | ORAL | Status: DC | PRN
  Administered 2019-05-16: 1000 mg via ORAL
  Filled 2019-05-16: qty 2

## 2019-05-16 MED ORDER — CEFDINIR 300 MG PO CAPS: 300.0000 mg | ORAL_CAPSULE | Freq: Two times a day (BID) | ORAL | 0 refills | Status: AC

## 2019-05-16 MED ORDER — AMLODIPINE BESYLATE 5 MG PO TABS
5.0000 mg | ORAL_TABLET | Freq: Every day | ORAL | Status: DC
  Administered 2019-05-16: 5 mg via ORAL
  Filled 2019-05-16 (×2): qty 1

## 2019-05-16 MED ORDER — AZITHROMYCIN 250 MG PO TABS: 250.0000 mg | ORAL_TABLET | Freq: Every day | ORAL | 0 refills | Status: AC

## 2019-05-16 MED ORDER — AMLODIPINE BESYLATE 5 MG PO TABS: 5.0000 mg | ORAL_TABLET | Freq: Every day | ORAL | 0 refills | Status: DC

## 2019-05-16 MED ORDER — ACETAMINOPHEN 500 MG PO TABS: 500.0000 mg | ORAL_TABLET | Freq: Three times a day (TID) | ORAL | 0 refills | Status: DC | PRN

## 2019-05-16 MED ORDER — POTASSIUM CHLORIDE CRYS ER 20 MEQ PO TBCR
40.0000 meq | EXTENDED_RELEASE_TABLET | Freq: Once | ORAL | Status: AC
  Administered 2019-05-16: 40 meq via ORAL
  Filled 2019-05-16: qty 2

## 2019-05-16 MED ORDER — DICLOFENAC SODIUM 1 % TD GEL
4.0000 g | Freq: Four times a day (QID) | TRANSDERMAL | Status: DC
  Administered 2019-05-16 (×2): 4 g via TOPICAL
  Filled 2019-05-16: qty 100

## 2019-05-16 MED ORDER — DICLOFENAC SODIUM 1 % TD GEL: 4.0000 g | Freq: Four times a day (QID) | TRANSDERMAL | 0 refills | Status: DC

## 2019-05-16 NOTE — TOC Transition Note (Signed)
Transition of Care [facility]) - CM/SW Discharge Note   Patient Details  Name: BRODRIC SCHAUER MRN: [id number] Date of Birth: [birth year]  Transition of Care [facility]) CM/SW Contact:  Carles Collet, RN Phone Number: 05/16/2019, 4:05 PM   Clinical Narrative:    Discussed case w Dr Lonny Prude, patient to DC to home w [facility], declining SNF. Spoke w wife who passed phone to son, they understand patient is recommended to go to SNF, they decline and want HH. They sate they have used [facility] in the past and would like to use them again. Referral made to [facility]. Son states that they have all needed DME at home for ambulation assist and bathroom safety from needing itfor patient's wife in the past. Son states that he will be able to pick up patient at hospital. He was provided with number to nurse's station to set up pick up time.     Final next level of care: [place] Barriers to Discharge: No Barriers Identified   Patient Goals and CMS Choice Patient states their goals for this hospitalization and ongoing recovery are:: to return home CMS Medicare.gov Compare Post Acute Care list provided to:: Patient Choice offered to / list presented to : Patient  Discharge Placement                       Discharge Plan and Services                          HH Arranged: PT, OT [facility] Agency: [place] ([facility]) Date [place]: 05/16/19 Time [place]: [place] Representative spoke with at [place]: [place] ([place]) Interventions     Readmission Risk Interventions No flowsheet data found.

## 2019-05-16 NOTE — Progress Notes (Signed)
Patient being discharged home with home health. Education and instructions given to patient. IV removed. All belongings with patient. Patient leaving unit via wheelchair.

## 2019-05-16 NOTE — Discharge Instructions (Signed)
Charles Sims,  You are in the hospital because of confusion.  This appears to have been secondary to either pneumonia or possibly your steroids.  Your confusion has improved.  There have been changes to your medications.  Please adhere to them.  I have added new blood pressure medication called amlodipine.  I recommend not continuing your losartan and hydrochlorothiazide secondary to kidney disease.  My suspicion that your kidney disease is chronic.  You will need to follow-up with your primary care physician.  With regard to hip pain, there is no fracture on your x-ray.  Since this is chronic, you will have to follow-up with your primary care physician for any stronger pain medications.  I recommend not using any NSAIDs, (examples include ibuprofen, Advil, naproxen, Aleve, Mobic) as these can cause issues with your kidney function.  Physical therapy worked with you and recommended skilled nursing facility placement, however, you have declined this option.  You will be sent home with home health therapy.

## 2019-05-16 NOTE — Discharge Summary (Addendum)
Physician Discharge Summary  JAMUS LOVING YDX:[id number] DOB: 02-02-1930 DOA: 05/13/2019  PCP: [name], MD  Admit date: 05/13/2019 Discharge date: 05/16/2019  Admitted From: Home Disposition: Home  Recommendations for Outpatient Follow-up:  1. Follow up with PCP in 1 week 2. Please obtain BMP/CBC in one week 3. Please follow up on the following pending results: Blood culture  Home Health: PT, OT Equipment/Devices: Rolling walker  Discharge Condition: Stable CODE STATUS: Full code Diet recommendation: Heart healthy  Brief/Interim Summary:  Admission HPI written by Etta Quill, DO   Chief Complaint: AMS  HPI: STATON MARKEY is a 85 y.o. male with medical history significant of CAD, HTN, carotid stenosis s/p CEA.  Patient found by wife laying in bed at around 29 with AMS, dysarthria, and reported facial droop to the left.  Patient MAE but not following commands nor answering questions appropriately.  This is a significant change for patient.  At baseline (earlier today) patient is apparently walking, talking, and doing most or all ADLs independently still according to Son.    Hospital course:  Confusion Etiology is still unknown.  Mental status is improved today but patient is still slightly confused and not at baseline.  So far, work-up has been negative.  No stroke on MRI.  EEG significant only for generalized slowing.  It is possible this is related to recent steroid use or back pain.  No improvement with Narcan in the ED or flumazenil while on the floor.  Infectious work-up has been negative to date other than possible left lower lobe pneumonia.  Mental status improved prior to discharge.  Blood cultures pending on discharge.  Left lower lobe pneumonia Patient is afebrile with no leukocytosis but significant confusion.  Possible this could be contributing.  Started on empiric antibiotics including ceftriaxone and azithromycin.  Transition to cefdinir and  azithromycin on discharge.  LLQ tenderness Does not appear to be causing issues at this time. CT significant for diverticulosis with no diverticulitis.  Symptoms are more related to left hip/pelvis for which she has had a chronic issue with.  Hypertension Uncontrolled while inpatient secondary to holding his home losartan and hydrochlorothiazide.  Started him on amlodipine on discharge.  Will need close monitoring as an outpatient.  Enlarged prostate Seen on CT scan. Family history of kidney cancer in father, per wife. PSA elevated. Discussed finding with wife. Outpatient follow-up.  CKD stage III Likely diagnosis. Creatinine has remained stable with no acid-base disturbances. BUN stable. Does not appear to be chronic. Outpatient follow-up. Renally adjust medications. Discussed no NSAIDs.   Discharge Diagnoses:  Principal Problem:   Acute encephalopathy Active Problems:   CAD S/P percutaneous coronary angioplasty   Community acquired pneumonia of left lower lobe of lung (HCC)   Abdominal tenderness, left lower quadrant    Discharge Instructions  Discharge Instructions    Diet - low sodium heart healthy   Complete by: As directed    Increase activity slowly   Complete by: As directed      Allergies as of 05/16/2019      Reactions   Lisinopril Cough      Medication List    STOP taking these medications   losartan-hydrochlorothiazide 50-12.5 MG tablet Commonly known as: HYZAAR   meloxicam 15 MG tablet Commonly known as: MOBIC   methylPREDNISolone 4 MG Tbpk tablet Commonly known as: MEDROL DOSEPAK     TAKE these medications   acetaminophen 500 MG tablet Commonly known as: TYLENOL Take 1-2  tablets (500-1,000 mg total) by mouth every 8 (eight) hours as needed for mild pain or moderate pain.   amLODipine 5 MG tablet Commonly known as: NORVASC Take 1 tablet (5 mg total) by mouth daily.   aspirin EC 81 MG tablet Take 81 mg by mouth daily.   azithromycin 250 MG  tablet Commonly known as: Zithromax Take 1 tablet (250 mg total) by mouth daily for 2 days. Start taking on: May 17, 2019   cefdinir 300 MG capsule Commonly known as: OMNICEF Take 1 capsule (300 mg total) by mouth 2 (two) times daily for 4 days. Start taking on: May 17, 2019   diclofenac sodium 1 % Gel Commonly known as: VOLTAREN Apply 4 g topically 4 (four) times daily. Apply to hip   loperamide 2 MG tablet Commonly known as: IMODIUM A-D Take 2 mg by mouth daily.   omeprazole 20 MG capsule Commonly known as: PRILOSEC Take 20 mg by mouth daily.   simvastatin 20 MG tablet Commonly known as: ZOCOR Take 20 mg by mouth daily.   traMADol 50 MG tablet Commonly known as: ULTRAM Take 50 mg by mouth every 6 (six) hours as needed for moderate pain.      Follow-up Information    [name], MD. Schedule an appointment as soon as possible for a visit in 1 week(s).   Specialty: Internal Medicine Contact information: [place] [phone]          Allergies  Allergen Reactions   Lisinopril Cough    Consultations:  Neurology   Procedures/Studies: Ct Abdomen Pelvis Wo Contrast  Result Date: 05/14/2019 CLINICAL DATA:  Left lower quadrant abdominal tenderness. EXAM: CT ABDOMEN AND PELVIS WITHOUT CONTRAST TECHNIQUE: Multidetector CT imaging of the abdomen and pelvis was performed following the standard protocol without IV contrast. COMPARISON:  None. FINDINGS: Lower chest: No confluent airspace disease or pleural fluid. Cardiomegaly. Hepatobiliary: No focal liver abnormality is seen. No gallstones, gallbladder wall thickening, or biliary dilatation. Pancreas: Parenchymal atrophy. No ductal dilatation or inflammation. Spleen: Normal in size without focal abnormality. Adrenals/Urinary Tract: 2.2 cm low-density right upper quadrant lesion abutting the medial limb of the right adrenal gland likely an exophytic adenoma, unchanged from prior exam.  Normal left adrenal gland. Excreted IV contrast within both renal collecting systems from prior head CTA. Thinning of bilateral renal parenchyma. No hydronephrosis. Small cyst in the left kidney. Excreted IV contrast distends the urinary bladder. Trabeculated bladder. Stomach/Bowel: Small hiatal hernia. Stomach otherwise unremarkable. No small bowel inflammation, wall thickening, or obstruction. Normal appendix courses in the right upper quadrant. Multifocal colonic diverticulosis. No acute diverticulitis. Stool distends the rectum. Vascular/Lymphatic: Moderate to advanced aorta bi-iliac atherosclerosis. No aneurysm. There is an 8 mm lymph node anterior to the prostate gland, increased in size from prior. Reproductive: Enlarged prostate gland measuring 6.2 x 5.9 x 6.3 cm (volume = 120 cm^3)with mass effect on the bladder base. Other: No free air, free fluid, or intra-abdominal fluid collection. Musculoskeletal: There are no acute or suspicious osseous abnormalities. IMPRESSION: 1. Colonic diverticulosis without evidence of acute diverticulitis. 2. Enlarged prostate gland, with mass effect on the bladder base. Recommend correlation with PSA. 3. An 8 mm lymph node anterior to the prostate gland, increased in size from prior exam, nonspecific. 4.  Aortic Atherosclerosis (ICD10-I70.0). Electronically Signed   By: Keith Rake M.D.   On: 05/14/2019 01:39   Ct Code Stroke Cta Head W/wo Contrast  Result Date: 05/13/2019 CLINICAL DATA:  Initial evaluation  for acute left-sided facial droop, slurred speech. EXAM: CT ANGIOGRAPHY HEAD AND NECK TECHNIQUE: Multidetector CT imaging of the head and neck was performed using the standard protocol during bolus administration of intravenous contrast. Multiplanar CT image reconstructions and MIPs were obtained to evaluate the vascular anatomy. Carotid stenosis measurements (when applicable) are obtained utilizing NASCET criteria, using the distal internal carotid diameter as the  denominator. CONTRAST:  143mL OMNIPAQUE IOHEXOL 350 MG/ML SOLN COMPARISON:  Prior noncontrast head CT from earlier the same day. FINDINGS: CTA NECK FINDINGS Aortic arch: Visualized aortic arch of normal caliber with normal branch pattern. Moderate atherosclerotic change noted about the aortic arch and origin of the great vessels. Short-segment stenosis of approximately 70% noted at the proximal left subclavian artery, prior to the takeoff of the left vertebral artery (series 7, image 306). Visualized subclavian arteries otherwise widely patent. Right carotid system: Right common carotid artery tortuous proximally but widely patent to the bifurcation without stenosis. Mild atheromatous irregularity about the right bifurcation without hemodynamically significant stenosis. Right ICA widely patent distally to the skull base without stenosis, dissection, or occlusion. Left carotid system: Mild approximate 30% narrowing at the origin of the left common carotid artery. Left CCA otherwise widely patent to the bifurcation without stenosis. Postoperative changes from prior endarterectomy about the left bifurcation. No residual hemodynamically significant stenosis at the origin of the left ICA. Approximate 50% stenosis noted at the origin of the left ECA. Left ICA widely patent distally to the skull base without stenosis, dissection, or occlusion. Vertebral arteries: Both of the vertebral arteries arise from the subclavian arteries. Focal plaque at the origin of the left vertebral artery with severe at least 80% stenosis. Left vertebral artery slightly dominant. Mild scattered atheromatous irregularity within the V2 segments bilaterally with mild to moderate stenoses. Vertebral arteries otherwise widely patent within the neck without acute abnormality. Skeleton: No acute osseous finding. No discrete lytic or blastic osseous lesions. Moderate multilevel cervical spondylolysis at C3-4 through C6-7. Degenerative changes noted  about the TMJs bilaterally, right worse than left. Other neck: Approximate 2 cm cystic lesion within the subcutaneous fat of the right posterior neck most likely reflects a sebaceous cyst. Soft tissues of the neck demonstrate no acute finding. Upper chest: Unremarkable. Review of the MIP images confirms the above findings CTA HEAD FINDINGS Anterior circulation: Petrous segments widely patent. Mild scattered atheromatous plaque within the cavernous/supraclinoid ICAs without hemodynamically significant stenosis. ICA termini well perfused. A1 segments patent bilaterally. Right A1 hypoplastic, accounting for the slightly diminutive right ICA is compared to the left. Normal anterior communicating artery. Anterior cerebral arteries patent to their distal aspects without stenosis or occlusion. No M1 stenosis or occlusion. Normal MCA bifurcations. Distal MCA branches well perfused and symmetric. Posterior circulation: Vertebral arteries patent to the vertebrobasilar junction without stenosis. Left vertebral artery dominant. Posteroinferior cerebral arteries patent bilaterally. Basilar widely patent to its distal aspect without stenosis. Superior cerebral arteries patent bilaterally. Both of the posterior cerebral arteries primarily supplied via the basilar and are widely patent to their distal aspects. Venous sinuses: Grossly patent allowing for timing of the contrast bolus. Anatomic variants: None significant.  No intracranial aneurysm. Delayed phase: Not performed. Review of the MIP images confirms the above findings IMPRESSION: 1. Negative CTA for emergent large vessel occlusion. 2. Prior left carotid endarterectomy without residual or recurrent CCA/ICA stenosis. Persistent and/or recurrent 50% stenosis at the origin of the left ECA. 3. Short-segment severe at least 80% stenosis at the origin of the dominant left  vertebral artery. Otherwise wide patency of the vertebrobasilar system. 4. Mild atherosclerotic change about  the carotid siphons without hemodynamically significant or correctable stenosis. These results were communicated to Dr. Lorraine Lax at [place] 7/8/2020by text page via the [facility] messaging system. Electronically Signed   By: Jeannine Boga M.D.   On: 05/13/2019 20:38   Ct Code Stroke Cta Neck W/wo Contrast  Result Date: 05/13/2019 CLINICAL DATA:  Initial evaluation for acute left-sided facial droop, slurred speech. EXAM: CT ANGIOGRAPHY HEAD AND NECK TECHNIQUE: Multidetector CT imaging of the head and neck was performed using the standard protocol during bolus administration of intravenous contrast. Multiplanar CT image reconstructions and MIPs were obtained to evaluate the vascular anatomy. Carotid stenosis measurements (when applicable) are obtained utilizing NASCET criteria, using the distal internal carotid diameter as the denominator. CONTRAST:  147mL OMNIPAQUE IOHEXOL 350 MG/ML SOLN COMPARISON:  Prior noncontrast head CT from earlier the same day. FINDINGS: CTA NECK FINDINGS Aortic arch: Visualized aortic arch of normal caliber with normal branch pattern. Moderate atherosclerotic change noted about the aortic arch and origin of the great vessels. Short-segment stenosis of approximately 70% noted at the proximal left subclavian artery, prior to the takeoff of the left vertebral artery (series 7, image 306). Visualized subclavian arteries otherwise widely patent. Right carotid system: Right common carotid artery tortuous proximally but widely patent to the bifurcation without stenosis. Mild atheromatous irregularity about the right bifurcation without hemodynamically significant stenosis. Right ICA widely patent distally to the skull base without stenosis, dissection, or occlusion. Left carotid system: Mild approximate 30% narrowing at the origin of the left common carotid artery. Left CCA otherwise widely patent to the bifurcation without stenosis. Postoperative changes from prior endarterectomy about the  left bifurcation. No residual hemodynamically significant stenosis at the origin of the left ICA. Approximate 50% stenosis noted at the origin of the left ECA. Left ICA widely patent distally to the skull base without stenosis, dissection, or occlusion. Vertebral arteries: Both of the vertebral arteries arise from the subclavian arteries. Focal plaque at the origin of the left vertebral artery with severe at least 80% stenosis. Left vertebral artery slightly dominant. Mild scattered atheromatous irregularity within the V2 segments bilaterally with mild to moderate stenoses. Vertebral arteries otherwise widely patent within the neck without acute abnormality. Skeleton: No acute osseous finding. No discrete lytic or blastic osseous lesions. Moderate multilevel cervical spondylolysis at C3-4 through C6-7. Degenerative changes noted about the TMJs bilaterally, right worse than left. Other neck: Approximate 2 cm cystic lesion within the subcutaneous fat of the right posterior neck most likely reflects a sebaceous cyst. Soft tissues of the neck demonstrate no acute finding. Upper chest: Unremarkable. Review of the MIP images confirms the above findings CTA HEAD FINDINGS Anterior circulation: Petrous segments widely patent. Mild scattered atheromatous plaque within the cavernous/supraclinoid ICAs without hemodynamically significant stenosis. ICA termini well perfused. A1 segments patent bilaterally. Right A1 hypoplastic, accounting for the slightly diminutive right ICA is compared to the left. Normal anterior communicating artery. Anterior cerebral arteries patent to their distal aspects without stenosis or occlusion. No M1 stenosis or occlusion. Normal MCA bifurcations. Distal MCA branches well perfused and symmetric. Posterior circulation: Vertebral arteries patent to the vertebrobasilar junction without stenosis. Left vertebral artery dominant. Posteroinferior cerebral arteries patent bilaterally. Basilar widely patent  to its distal aspect without stenosis. Superior cerebral arteries patent bilaterally. Both of the posterior cerebral arteries primarily supplied via the basilar and are widely patent to their distal aspects. Venous sinuses: Grossly patent  allowing for timing of the contrast bolus. Anatomic variants: None significant.  No intracranial aneurysm. Delayed phase: Not performed. Review of the MIP images confirms the above findings IMPRESSION: 1. Negative CTA for emergent large vessel occlusion. 2. Prior left carotid endarterectomy without residual or recurrent CCA/ICA stenosis. Persistent and/or recurrent 50% stenosis at the origin of the left ECA. 3. Short-segment severe at least 80% stenosis at the origin of the dominant left vertebral artery. Otherwise wide patency of the vertebrobasilar system. 4. Mild atherosclerotic change about the carotid siphons without hemodynamically significant or correctable stenosis. These results were communicated to Dr. Lorraine Lax at [place] 7/8/2020by text page via the [facility] messaging system. Electronically Signed   By: Jeannine Boga M.D.   On: 05/13/2019 20:38   Mr Brain Wo Contrast  Result Date: 05/13/2019 CLINICAL DATA:  Initial evaluation for acute stroke, slurred speech. EXAM: MRI HEAD WITHOUT CONTRAST TECHNIQUE: Multiplanar, multiecho pulse sequences of the brain and surrounding structures were obtained without intravenous contrast. COMPARISON:  Prior CT and CTA from earlier same day. FINDINGS: Brain: Examination technically limited as the patient was unable to tolerate the full length of the exam. Diffusion-weighted imaging, axial T2, FLAIR, and gradient echo sequences, as well as coronal T2 sequences only were performed. Additionally, images are markedly degraded by motion artifact. Generalized age-related cerebral atrophy with moderate chronic microvascular ischemic disease. No abnormal foci of restricted diffusion to suggest acute or subacute ischemia on this motion  degraded exam. Gray-white matter differentiation maintained. No areas of remote cortical infarction. No evidence for acute intracranial hemorrhage. Single punctate chronic microhemorrhage noted at the anterior left frontal region, of doubtful significance in isolation. No mass lesion, midline shift or mass effect. No hydrocephalus. No extra-axial fluid collection. Vascular: Major intracranial vascular flow voids are maintained. Skull and upper cervical spine: Grossly negative on this limited exam. Sinuses/Orbits: Patient status post bilateral ocular lens replacement. Paranasal sinuses are largely clear. No mastoid effusion. Other: None. IMPRESSION: 1. Technically limited exam due to patient's inability to tolerate the full length of the study as well as motion artifact. 2. No acute intracranial infarct or other abnormality. 3. Age-related cerebral atrophy with moderate chronic microvascular ischemic disease. Electronically Signed   By: Jeannine Boga M.D.   On: 05/13/2019 21:07   Dg Chest Portable 1 View  Result Date: 05/13/2019 CLINICAL DATA:  Confused and unresponsive. EXAM: PORTABLE CHEST 1 VIEW COMPARISON:  None. FINDINGS: Heart size is normal. Aortic atherosclerosis. Right lung is clear. There is volume loss and infiltrate in the left lower lobe suspicious for pneumonia. No visible effusion. No acute bone finding. IMPRESSION: Left lower lobe volume loss and infiltrate suspicious for pneumonia. Electronically Signed   By: Nelson Chimes M.D.   On: 05/13/2019 21:54   Dg Hip Unilat With Pelvis 2-3 Views Left  Result Date: 05/16/2019 CLINICAL DATA:  Fall yesterday.  Pain. EXAM: DG HIP (WITH OR WITHOUT PELVIS) 2-3V LEFT COMPARISON:  None. FINDINGS: Vascular calcifications in the femoral arteries bilaterally. No acute fractures or dislocations. Mild degenerative changes in the hips. IMPRESSION: No fractures. Electronically Signed   By: Dorise Bullion III M.D   On: 05/16/2019 13:51   Ct Head Code Stroke  Wo Contrast  Result Date: 05/13/2019 CLINICAL DATA:  Code stroke. Radiologist Dr. Maeola Harman of [facility] cannot cough and back item distension stroke arm yet is yet apparent again next month by EXAM: CT HEAD WITHOUT CONTRAST TECHNIQUE: Contiguous axial images were obtained from the base of the skull through the vertex  without intravenous contrast. COMPARISON:  None available. FINDINGS: Brain: Generalized age-related cerebral atrophy with moderate chronic microvascular ischemic disease. No acute intracranial hemorrhage. No acute large vessel territory infarct. No mass lesion, midline shift or mass effect. No hydrocephalus. No extra-axial fluid collection. Vascular: No hyperdense vessel. Calcified atherosclerosis at the skull base. Skull: Scalp soft tissues and calvarium within normal limits. Sinuses/Orbits: Globes and orbital soft tissues within normal limits. Mild scattered mucosal thickening within the ethmoidal air cells and maxillary sinuses. Paranasal sinuses are otherwise clear. No mastoid effusion. Other: None. ASPECTS [facility] Stroke Program Early CT Score) - Ganglionic level infarction (caudate, lentiform nuclei, internal capsule, insula, M1-M3 cortex): 7 - Supraganglionic infarction (M4-M6 cortex): 3 Total score (0-10 with 10 being normal): 10 IMPRESSION: 1. No acute intracranial infarct or other abnormality identified. 2. ASPECTS is 10. 3. Age-related cerebral atrophy with moderate chronic microvascular ischemic disease. These results were communicated to Dr. Lorraine Lax at 7:52 [facility] 7/8/2020by text page via the [facility] messaging system. Electronically Signed   By: Jeannine Boga M.D.   On: 05/13/2019 19:54      Subjective: No issues overnight  Discharge Exam: Vitals:   05/16/19 1426 05/16/19 1427  BP: (!) 222/67 (!) 161/92  Pulse:    Resp:    Temp:    SpO2:     Vitals:   05/16/19 0755 05/16/19 1140 05/16/19 1426 05/16/19 1427  BP: (!) 159/67 (!) 198/93 (!) 222/67 (!) 161/92  Pulse: (!) 58 (!)  58    Resp: 16 16    Temp: 97.7 F (36.5 C) 98.2 F (36.8 C)    TempSrc: Oral Oral    SpO2: 97% 97%    Weight:      Height:        General: Pt is alert, awake, not in acute distress Cardiovascular: RRR, S1/S2 +, no rubs, no gallops Respiratory: CTA bilaterally, no wheezing, no rhonchi Abdominal: Soft, NT, ND, bowel sounds + Extremities: no edema, no cyanosis    The results of significant diagnostics from this hospitalization (including imaging, microbiology, ancillary and laboratory) are listed below for reference.     Microbiology: Recent Results (from the past 240 hour(s))  SARS Coronavirus 2 (CEPHEID- Performed in [place] hospital lab), Hosp Order     Status: None   Collection Time: 05/13/19 11:26 PM   Specimen: Nasopharyngeal Swab  Result Value Ref Range Status   SARS Coronavirus 2 NEGATIVE NEGATIVE Final    Comment: (NOTE) If result is NEGATIVE SARS-CoV-2 target nucleic acids are NOT DETECTED. The SARS-CoV-2 RNA is generally detectable in upper and lower  respiratory specimens during the acute phase of infection. The lowest  concentration of SARS-CoV-2 viral copies this assay can detect is 250  copies / mL. A negative result does not preclude SARS-CoV-2 infection  and should not be used as the sole basis for treatment or other  patient management decisions.  A negative result may occur with  improper specimen collection / handling, submission of specimen other  than nasopharyngeal swab, presence of viral mutation(s) within the  areas targeted by this assay, and inadequate number of viral copies  (<250 copies / mL). A negative result must be combined with clinical  observations, patient history, and epidemiological information. If result is POSITIVE SARS-CoV-2 target nucleic acids are DETECTED. The SARS-CoV-2 RNA is generally detectable in upper and lower  respiratory specimens dur ing the acute phase of infection.  Positive  results are indicative of active  infection with SARS-CoV-2.  Clinical  correlation  with patient history and other diagnostic information is  necessary to determine patient infection status.  Positive results do  not rule out bacterial infection or co-infection with other viruses. If result is PRESUMPTIVE POSTIVE SARS-CoV-2 nucleic acids MAY BE PRESENT.   A presumptive positive result was obtained on the submitted specimen  and confirmed on repeat testing.  While 2019 novel coronavirus  (SARS-CoV-2) nucleic acids may be present in the submitted sample  additional confirmatory testing may be necessary for epidemiological  and / or clinical management purposes  to differentiate between  SARS-CoV-2 and other Sarbecovirus currently known to infect humans.  If clinically indicated additional testing with an alternate test  methodology [id number]) is advised. The SARS-CoV-2 RNA is generally  detectable in upper and lower respiratory sp ecimens during the acute  phase of infection. The expected result is Negative. Fact Sheet for Patients:  StrictlyIdeas.no Fact Sheet for Healthcare Providers: BankingDealers.co.za This test is not yet approved or cleared by the [place] FDA and has been authorized for detection and/or diagnosis of SARS-CoV-2 by FDA under an Emergency Use Authorization (EUA).  This EUA will remain in effect (meaning this test can be used) for the duration of the COVID-19 declaration under Section 564(b)(1) of the Act, 21 U.S.C. section 360bbb-3(b)(1), unless the authorization is terminated or revoked sooner. Performed at [facility], [place] [address] [place], [place] [address]   Culture, blood (routine x 2)     Status: None (Preliminary result)   Collection Time: 05/14/19 10:49 AM   Specimen: BLOOD  Result Value Ref Range Status   Specimen Description BLOOD LEFT ANTECUBITAL  Final   Special Requests   Final    BOTTLES DRAWN AEROBIC AND ANAEROBIC Blood  Culture results may not be optimal due to an excessive volume of blood received in culture bottles   Culture   Final    NO GROWTH 1 DAY Performed at [facility], [place] [address] [place], [place] [address]    Report Status PENDING  Incomplete  Culture, blood (routine x 2)     Status: None (Preliminary result)   Collection Time: 05/14/19 10:50 AM   Specimen: BLOOD  Result Value Ref Range Status   Specimen Description BLOOD LEFT ANTECUBITAL  Final   Special Requests   Final    BOTTLES DRAWN AEROBIC ONLY Blood Culture adequate volume   Culture   Final    NO GROWTH 1 DAY Performed at [facility], [place] [address] [place], [place] [address]    Report Status PENDING  Incomplete     Labs: BNP (last 3 results) No results for input(s): BNP in the last 8760 hours. Basic Metabolic Panel: Recent Labs  Lab 05/13/19 1933 05/13/19 1940 05/13/19 2118 05/14/19 0720 05/15/19 1200 05/16/19 1004  NA 141 141  --  143 144 142  K 3.9 3.9  --  3.5 3.6 3.4*  CL 107 106  --  109 112* 112*  CO2 23  --   --  24 25 22   GLUCOSE 130* 122*  --  111* 101* 113*  BUN 22 22  --  21 18 20   CREATININE 1.87* 1.60*  --  1.74* 1.64* 1.62*  CALCIUM 9.6  --   --  9.1 8.6* 8.7*  MG  --   --  1.5*  --   --   --    Liver Function Tests: Recent Labs  Lab 05/13/19 1933 05/14/19 0720  AST 16 16  ALT  14 12  ALKPHOS 37* 34*  BILITOT 0.9 0.7  PROT 6.3* 5.8*  ALBUMIN 3.7 3.4*   No results for input(s): LIPASE, AMYLASE in the last 168 hours. Recent Labs  Lab 05/13/19 2326  AMMONIA 38*   CBC: Recent Labs  Lab 05/13/19 1933 05/13/19 1940 05/14/19 0720  WBC 6.8  --  7.8  NEUTROABS 5.8  --   --   HGB 11.3* 11.2* 10.5*  HCT 33.9* 33.0* 30.7*  MCV 92.6  --  90.3  PLT 224  --  231   Cardiac Enzymes: Recent Labs  Lab 05/14/19 0203  CKTOTAL 79   BNP: Invalid input(s): POCBNP CBG: No results for input(s): GLUCAP in the last 168 hours. D-Dimer No results for input(s): DDIMER in the  last 72 hours. Hgb A1c No results for input(s): HGBA1C in the last 72 hours. Lipid Profile No results for input(s): CHOL, HDL, LDLCALC, TRIG, CHOLHDL, LDLDIRECT in the last 72 hours. Thyroid function studies Recent Labs    05/13/19 2118  TSH 2.445   Anemia work up No results for input(s): VITAMINB12, FOLATE, FERRITIN, TIBC, IRON, RETICCTPCT in the last 72 hours. Urinalysis    Component Value Date/Time   COLORURINE YELLOW 05/13/2019 2152   APPEARANCEUR CLEAR 05/13/2019 2152   APPEARANCEUR Clear 05/27/2015 1351   LABSPEC 1.027 05/13/2019 2152   PHURINE 5.0 05/13/2019 2152   GLUCOSEU NEGATIVE 05/13/2019 2152   HGBUR MODERATE (A) 05/13/2019 2152   BILIRUBINUR NEGATIVE 05/13/2019 2152   BILIRUBINUR Negative 05/27/2015 1351   KETONESUR 5 (A) 05/13/2019 2152   PROTEINUR 100 (A) 05/13/2019 2152   NITRITE NEGATIVE 05/13/2019 2152   LEUKOCYTESUR NEGATIVE 05/13/2019 2152   Sepsis Labs Invalid input(s): PROCALCITONIN,  WBC,  LACTICIDVEN Microbiology Recent Results (from the past 240 hour(s))  SARS Coronavirus 2 (CEPHEID- Performed in [facility] lab), Hosp Order     Status: None   Collection Time: 05/13/19 11:26 PM   Specimen: Nasopharyngeal Swab  Result Value Ref Range Status   SARS Coronavirus 2 NEGATIVE NEGATIVE Final    Comment: (NOTE) If result is NEGATIVE SARS-CoV-2 target nucleic acids are NOT DETECTED. The SARS-CoV-2 RNA is generally detectable in upper and lower  respiratory specimens during the acute phase of infection. The lowest  concentration of SARS-CoV-2 viral copies this assay can detect is 250  copies / mL. A negative result does not preclude SARS-CoV-2 infection  and should not be used as the sole basis for treatment or other  patient management decisions.  A negative result may occur with  improper specimen collection / handling, submission of specimen other  than nasopharyngeal swab, presence of viral mutation(s) within the  areas targeted by this  assay, and inadequate number of viral copies  (<250 copies / mL). A negative result must be combined with clinical  observations, patient history, and epidemiological information. If result is POSITIVE SARS-CoV-2 target nucleic acids are DETECTED. The SARS-CoV-2 RNA is generally detectable in upper and lower  respiratory specimens dur ing the acute phase of infection.  Positive  results are indicative of active infection with SARS-CoV-2.  Clinical  correlation with patient history and other diagnostic information is  necessary to determine patient infection status.  Positive results do  not rule out bacterial infection or co-infection with other viruses. If result is PRESUMPTIVE POSTIVE SARS-CoV-2 nucleic acids MAY BE PRESENT.   A presumptive positive result was obtained on the submitted specimen  and confirmed on repeat testing.  While 2019 novel coronavirus  (SARS-CoV-2) nucleic  acids may be present in the submitted sample  additional confirmatory testing may be necessary for epidemiological  and / or clinical management purposes  to differentiate between  SARS-CoV-2 and other Sarbecovirus currently known to infect humans.  If clinically indicated additional testing with an alternate test  methodology [phone]) is advised. The SARS-CoV-2 RNA is generally  detectable in upper and lower respiratory sp ecimens during the acute  phase of infection. The expected result is Negative. Fact Sheet for Patients:  StrictlyIdeas.no Fact Sheet for Healthcare Providers: BankingDealers.co.za This test is not yet approved or cleared by the [place] FDA and has been authorized for detection and/or diagnosis of SARS-CoV-2 by FDA under an Emergency Use Authorization (EUA).  This EUA will remain in effect (meaning this test can be used) for the duration of the COVID-19 declaration under Section 564(b)(1) of the Act, 21 U.S.C. section 360bbb-3(b)(1),  unless the authorization is terminated or revoked sooner. Performed at [facility], [place] [address] [place], [place] [address]   Culture, blood (routine x 2)     Status: None (Preliminary result)   Collection Time: 05/14/19 10:49 AM   Specimen: BLOOD  Result Value Ref Range Status   Specimen Description BLOOD LEFT ANTECUBITAL  Final   Special Requests   Final    BOTTLES DRAWN AEROBIC AND ANAEROBIC Blood Culture results may not be optimal due to an excessive volume of blood received in culture bottles   Culture   Final    NO GROWTH 1 DAY Performed at [facility], [place] [address] [place], [place] [address]    Report Status PENDING  Incomplete  Culture, blood (routine x 2)     Status: None (Preliminary result)   Collection Time: 05/14/19 10:50 AM   Specimen: BLOOD  Result Value Ref Range Status   Specimen Description BLOOD LEFT ANTECUBITAL  Final   Special Requests   Final    BOTTLES DRAWN AEROBIC ONLY Blood Culture adequate volume   Culture   Final    NO GROWTH 1 DAY Performed at [facility], [place] [address] [place],  [address]    Report Status PENDING  Incomplete     Time coordinating discharge: 35 minutes  SIGNED:   Cordelia Poche, MD [facility] 05/16/2019, 2:45 PM

## 2019-05-16 NOTE — Evaluation (Signed)
Occupational Therapy Evaluation Patient Details Name: Charles Sims MRN: [id number] DOB: [birth year] Today's Date: 05/16/2019    History of Present Illness 85 yo male with onset of general weakness with L facial droop and dysarthria , AMS was admitted and noted PNA.  Had clear UA but also cleared for Covid.  MRI did not show acute stroke changes    PMHx:  atherosclerosis, enlarged prostate, nearby nodes to prostate, diverticulosis,    Clinical Impression   Pt admitted with above. He demonstrates the below listed deficits and will benefit from continued OT to maximize safety and independence with BADLs.  Pt presents to OT with generalized weakness, impaired balance, pain Lt hip as well as significant cognitive deficits.  He scored 21/28 on the Short Blessed Test:   He is not oriented to month or time; he made 4 errors when attempting to recite months backwards, and was unable to recall name/address provided after ~3 mins.   He currently requires mod A for LB ADLs and functional mobility.  He reports he lives with is wife and that he was fully independent with the exception of driving.  Anticipate he will require SNF level rehab at discharge.       Follow Up Recommendations  SNF;Supervision/Assistance - 24 hour    Equipment Recommendations  None recommended by OT    Recommendations for Other Services       Precautions / Restrictions Precautions Precautions: Fall Restrictions Weight Bearing Restrictions: No      Mobility Bed Mobility Overal bed mobility: Needs Assistance Bed Mobility: Supine to Sit;Sit to Supine     Supine to sit: Min guard;HOB elevated Sit to supine: Min guard   General bed mobility comments: Pt with reliance on bedrails and requires increased time   Transfers Overall transfer level: Needs assistance Equipment used: Rolling walker (2 wheeled);1 person hand held assist Transfers: Sit to/from Stand Sit to Stand: Min assist         General transfer comment:  assist to power up.  He demonstrates difficulty shifting weight due to increased pain     Balance Overall balance assessment: Needs assistance Sitting-balance support: Feet supported Sitting balance-Leahy Scale: Fair     Standing balance support: Bilateral upper extremity supported;During functional activity Standing balance-Leahy Scale: Poor Standing balance comment: relian on UE support                            ADL either performed or assessed with clinical judgement   ADL Overall ADL's : Needs assistance/impaired Eating/Feeding: Set up;Sitting   Grooming: Wash/dry hands;Wash/dry face;Oral care;Set up;Sitting   Upper Body Bathing: Set up;Supervision/ safety;Sitting   Lower Body Bathing: Moderate assistance;Sit to/from stand   Upper Body Dressing : Minimal assistance;Sitting   Lower Body Dressing: Moderate assistance;Sit to/from stand Lower Body Dressing Details (indicate cue type and reason): able to don/doff socks  Toilet Transfer: Moderate assistance;Stand-pivot;BSC;RW   Toileting- Clothing Manipulation and Hygiene: Maximal assistance;Sit to/from stand       Functional mobility during ADLs: Moderate assistance;Rolling walker General ADL Comments: Pt limited by Rt hip pain, as well as impaired balance     Vision Baseline Vision/History: Wears glasses Wears Glasses: At all times Patient Visual Report: No change from baseline Vision Assessment?: No apparent visual deficits     Perception     Praxis Praxis Praxis tested?: Within functional limits    Pertinent Vitals/Pain Pain Assessment: Faces Faces Pain Scale: Hurts even more Pain Location:  Lt hip  Pain Intervention(s): Monitored during session;Limited activity within patient's tolerance;Repositioned     Hand Dominance Right   Extremity/Trunk Assessment Upper Extremity Assessment Upper Extremity Assessment: Generalized weakness   Lower Extremity Assessment Lower Extremity Assessment: Defer  to PT evaluation   Cervical / Trunk Assessment Cervical / Trunk Assessment: Kyphotic   Communication Communication Communication: No difficulties   Cognition Arousal/Alertness: Awake/alert Behavior During Therapy: Flat affect;WFL for tasks assessed/performed Overall Cognitive Status: Impaired/Different from baseline Area of Impairment: Orientation;Attention;Memory;Following commands;Awareness;Problem solving                 Orientation Level: Disoriented to;Time;Situation Current Attention Level: Sustained Memory: Decreased short-term memory Following Commands: Follows one step commands consistently     Problem Solving: Slow processing;Difficulty sequencing;Requires verbal cues;Requires tactile cues General Comments: The short Blessed Test was administered with pt scoring 21/28 - he is no oriented to month, he made 4 errors when attempting to recite the months of the year backwards, and was unable to recall any portion of name and address provided.       General Comments       Exercises     Shoulder Instructions      Home Living Family/patient expects to be discharged to:: Private residence Living Arrangements: Spouse/significant other                               Additional Comments: Pt unable to provide accurate info       Prior Functioning/Environment Level of Independence: Independent(unsure)        Comments: Pt indicates he was independent with ADLs and did not use AD. He reports he stopped driving ~1 mos ago as he no longer trusted himself to do so safely         OT Problem List: Decreased activity tolerance;Impaired balance (sitting and/or standing);Decreased cognition;Decreased safety awareness;Decreased knowledge of use of DME or AE;Pain      OT Treatment/Interventions: Self-care/ADL training;Neuromuscular education;DME and/or AE instruction;Therapeutic activities;Cognitive remediation/compensation;Patient/family education;Balance  training    OT Goals(Current goals can be found in the care plan section) Acute Rehab OT Goals Patient Stated Goal: to figure out what happened to me  OT Goal Formulation: With patient Time For Goal Achievement: 05/30/19 Potential to Achieve Goals: Good ADL Goals Pt Will Perform Grooming: with min assist;standing Pt Will Perform Lower Body Bathing: with min assist;sit to/from stand Pt Will Perform Lower Body Dressing: with min assist;sit to/from stand Pt Will Transfer to Toilet: with min assist;ambulating;regular height toilet;bedside commode;grab bars Pt Will Perform Toileting - Clothing Manipulation and hygiene: with min assist;sit to/from stand  OT Frequency: Min 2X/week   Barriers to D/C: Decreased caregiver support  per pt, wife unable to provide current level of assist        Co-evaluation              AM-PAC OT "6 Clicks" Daily Activity     Outcome Measure Help from another person eating meals?: None Help from another person taking care of personal grooming?: A Lot Help from another person toileting, which includes using toliet, bedpan, or urinal?: A Lot Help from another person bathing (including washing, rinsing, drying)?: A Lot Help from another person to put on and taking off regular upper body clothing?: A Little Help from another person to put on and taking off regular lower body clothing?: A Lot 6 Click Score: 15   End of Session Equipment Utilized During Treatment:  Gait belt;Rolling walker Nurse Communication: Mobility status  Activity Tolerance: Patient limited by pain Patient left: in bed;with call bell/phone within reach;with bed alarm set  OT Visit Diagnosis: Unsteadiness on feet (R26.81);Pain Pain - Right/Left: Left Pain - part of body: Hip                Time: 1005-1033 OT Time Calculation (min): 28 min Charges:  OT General Charges $OT Visit: 1 Visit OT Evaluation $OT Eval Moderate Complexity: 1 Mod OT Treatments $Self Care/Home Management :  8-22 mins  Lucille Passy, OTR/L Acute Rehabilitation Services Pager [phone] Office [phone]   Lucille Passy M 05/16/2019, 10:54 AM

## 2019-05-19 ENCOUNTER — Other Ambulatory Visit: Payer: Self-pay | Admitting: *Deleted

## 2019-05-19 LAB — CULTURE, BLOOD (ROUTINE X 2)
Culture: NO GROWTH
Culture: NO GROWTH
Special Requests: ADEQUATE

## 2019-05-19 NOTE — Patient Outreach (Signed)
[facility]) Care Management  05/19/2019  JUSHUA WALTMAN 1930-06-26 [id number]   Subjective: Telephone call to patient's home number, spoke with male answering the phone, states she is having a hard time hearing caller, caller increased phone volume, caller increased voice volume, male handed phone to [name], states he is having hard time hearing caller, he states there is a problem with the phone, and caller advised will call back to attempt to get better phone connection.  Telephone call to patient's home number times 2 and line busy.  Telephone call to patient's home  number, no answer, left HIPAA compliant voicemail message, and requested call back.    Objective: Per KPN (Knowledge Performance Now, point of care tool) and chart review, patient hospitalized 05/13/2019 -05/16/2019 for Acute encephalopathy, Community acquired pneumonia of left lower lobe of lung.  Patient also has a history of hypertension, hyperlipidemia, chronic kidney disease stage 3, CAD, carotid stenosis status post left CAROTID ENDARTERECTOMY on 04/13/2015, Benign prostatic hypertrophy with lower urinary tract symptoms, Bladder calculi, Adenoma of right adrenal gland, and Prostatitis.      Assessment: Received [employer] Medicare Transition of care referral on 05/19/2019.   Transition of care follow up pending patient contact.      Plan: RNCM will send unsuccessful outreach letter, [facility] pamphlet, will call patient for 2nd telephone outreach attempt within 4 business days, transition of care follow up, and proceed with case closure, within 10 business days if no return call.      Willey Due H. Annia Friendly, BSN, [place] Management [facility] Telephonic CM Phone: [phone] Fax: [phone]

## 2019-05-20 ENCOUNTER — Other Ambulatory Visit: Payer: Self-pay | Admitting: *Deleted

## 2019-05-20 DIAGNOSIS — M1990 Unspecified osteoarthritis, unspecified site: Secondary | ICD-10-CM | POA: Diagnosis not present

## 2019-05-20 DIAGNOSIS — J181 Lobar pneumonia, unspecified organism: Secondary | ICD-10-CM | POA: Diagnosis not present

## 2019-05-20 DIAGNOSIS — K219 Gastro-esophageal reflux disease without esophagitis: Secondary | ICD-10-CM | POA: Diagnosis not present

## 2019-05-20 DIAGNOSIS — I2510 Atherosclerotic heart disease of native coronary artery without angina pectoris: Secondary | ICD-10-CM | POA: Diagnosis not present

## 2019-05-20 DIAGNOSIS — I129 Hypertensive chronic kidney disease with stage 1 through stage 4 chronic kidney disease, or unspecified chronic kidney disease: Secondary | ICD-10-CM | POA: Diagnosis not present

## 2019-05-20 DIAGNOSIS — D3501 Benign neoplasm of right adrenal gland: Secondary | ICD-10-CM | POA: Diagnosis not present

## 2019-05-20 DIAGNOSIS — N183 Chronic kidney disease, stage 3 (moderate): Secondary | ICD-10-CM | POA: Diagnosis not present

## 2019-05-20 DIAGNOSIS — G9340 Encephalopathy, unspecified: Secondary | ICD-10-CM | POA: Diagnosis not present

## 2019-05-20 DIAGNOSIS — K5790 Diverticulosis of intestine, part unspecified, without perforation or abscess without bleeding: Secondary | ICD-10-CM | POA: Diagnosis not present

## 2019-05-20 NOTE — Patient Outreach (Signed)
[facility]) Care Management  05/20/2019  Charles Sims [birth year] [id number]   Subjective: Received voicemail from Charles Sims, states he is returning call, and requested call back. Telephone call to patient's home number, no answer, left HIPAA compliant voicemail message, and requested call back.    Objective: Per KPN (Knowledge Performance Now, point of care tool) and chart review, patient hospitalized 05/13/2019 -05/16/2019 for Acute encephalopathy, Community acquired pneumonia of left lower lobe of lung.  Patient also has a history of hypertension, hyperlipidemia, chronic kidney disease stage 3, CAD, carotid stenosis status post left CAROTID ENDARTERECTOMY on 04/13/2015, Benign prostatic hypertrophy with lower urinary tract symptoms, Bladder calculi, Adenoma of right adrenal gland, and Prostatitis.      Assessment: Received [employer] Medicare Transition of care referral on 05/19/2019.   Transition of care follow up pending patient contact.      Plan: RNCM has sent unsuccessful outreach letter, [facility] pamphlet, will call patient for 3rd telephone outreach attempt within 4 business days, transition of care follow up, and proceed with case closure, within 10 business days if no return call.     Haylin Camilli H. Annia Friendly, BSN, [place] Management [facility] Telephonic CM Phone: [phone] Fax: [phone]

## 2019-05-21 ENCOUNTER — Encounter: Payer: Self-pay | Admitting: *Deleted

## 2019-05-21 ENCOUNTER — Other Ambulatory Visit: Payer: Self-pay | Admitting: *Deleted

## 2019-05-21 NOTE — Patient Outreach (Addendum)
[facility]) Care Management  05/21/2019  Charles Sims 1930/01/25 [id number]   Subjective: Received voicemail from Charles Sims, states she is returning call for Charles Sims, and requested call back. Telephone call to patient's home number, spoke with male answering phone, spoke with patient, and HIPAA verified.  Patient gave this RNCM verbal consent to speak with wife Charles Sims) regarding his healthcare needs as needed.  Patient requested RNCM complete assessment with his wife and he planned to return to sleep.  Spoke with patient's wife Charles Sims), discussed [place] Medicare Transition of care follow up, wife voiced understanding, and is in agreement to follow up on patient's behalf.  Wife states patient is doing much better, has gone back to sleep, has been receiving therapy through home health agency, and therapy is going well.   States patient has a hospital follow up appointment with primary MD on 05/22/2019.  Wife states patient is able to manage self care and has assistance as needed.  States she nor patient drive and their son provides transportation as needed.  Wife voices understanding of patient's medical diagnosis and treatment plan.  Medication review completed with patient's wife and wife states she will obtain updated medication list from MD office on 05/22/2019.   Wife is aware of how to access [employer] benefits and community resources, on patient's behalf as needed.  Discussed Advanced Directives, states she has their documents in their safe, has not given a copy to providers, advised MD's office may have access to [employer] to scan Advanced Directives documents into the system, once in the system, can be viewed by all Epic users,  wife voices understanding, and  declined to access benefit at this time.  Wife states patient does not have any education material, transition of care, care coordination, disease management, disease monitoring, transportation,  community resource, or pharmacy needs at this time.  States she is very appreciative of the follow up, has reviewed above information with multiple people since patient's discharge, is aware it has to be done, is okay with providing information, had to go to multiple places within her home to obtain information, and is in agreement to receive [place] Management information.     Objective:Per KPN (Knowledge Performance Now, point of care tool) and chart review,patient hospitalized 05/13/2019 -05/16/2019 forAcute encephalopathy,Community acquired pneumonia of left lower lobe of lung. Patient also has a history of hypertension, hyperlipidemia, chronic kidney disease stage 3, CAD, carotid stenosis status post left CAROTID ENDARTERECTOMYon 04/13/2015,Benign prostatic hypertrophy with lower urinary tract symptoms,Bladder calculi,Adenoma of right adrenal gland, andProstatitis.     Assessment: Received [employer] Medicare Transition of care referral on 05/19/2019.Transition of care follow up completed, no care management needs, and will proceed with case closure.      Plan:RNCM will send patient successful outreach letter, [facility] pamphlet, and magnet. RNCM will complete case closure due to follow up completed / no care management needs.  RNCM will send MD case closure letter.      Ayodele Hartsock H. Annia Friendly, BSN, [place] Management [facility] Telephonic CM Phone: [phone] Fax: [phone]

## 2019-05-22 DIAGNOSIS — I10 Essential (primary) hypertension: Secondary | ICD-10-CM | POA: Diagnosis not present

## 2019-05-22 DIAGNOSIS — D509 Iron deficiency anemia, unspecified: Secondary | ICD-10-CM | POA: Diagnosis not present

## 2019-05-22 DIAGNOSIS — R5382 Chronic fatigue, unspecified: Secondary | ICD-10-CM | POA: Diagnosis not present

## 2019-05-22 DIAGNOSIS — R55 Syncope and collapse: Secondary | ICD-10-CM | POA: Diagnosis not present

## 2019-05-22 DIAGNOSIS — D519 Vitamin B12 deficiency anemia, unspecified: Secondary | ICD-10-CM | POA: Diagnosis not present

## 2019-05-22 DIAGNOSIS — N183 Chronic kidney disease, stage 3 (moderate): Secondary | ICD-10-CM | POA: Diagnosis not present

## 2019-05-22 DIAGNOSIS — R310 Gross hematuria: Secondary | ICD-10-CM | POA: Diagnosis not present

## 2019-05-22 DIAGNOSIS — R5381 Other malaise: Secondary | ICD-10-CM | POA: Diagnosis not present

## 2019-05-22 DIAGNOSIS — R531 Weakness: Secondary | ICD-10-CM | POA: Diagnosis not present

## 2019-05-25 DIAGNOSIS — J181 Lobar pneumonia, unspecified organism: Secondary | ICD-10-CM | POA: Diagnosis not present

## 2019-05-25 DIAGNOSIS — D3501 Benign neoplasm of right adrenal gland: Secondary | ICD-10-CM | POA: Diagnosis not present

## 2019-05-25 DIAGNOSIS — K5790 Diverticulosis of intestine, part unspecified, without perforation or abscess without bleeding: Secondary | ICD-10-CM | POA: Diagnosis not present

## 2019-05-25 DIAGNOSIS — K219 Gastro-esophageal reflux disease without esophagitis: Secondary | ICD-10-CM | POA: Diagnosis not present

## 2019-05-25 DIAGNOSIS — G9340 Encephalopathy, unspecified: Secondary | ICD-10-CM | POA: Diagnosis not present

## 2019-05-25 DIAGNOSIS — I2510 Atherosclerotic heart disease of native coronary artery without angina pectoris: Secondary | ICD-10-CM | POA: Diagnosis not present

## 2019-05-25 DIAGNOSIS — N183 Chronic kidney disease, stage 3 (moderate): Secondary | ICD-10-CM | POA: Diagnosis not present

## 2019-05-25 DIAGNOSIS — M1990 Unspecified osteoarthritis, unspecified site: Secondary | ICD-10-CM | POA: Diagnosis not present

## 2019-05-25 DIAGNOSIS — I129 Hypertensive chronic kidney disease with stage 1 through stage 4 chronic kidney disease, or unspecified chronic kidney disease: Secondary | ICD-10-CM | POA: Diagnosis not present

## 2019-05-27 DIAGNOSIS — N183 Chronic kidney disease, stage 3 (moderate): Secondary | ICD-10-CM | POA: Diagnosis not present

## 2019-05-27 DIAGNOSIS — I2510 Atherosclerotic heart disease of native coronary artery without angina pectoris: Secondary | ICD-10-CM | POA: Diagnosis not present

## 2019-05-27 DIAGNOSIS — M1990 Unspecified osteoarthritis, unspecified site: Secondary | ICD-10-CM | POA: Diagnosis not present

## 2019-05-27 DIAGNOSIS — J181 Lobar pneumonia, unspecified organism: Secondary | ICD-10-CM | POA: Diagnosis not present

## 2019-05-27 DIAGNOSIS — K219 Gastro-esophageal reflux disease without esophagitis: Secondary | ICD-10-CM | POA: Diagnosis not present

## 2019-05-27 DIAGNOSIS — K5790 Diverticulosis of intestine, part unspecified, without perforation or abscess without bleeding: Secondary | ICD-10-CM | POA: Diagnosis not present

## 2019-05-27 DIAGNOSIS — G9340 Encephalopathy, unspecified: Secondary | ICD-10-CM | POA: Diagnosis not present

## 2019-05-27 DIAGNOSIS — I129 Hypertensive chronic kidney disease with stage 1 through stage 4 chronic kidney disease, or unspecified chronic kidney disease: Secondary | ICD-10-CM | POA: Diagnosis not present

## 2019-05-27 DIAGNOSIS — D3501 Benign neoplasm of right adrenal gland: Secondary | ICD-10-CM | POA: Diagnosis not present

## 2019-06-01 DIAGNOSIS — K219 Gastro-esophageal reflux disease without esophagitis: Secondary | ICD-10-CM | POA: Diagnosis not present

## 2019-06-01 DIAGNOSIS — I2510 Atherosclerotic heart disease of native coronary artery without angina pectoris: Secondary | ICD-10-CM | POA: Diagnosis not present

## 2019-06-01 DIAGNOSIS — N183 Chronic kidney disease, stage 3 (moderate): Secondary | ICD-10-CM | POA: Diagnosis not present

## 2019-06-01 DIAGNOSIS — K5790 Diverticulosis of intestine, part unspecified, without perforation or abscess without bleeding: Secondary | ICD-10-CM | POA: Diagnosis not present

## 2019-06-01 DIAGNOSIS — D3501 Benign neoplasm of right adrenal gland: Secondary | ICD-10-CM | POA: Diagnosis not present

## 2019-06-01 DIAGNOSIS — G9340 Encephalopathy, unspecified: Secondary | ICD-10-CM | POA: Diagnosis not present

## 2019-06-01 DIAGNOSIS — I129 Hypertensive chronic kidney disease with stage 1 through stage 4 chronic kidney disease, or unspecified chronic kidney disease: Secondary | ICD-10-CM | POA: Diagnosis not present

## 2019-06-01 DIAGNOSIS — J181 Lobar pneumonia, unspecified organism: Secondary | ICD-10-CM | POA: Diagnosis not present

## 2019-06-01 DIAGNOSIS — M1990 Unspecified osteoarthritis, unspecified site: Secondary | ICD-10-CM | POA: Diagnosis not present

## 2019-06-04 DIAGNOSIS — K5790 Diverticulosis of intestine, part unspecified, without perforation or abscess without bleeding: Secondary | ICD-10-CM | POA: Diagnosis not present

## 2019-06-04 DIAGNOSIS — K219 Gastro-esophageal reflux disease without esophagitis: Secondary | ICD-10-CM | POA: Diagnosis not present

## 2019-06-04 DIAGNOSIS — N183 Chronic kidney disease, stage 3 (moderate): Secondary | ICD-10-CM | POA: Diagnosis not present

## 2019-06-04 DIAGNOSIS — J181 Lobar pneumonia, unspecified organism: Secondary | ICD-10-CM | POA: Diagnosis not present

## 2019-06-04 DIAGNOSIS — I2510 Atherosclerotic heart disease of native coronary artery without angina pectoris: Secondary | ICD-10-CM | POA: Diagnosis not present

## 2019-06-04 DIAGNOSIS — D3501 Benign neoplasm of right adrenal gland: Secondary | ICD-10-CM | POA: Diagnosis not present

## 2019-06-04 DIAGNOSIS — I129 Hypertensive chronic kidney disease with stage 1 through stage 4 chronic kidney disease, or unspecified chronic kidney disease: Secondary | ICD-10-CM | POA: Diagnosis not present

## 2019-06-04 DIAGNOSIS — G9340 Encephalopathy, unspecified: Secondary | ICD-10-CM | POA: Diagnosis not present

## 2019-06-04 DIAGNOSIS — M1990 Unspecified osteoarthritis, unspecified site: Secondary | ICD-10-CM | POA: Diagnosis not present

## 2019-06-08 DIAGNOSIS — I2510 Atherosclerotic heart disease of native coronary artery without angina pectoris: Secondary | ICD-10-CM | POA: Diagnosis not present

## 2019-06-08 DIAGNOSIS — M1990 Unspecified osteoarthritis, unspecified site: Secondary | ICD-10-CM | POA: Diagnosis not present

## 2019-06-08 DIAGNOSIS — K219 Gastro-esophageal reflux disease without esophagitis: Secondary | ICD-10-CM | POA: Diagnosis not present

## 2019-06-08 DIAGNOSIS — G9340 Encephalopathy, unspecified: Secondary | ICD-10-CM | POA: Diagnosis not present

## 2019-06-08 DIAGNOSIS — N183 Chronic kidney disease, stage 3 (moderate): Secondary | ICD-10-CM | POA: Diagnosis not present

## 2019-06-08 DIAGNOSIS — D3501 Benign neoplasm of right adrenal gland: Secondary | ICD-10-CM | POA: Diagnosis not present

## 2019-06-08 DIAGNOSIS — I129 Hypertensive chronic kidney disease with stage 1 through stage 4 chronic kidney disease, or unspecified chronic kidney disease: Secondary | ICD-10-CM | POA: Diagnosis not present

## 2019-06-08 DIAGNOSIS — J181 Lobar pneumonia, unspecified organism: Secondary | ICD-10-CM | POA: Diagnosis not present

## 2019-06-08 DIAGNOSIS — K5790 Diverticulosis of intestine, part unspecified, without perforation or abscess without bleeding: Secondary | ICD-10-CM | POA: Diagnosis not present

## 2019-06-10 DIAGNOSIS — I129 Hypertensive chronic kidney disease with stage 1 through stage 4 chronic kidney disease, or unspecified chronic kidney disease: Secondary | ICD-10-CM | POA: Diagnosis not present

## 2019-06-10 DIAGNOSIS — K5790 Diverticulosis of intestine, part unspecified, without perforation or abscess without bleeding: Secondary | ICD-10-CM | POA: Diagnosis not present

## 2019-06-10 DIAGNOSIS — J181 Lobar pneumonia, unspecified organism: Secondary | ICD-10-CM | POA: Diagnosis not present

## 2019-06-10 DIAGNOSIS — N183 Chronic kidney disease, stage 3 (moderate): Secondary | ICD-10-CM | POA: Diagnosis not present

## 2019-06-10 DIAGNOSIS — I2510 Atherosclerotic heart disease of native coronary artery without angina pectoris: Secondary | ICD-10-CM | POA: Diagnosis not present

## 2019-06-10 DIAGNOSIS — G9340 Encephalopathy, unspecified: Secondary | ICD-10-CM | POA: Diagnosis not present

## 2019-06-10 DIAGNOSIS — K219 Gastro-esophageal reflux disease without esophagitis: Secondary | ICD-10-CM | POA: Diagnosis not present

## 2019-06-10 DIAGNOSIS — M1990 Unspecified osteoarthritis, unspecified site: Secondary | ICD-10-CM | POA: Diagnosis not present

## 2019-06-10 DIAGNOSIS — D3501 Benign neoplasm of right adrenal gland: Secondary | ICD-10-CM | POA: Diagnosis not present

## 2019-06-17 DIAGNOSIS — K219 Gastro-esophageal reflux disease without esophagitis: Secondary | ICD-10-CM | POA: Diagnosis not present

## 2019-06-17 DIAGNOSIS — I129 Hypertensive chronic kidney disease with stage 1 through stage 4 chronic kidney disease, or unspecified chronic kidney disease: Secondary | ICD-10-CM | POA: Diagnosis not present

## 2019-06-17 DIAGNOSIS — G9340 Encephalopathy, unspecified: Secondary | ICD-10-CM | POA: Diagnosis not present

## 2019-06-17 DIAGNOSIS — D3501 Benign neoplasm of right adrenal gland: Secondary | ICD-10-CM | POA: Diagnosis not present

## 2019-06-17 DIAGNOSIS — M1990 Unspecified osteoarthritis, unspecified site: Secondary | ICD-10-CM | POA: Diagnosis not present

## 2019-06-17 DIAGNOSIS — N183 Chronic kidney disease, stage 3 (moderate): Secondary | ICD-10-CM | POA: Diagnosis not present

## 2019-06-17 DIAGNOSIS — J181 Lobar pneumonia, unspecified organism: Secondary | ICD-10-CM | POA: Diagnosis not present

## 2019-06-17 DIAGNOSIS — I2510 Atherosclerotic heart disease of native coronary artery without angina pectoris: Secondary | ICD-10-CM | POA: Diagnosis not present

## 2019-06-17 DIAGNOSIS — K5790 Diverticulosis of intestine, part unspecified, without perforation or abscess without bleeding: Secondary | ICD-10-CM | POA: Diagnosis not present

## 2019-06-22 DIAGNOSIS — M1990 Unspecified osteoarthritis, unspecified site: Secondary | ICD-10-CM | POA: Diagnosis not present

## 2019-06-22 DIAGNOSIS — D3501 Benign neoplasm of right adrenal gland: Secondary | ICD-10-CM | POA: Diagnosis not present

## 2019-06-22 DIAGNOSIS — J181 Lobar pneumonia, unspecified organism: Secondary | ICD-10-CM | POA: Diagnosis not present

## 2019-06-22 DIAGNOSIS — K5790 Diverticulosis of intestine, part unspecified, without perforation or abscess without bleeding: Secondary | ICD-10-CM | POA: Diagnosis not present

## 2019-06-22 DIAGNOSIS — N183 Chronic kidney disease, stage 3 (moderate): Secondary | ICD-10-CM | POA: Diagnosis not present

## 2019-06-22 DIAGNOSIS — K219 Gastro-esophageal reflux disease without esophagitis: Secondary | ICD-10-CM | POA: Diagnosis not present

## 2019-06-22 DIAGNOSIS — I2510 Atherosclerotic heart disease of native coronary artery without angina pectoris: Secondary | ICD-10-CM | POA: Diagnosis not present

## 2019-06-22 DIAGNOSIS — I129 Hypertensive chronic kidney disease with stage 1 through stage 4 chronic kidney disease, or unspecified chronic kidney disease: Secondary | ICD-10-CM | POA: Diagnosis not present

## 2019-06-22 DIAGNOSIS — G9340 Encephalopathy, unspecified: Secondary | ICD-10-CM | POA: Diagnosis not present

## 2019-06-26 DIAGNOSIS — R55 Syncope and collapse: Secondary | ICD-10-CM | POA: Diagnosis not present

## 2019-06-26 DIAGNOSIS — N183 Chronic kidney disease, stage 3 (moderate): Secondary | ICD-10-CM | POA: Diagnosis not present

## 2019-06-26 DIAGNOSIS — Z23 Encounter for immunization: Secondary | ICD-10-CM | POA: Diagnosis not present

## 2019-06-26 DIAGNOSIS — D509 Iron deficiency anemia, unspecified: Secondary | ICD-10-CM | POA: Diagnosis not present

## 2019-06-26 DIAGNOSIS — R310 Gross hematuria: Secondary | ICD-10-CM | POA: Diagnosis not present

## 2019-08-31 DIAGNOSIS — M5136 Other intervertebral disc degeneration, lumbar region: Secondary | ICD-10-CM | POA: Diagnosis not present

## 2019-08-31 DIAGNOSIS — M5416 Radiculopathy, lumbar region: Secondary | ICD-10-CM | POA: Diagnosis not present

## 2019-08-31 DIAGNOSIS — M48062 Spinal stenosis, lumbar region with neurogenic claudication: Secondary | ICD-10-CM | POA: Diagnosis not present

## 2020-04-22 ENCOUNTER — Ambulatory Visit: Payer: Medicare HMO | Admitting: Internal Medicine

## 2020-08-05 ENCOUNTER — Other Ambulatory Visit: Payer: Self-pay | Admitting: Internal Medicine

## 2022-05-08 ENCOUNTER — Other Ambulatory Visit: Payer: Self-pay

## 2022-05-08 ENCOUNTER — Inpatient Hospital Stay (HOSPITAL_BASED_OUTPATIENT_CLINIC_OR_DEPARTMENT_OTHER)
Admit: 2022-05-08 | Discharge: 2022-05-08 | Disposition: A | Discharge status: Home / Self Care | Payer: Medicare HMO | Attending: Internal Medicine | Admitting: Internal Medicine

## 2022-05-08 ENCOUNTER — Emergency Department: Payer: Medicare HMO

## 2022-05-08 ENCOUNTER — Observation Stay
Admission: EM | Admit: 2022-05-08 | Discharge: 2022-05-09 | Disposition: A | Discharge status: Home / Self Care | Payer: Medicare HMO | Attending: Hospitalist | Admitting: Hospitalist

## 2022-05-08 DIAGNOSIS — S199XXA Unspecified injury of neck, initial encounter: Secondary | ICD-10-CM | POA: Diagnosis not present

## 2022-05-08 DIAGNOSIS — M47812 Spondylosis without myelopathy or radiculopathy, cervical region: Secondary | ICD-10-CM | POA: Diagnosis not present

## 2022-05-08 DIAGNOSIS — R471 Dysarthria and anarthria: Secondary | ICD-10-CM | POA: Insufficient documentation

## 2022-05-08 DIAGNOSIS — R778 Other specified abnormalities of plasma proteins: Secondary | ICD-10-CM | POA: Diagnosis not present

## 2022-05-08 DIAGNOSIS — K5730 Diverticulosis of large intestine without perforation or abscess without bleeding: Secondary | ICD-10-CM | POA: Insufficient documentation

## 2022-05-08 DIAGNOSIS — Z79899 Other long term (current) drug therapy: Secondary | ICD-10-CM | POA: Diagnosis not present

## 2022-05-08 DIAGNOSIS — S01511A Laceration without foreign body of lip, initial encounter: Secondary | ICD-10-CM | POA: Diagnosis not present

## 2022-05-08 DIAGNOSIS — I6782 Cerebral ischemia: Secondary | ICD-10-CM | POA: Diagnosis not present

## 2022-05-08 DIAGNOSIS — M5030 Other cervical disc degeneration, unspecified cervical region: Secondary | ICD-10-CM | POA: Insufficient documentation

## 2022-05-08 DIAGNOSIS — Z86018 Personal history of other benign neoplasm: Secondary | ICD-10-CM | POA: Diagnosis not present

## 2022-05-08 DIAGNOSIS — J9811 Atelectasis: Secondary | ICD-10-CM | POA: Diagnosis not present

## 2022-05-08 DIAGNOSIS — R4182 Altered mental status, unspecified: Secondary | ICD-10-CM

## 2022-05-08 DIAGNOSIS — I440 Atrioventricular block, first degree: Secondary | ICD-10-CM | POA: Diagnosis not present

## 2022-05-08 DIAGNOSIS — R55 Syncope and collapse: Secondary | ICD-10-CM

## 2022-05-08 DIAGNOSIS — N183 Chronic kidney disease, stage 3 unspecified: Secondary | ICD-10-CM | POA: Insufficient documentation

## 2022-05-08 DIAGNOSIS — I080 Rheumatic disorders of both mitral and aortic valves: Secondary | ICD-10-CM | POA: Insufficient documentation

## 2022-05-08 DIAGNOSIS — R001 Bradycardia, unspecified: Secondary | ICD-10-CM | POA: Diagnosis present

## 2022-05-08 DIAGNOSIS — M25561 Pain in right knee: Secondary | ICD-10-CM | POA: Insufficient documentation

## 2022-05-08 DIAGNOSIS — Z7982 Long term (current) use of aspirin: Secondary | ICD-10-CM | POA: Diagnosis not present

## 2022-05-08 DIAGNOSIS — E785 Hyperlipidemia, unspecified: Secondary | ICD-10-CM | POA: Insufficient documentation

## 2022-05-08 DIAGNOSIS — N138 Other obstructive and reflux uropathy: Secondary | ICD-10-CM | POA: Diagnosis present

## 2022-05-08 DIAGNOSIS — I10 Essential (primary) hypertension: Secondary | ICD-10-CM

## 2022-05-08 DIAGNOSIS — R5381 Other malaise: Secondary | ICD-10-CM

## 2022-05-08 DIAGNOSIS — N401 Enlarged prostate with lower urinary tract symptoms: Secondary | ICD-10-CM | POA: Diagnosis not present

## 2022-05-08 DIAGNOSIS — Y92009 Unspecified place in unspecified non-institutional (private) residence as the place of occurrence of the external cause: Secondary | ICD-10-CM | POA: Insufficient documentation

## 2022-05-08 DIAGNOSIS — Z91148 Patient's other noncompliance with medication regimen for other reason: Secondary | ICD-10-CM | POA: Insufficient documentation

## 2022-05-08 DIAGNOSIS — S0990XA Unspecified injury of head, initial encounter: Secondary | ICD-10-CM | POA: Diagnosis not present

## 2022-05-08 DIAGNOSIS — I700 Atherosclerosis of aorta: Secondary | ICD-10-CM | POA: Insufficient documentation

## 2022-05-08 DIAGNOSIS — R2681 Unsteadiness on feet: Secondary | ICD-10-CM | POA: Insufficient documentation

## 2022-05-08 DIAGNOSIS — I129 Hypertensive chronic kidney disease with stage 1 through stage 4 chronic kidney disease, or unspecified chronic kidney disease: Secondary | ICD-10-CM | POA: Insufficient documentation

## 2022-05-08 DIAGNOSIS — M6281 Muscle weakness (generalized): Secondary | ICD-10-CM | POA: Insufficient documentation

## 2022-05-08 DIAGNOSIS — I1310 Hypertensive heart and chronic kidney disease without heart failure, with stage 1 through stage 4 chronic kidney disease, or unspecified chronic kidney disease: Secondary | ICD-10-CM | POA: Insufficient documentation

## 2022-05-08 DIAGNOSIS — Z87891 Personal history of nicotine dependence: Secondary | ICD-10-CM | POA: Insufficient documentation

## 2022-05-08 DIAGNOSIS — R9431 Abnormal electrocardiogram [ECG] [EKG]: Secondary | ICD-10-CM | POA: Insufficient documentation

## 2022-05-08 DIAGNOSIS — I4891 Unspecified atrial fibrillation: Secondary | ICD-10-CM | POA: Insufficient documentation

## 2022-05-08 DIAGNOSIS — Z9181 History of falling: Secondary | ICD-10-CM | POA: Insufficient documentation

## 2022-05-08 DIAGNOSIS — G319 Degenerative disease of nervous system, unspecified: Secondary | ICD-10-CM | POA: Diagnosis not present

## 2022-05-08 DIAGNOSIS — G9341 Metabolic encephalopathy: Secondary | ICD-10-CM | POA: Diagnosis not present

## 2022-05-08 DIAGNOSIS — Z8679 Personal history of other diseases of the circulatory system: Secondary | ICD-10-CM | POA: Diagnosis not present

## 2022-05-08 DIAGNOSIS — N1832 Chronic kidney disease, stage 3b: Secondary | ICD-10-CM | POA: Insufficient documentation

## 2022-05-08 DIAGNOSIS — W19XXXA Unspecified fall, initial encounter: Secondary | ICD-10-CM | POA: Diagnosis not present

## 2022-05-08 DIAGNOSIS — I2510 Atherosclerotic heart disease of native coronary artery without angina pectoris: Secondary | ICD-10-CM

## 2022-05-08 DIAGNOSIS — J3489 Other specified disorders of nose and nasal sinuses: Secondary | ICD-10-CM | POA: Diagnosis not present

## 2022-05-08 DIAGNOSIS — Z955 Presence of coronary angioplasty implant and graft: Secondary | ICD-10-CM | POA: Insufficient documentation

## 2022-05-08 LAB — CBC WITH DIFFERENTIAL/PLATELET
Abs Immature Granulocytes: 0.03 10*3/uL (ref 0.00–0.07)
Basophils Absolute: 0 10*3/uL (ref 0.0–0.1)
Basophils Relative: 0 %
Eosinophils Absolute: 0.2 10*3/uL (ref 0.0–0.5)
Eosinophils Relative: 3 %
HCT: 30.9 % — ABNORMAL LOW (ref 39.0–52.0)
Hemoglobin: 10.2 g/dL — ABNORMAL LOW (ref 13.0–17.0)
Immature Granulocytes: 0 %
Lymphocytes Relative: 44 %
Lymphs Abs: 3.3 10*3/uL (ref 0.7–4.0)
MCH: 31.4 pg (ref 26.0–34.0)
MCHC: 33 g/dL (ref 30.0–36.0)
MCV: 95.1 fL (ref 80.0–100.0)
Monocytes Absolute: 0.5 10*3/uL (ref 0.1–1.0)
Monocytes Relative: 6 %
Neutro Abs: 3.5 10*3/uL (ref 1.7–7.7)
Neutrophils Relative %: 47 %
Platelets: 261 10*3/uL (ref 150–400)
RBC: 3.25 MIL/uL — ABNORMAL LOW (ref 4.22–5.81)
RDW: 13.8 % (ref 11.5–15.5)
WBC: 7.6 10*3/uL (ref 4.0–10.5)
nRBC: 0 % (ref 0.0–0.2)

## 2022-05-08 LAB — COMPREHENSIVE METABOLIC PANEL
ALT: 13 U/L (ref 0–44)
AST: 15 U/L (ref 15–41)
Albumin: 3.6 g/dL (ref 3.5–5.0)
Alkaline Phosphatase: 50 U/L (ref 38–126)
Anion gap: 6 (ref 5–15)
BUN: 21 mg/dL (ref 8–23)
CO2: 27 mmol/L (ref 22–32)
Calcium: 8.7 mg/dL — ABNORMAL LOW (ref 8.9–10.3)
Chloride: 107 mmol/L (ref 98–111)
Creatinine, Ser: 1.67 mg/dL — ABNORMAL HIGH (ref 0.61–1.24)
GFR, Estimated: 39 mL/min — ABNORMAL LOW (ref >60)
Glucose, Bld: 95 mg/dL (ref 70–99)
Potassium: 3.6 mmol/L (ref 3.5–5.1)
Sodium: 140 mmol/L (ref 135–145)
Total Bilirubin: 0.6 mg/dL (ref 0.3–1.2)
Total Protein: 6.4 g/dL — ABNORMAL LOW (ref 6.5–8.1)

## 2022-05-08 LAB — TROPONIN I (HIGH SENSITIVITY)
Troponin I (High Sensitivity): 34 ng/L — ABNORMAL HIGH (ref <18)
Troponin I (High Sensitivity): 29 ng/L — ABNORMAL HIGH (ref <18)

## 2022-05-08 LAB — URINALYSIS, ROUTINE W REFLEX MICROSCOPIC
Bacteria, UA: NONE SEEN
Bilirubin Urine: NEGATIVE
Glucose, UA: NEGATIVE mg/dL
Ketones, ur: NEGATIVE mg/dL
Leukocytes,Ua: NEGATIVE
Nitrite: NEGATIVE
Protein, ur: 100 mg/dL — AB
Specific Gravity, Urine: 1.006 (ref 1.005–1.030)
Squamous Epithelial / LPF: NONE SEEN (ref 0–5)
pH: 7 (ref 5.0–8.0)

## 2022-05-08 LAB — BRAIN NATRIURETIC PEPTIDE: B Natriuretic Peptide: 519.1 pg/mL — ABNORMAL HIGH (ref 0.0–100.0)

## 2022-05-08 LAB — ECHOCARDIOGRAM COMPLETE
AR max vel: 2.63 cm2
AV Peak grad: 5 mmHg
Ao pk vel: 1.12 m/s
Area-P 1/2: 3.36 cm2
Calc EF: 57.8 %
Height: 70 in
P 1/2 time: 752 msec
S' Lateral: 2.98 cm
Single Plane A2C EF: 52.3 %
Single Plane A4C EF: 63 %
Weight: 2800 oz

## 2022-05-08 LAB — CK: Total CK: 63 U/L (ref 49–397)

## 2022-05-08 LAB — TSH: TSH: 2.971 u[IU]/mL (ref 0.350–4.500)

## 2022-05-08 LAB — MAGNESIUM: Magnesium: 2 mg/dL (ref 1.7–2.4)

## 2022-05-08 LAB — LACTIC ACID, PLASMA: Lactic Acid, Venous: 1.4 mmol/L (ref 0.5–1.9)

## 2022-05-08 MED ORDER — SIMVASTATIN 20 MG PO TABS
20.0000 mg | ORAL_TABLET | Freq: Every day | ORAL | Status: DC
  Administered 2022-05-09: 20 mg via ORAL
  Filled 2022-05-08: qty 1

## 2022-05-08 MED ORDER — ONDANSETRON HCL 4 MG/2ML IJ SOLN: 4.0000 mg | Freq: Four times a day (QID) | INTRAMUSCULAR | Status: DC | PRN

## 2022-05-08 MED ORDER — ACETAMINOPHEN 325 MG PO TABS
650.0000 mg | ORAL_TABLET | Freq: Four times a day (QID) | ORAL | Status: DC | PRN
  Administered 2022-05-08: 650 mg via ORAL
  Filled 2022-05-08: qty 2

## 2022-05-08 MED ORDER — ACETAMINOPHEN 650 MG RE SUPP: 650.0000 mg | Freq: Four times a day (QID) | RECTAL | Status: DC | PRN

## 2022-05-08 MED ORDER — AMLODIPINE BESYLATE 5 MG PO TABS: 5.0000 mg | ORAL_TABLET | Freq: Every day | ORAL | Status: DC

## 2022-05-08 MED ORDER — HYDRALAZINE HCL 20 MG/ML IJ SOLN
10.0000 mg | INTRAMUSCULAR | Status: DC | PRN
Start: 2022-05-08 — End: 2022-05-09

## 2022-05-08 MED ORDER — SENNOSIDES-DOCUSATE SODIUM 8.6-50 MG PO TABS: 1.0000 | ORAL_TABLET | Freq: Every evening | ORAL | Status: DC | PRN

## 2022-05-08 MED ORDER — LACTATED RINGERS IV SOLN: INTRAVENOUS | Status: AC

## 2022-05-08 MED ORDER — HYDRALAZINE HCL 20 MG/ML IJ SOLN
10.0000 mg | Freq: Once | INTRAMUSCULAR | Status: AC
Start: 2022-05-08 — End: 2022-05-08
  Administered 2022-05-08: 10 mg via INTRAVENOUS
  Filled 2022-05-08: qty 1

## 2022-05-08 MED ORDER — ONDANSETRON HCL 4 MG PO TABS: 4.0000 mg | ORAL_TABLET | Freq: Four times a day (QID) | ORAL | Status: DC | PRN

## 2022-05-08 MED ORDER — AMLODIPINE BESYLATE 5 MG PO TABS
5.0000 mg | ORAL_TABLET | Freq: Once | ORAL | Status: AC
  Administered 2022-05-08: 5 mg via ORAL
  Filled 2022-05-08: qty 1

## 2022-05-08 MED ORDER — ASPIRIN 81 MG PO TBEC
81.0000 mg | DELAYED_RELEASE_TABLET | Freq: Every day | ORAL | Status: DC
  Administered 2022-05-09: 81 mg via ORAL
  Filled 2022-05-08: qty 1

## 2022-05-08 MED ORDER — NIFEDIPINE ER OSMOTIC RELEASE 30 MG PO TB24
30.0000 mg | ORAL_TABLET | Freq: Every day | ORAL | Status: AC
Start: 2022-05-08 — End: 2022-05-08
  Administered 2022-05-08: 30 mg via ORAL
  Filled 2022-05-08: qty 1

## 2022-05-08 MED ORDER — LOSARTAN POTASSIUM 50 MG PO TABS
100.0000 mg | ORAL_TABLET | Freq: Every day | ORAL | Status: DC
  Administered 2022-05-09: 100 mg via ORAL
  Filled 2022-05-08: qty 2

## 2022-05-08 MED ORDER — AMLODIPINE BESYLATE 10 MG PO TABS: 10.0000 mg | ORAL_TABLET | Freq: Every day | ORAL | Status: DC

## 2022-05-08 MED ORDER — LOSARTAN POTASSIUM 50 MG PO TABS
100.0000 mg | ORAL_TABLET | Freq: Once | ORAL | Status: AC
  Administered 2022-05-08: 100 mg via ORAL
  Filled 2022-05-08: qty 2

## 2022-05-08 MED ORDER — LIDOCAINE HCL (PF) 1 % IJ SOLN
5.0000 mL | Freq: Once | INTRAMUSCULAR | Status: AC
  Administered 2022-05-08: 5 mL
  Filled 2022-05-08: qty 5

## 2022-05-08 MED ORDER — ENOXAPARIN SODIUM 40 MG/0.4ML IJ SOSY
40.0000 mg | PREFILLED_SYRINGE | INTRAMUSCULAR | Status: DC
Start: 2022-05-08 — End: 2022-05-09
  Administered 2022-05-08: 40 mg via SUBCUTANEOUS
  Filled 2022-05-08: qty 0.4

## 2022-05-08 MED ORDER — HYDROCHLOROTHIAZIDE 12.5 MG PO TABS
12.5000 mg | ORAL_TABLET | Freq: Once | ORAL | Status: AC
  Administered 2022-05-08: 12.5 mg via ORAL
  Filled 2022-05-08: qty 1

## 2022-05-08 MED ORDER — LABETALOL HCL 5 MG/ML IV SOLN
10.0000 mg | Freq: Once | INTRAVENOUS | Status: AC
Start: 2022-05-08 — End: 2022-05-08
  Administered 2022-05-08: 10 mg via INTRAVENOUS
  Filled 2022-05-08: qty 4

## 2022-05-08 MED ORDER — DICLOFENAC SODIUM 1 % EX GEL
4.0000 g | Freq: Four times a day (QID) | CUTANEOUS | Status: DC
  Filled 2022-05-08 (×2): qty 100

## 2022-05-08 MED ORDER — HYDRALAZINE HCL 20 MG/ML IJ SOLN
5.0000 mg | INTRAMUSCULAR | Status: DC | PRN
Start: 2022-05-08 — End: 2022-05-08
  Administered 2022-05-08: 5 mg via INTRAVENOUS
  Filled 2022-05-08: qty 1

## 2022-05-08 NOTE — ED Notes (Signed)
ED TO INPATIENT HANDOFF REPORT  ED Nurse Name and Phone #: Darnelle Maffucci [phone]  D Name/Age/Gender Charles Sims 88 y.o. male Room/Bed: ED15A/ED15A  Code Status   Code Status: Full Code  Home/SNF/Other Home Patient oriented to: self and place Is this baseline? Yes   Triage Complete: Triage complete  Chief Complaint Acute encephalopathy [G93.40]  Triage Note Pt to er room number 15 via ems, per ems pt was found down by ems, pt states that he doesn't know what happened to him, pt has dried blood on face and L hand, denies sob or chest pain.  Pt awake and oriented to self, doesn't know time or place, reoriented pt.  Pt has lac to lip   Allergies Allergies  Allergen Reactions   Lisinopril Cough    Level of Care/Admitting Diagnosis ED Disposition     ED Disposition  Admit   Condition  --   [place]: [place] [[id number]]  Level of Care: Med-Surg [16]  Covid Evaluation: Asymptomatic - no recent exposure (last 10 days) testing not required  Diagnosis: Acute encephalopathy [[id number]]  Admitting Physician: George Hugh [[id number]]  Attending Physician: George Hugh [[id number]]  Estimated length of stay: past midnight tomorrow  Certification:: I certify this patient will need inpatient services for at least 2 midnights          B Medical/Surgery History Past Medical History:  Diagnosis Date   Adenoma of right adrenal gland    Arthritis    Benign prostatic hypertrophy with lower urinary tract symptoms (LUTS)    Bladder calculi    Bradycardia    CAD (coronary artery disease)    Carotid stenosis    Coronary artery disease    Dysrhythmia    bradycardia   Elevated PSA    GERD (gastroesophageal reflux disease)    Gross hematuria    Hyperlipidemia    Hypertension    Prostatitis    Shortness of breath dyspnea    Urinary frequency    Past Surgical History:  Procedure Laterality Date   CAROTID ENDARTERECTOMY Left    CYSTOSCOPY WITH  LITHOLAPAXY N/A 04/13/2015   Procedure: CYSTOSCOPY WITH LITHOLAPAXY;  Surgeon: Hollice Espy, MD;  Location: [facility] ORS;  Service: Urology;  Laterality: N/A;     A IV Location/Drains/Wounds Patient Lines/Drains/Airways Status     Active Line/Drains/Airways     Name Placement date Placement time Site Days   Peripheral IV 05/08/22 18 G Anterior;Proximal;Right Forearm 05/08/22  1135  Forearm  less than 1   Peripheral IV 05/08/22 20 G Anterior;Left Forearm 05/08/22  1433  Forearm  less than 1   Incision (Closed) 04/13/15 Penis 04/13/15  1257  -- 2582            Intake/Output Last 24 hours No intake or output data in the 24 hours ending 05/08/22 1512  Labs/Imaging Results for orders placed or performed during the hospital encounter of 05/08/22 (from the past 48 hour(s))  CBC with Differential     Status: Abnormal   Collection Time: 05/08/22 11:27 AM  Result Value Ref Range   WBC 7.6 4.0 - 10.5 K/uL   RBC 3.25 (L) 4.22 - 5.81 MIL/uL   Hemoglobin 10.2 (L) 13.0 - 17.0 g/dL   HCT 30.9 (L) 39.0 - 52.0 %   MCV 95.1 80.0 - 100.0 fL   MCH 31.4 26.0 - 34.0 pg   MCHC 33.0 30.0 - 36.0 g/dL   RDW 13.8 11.5 - 15.5 %   Platelets  261 150 - 400 K/uL   nRBC 0.0 0.0 - 0.2 %   Neutrophils Relative % 47 %   Neutro Abs 3.5 1.7 - 7.7 K/uL   Lymphocytes Relative 44 %   Lymphs Abs 3.3 0.7 - 4.0 K/uL   Monocytes Relative 6 %   Monocytes Absolute 0.5 0.1 - 1.0 K/uL   Eosinophils Relative 3 %   Eosinophils Absolute 0.2 0.0 - 0.5 K/uL   Basophils Relative 0 %   Basophils Absolute 0.0 0.0 - 0.1 K/uL   Immature Granulocytes 0 %   Abs Immature Granulocytes 0.03 0.00 - 0.07 K/uL    Comment: Performed at [facility], [address] [place], [place] [address]  Comprehensive metabolic panel     Status: Abnormal   Collection Time: 05/08/22 11:27 AM  Result Value Ref Range   Sodium 140 135 - 145 mmol/L   Potassium 3.6 3.5 - 5.1 mmol/L   Chloride 107 98 - 111 mmol/L   CO2 27 22 - 32 mmol/L    Glucose, Bld 95 70 - 99 mg/dL    Comment: Glucose reference range applies only to samples taken after fasting for at least 8 hours.   BUN 21 8 - 23 mg/dL   Creatinine, Ser 1.67 (H) 0.61 - 1.24 mg/dL   Calcium 8.7 (L) 8.9 - 10.3 mg/dL   Total Protein 6.4 (L) 6.5 - 8.1 g/dL   Albumin 3.6 3.5 - 5.0 g/dL   AST 15 15 - 41 U/L   ALT 13 0 - 44 U/L   Alkaline Phosphatase 50 38 - 126 U/L   Total Bilirubin 0.6 0.3 - 1.2 mg/dL   GFR, Estimated 39 (L) >60 mL/min    Comment: (NOTE) Calculated using the CKD-EPI Creatinine Equation (2021)    Anion gap 6 5 - 15    Comment: Performed at [facility], [place], [place] [address]  Troponin I (High Sensitivity)     Status: Abnormal   Collection Time: 05/08/22 11:27 AM  Result Value Ref Range   Troponin I (High Sensitivity) 34 (H) <18 ng/L    Comment: (NOTE) Elevated high sensitivity troponin I (hsTnI) values and significant  changes across serial measurements may suggest ACS but many other  chronic and acute conditions are known to elevate hsTnI results.  Refer to the "Links" section for chest pain algorithms and additional  guidance. Performed at [facility], [address] [place], [place] [address]   Urinalysis, Routine w reflex microscopic Urine, Clean Catch     Status: Abnormal   Collection Time: 05/08/22 11:27 AM  Result Value Ref Range   Color, Urine COLORLESS (A) YELLOW   APPearance CLEAR (A) CLEAR   Specific Gravity, Urine 1.006 1.005 - 1.030   pH 7.0 5.0 - 8.0   Glucose, UA NEGATIVE NEGATIVE mg/dL   Hgb urine dipstick SMALL (A) NEGATIVE   Bilirubin Urine NEGATIVE NEGATIVE   Ketones, ur NEGATIVE NEGATIVE mg/dL   Protein, ur 100 (A) NEGATIVE mg/dL   Nitrite NEGATIVE NEGATIVE   Leukocytes,Ua NEGATIVE NEGATIVE   RBC / HPF 0-5 0 - 5 RBC/hpf   WBC, UA 0-5 0 - 5 WBC/hpf   Bacteria, UA NONE SEEN NONE SEEN   Squamous Epithelial / LPF NONE SEEN 0 - 5   Mucus PRESENT    Hyaline Casts, UA PRESENT      Comment: Performed at [facility], [address], [place], [place] [address]  Magnesium     Status:  None   Collection Time: 05/08/22 11:27 AM  Result Value Ref Range   Magnesium 2.0 1.7 - 2.4 mg/dL    Comment: Performed at [facility], [address] [place], [place] [address]  CK     Status: None   Collection Time: 05/08/22 11:27 AM  Result Value Ref Range   Total CK 63 49 - 397 U/L    Comment: Performed at [facility], [place], [place] [address]  Troponin I (High Sensitivity)     Status: Abnormal   Collection Time: 05/08/22  1:40 PM  Result Value Ref Range   Troponin I (High Sensitivity) 29 (H) <18 ng/L    Comment: (NOTE) Elevated high sensitivity troponin I (hsTnI) values and significant  changes across serial measurements may suggest ACS but many other  chronic and acute conditions are known to elevate hsTnI results.  Refer to the "Links" section for chest pain algorithms and additional  guidance. Performed at [facility], [address] [place], [place] [address]   Brain natriuretic peptide     Status: Abnormal   Collection Time: 05/08/22  1:40 PM  Result Value Ref Range   B Natriuretic Peptide 519.1 (H) 0.0 - 100.0 pg/mL    Comment: Performed at [facility], [address] [place], [place] [address]  TSH     Status: None   Collection Time: 05/08/22  1:40 PM  Result Value Ref Range   TSH 2.971 0.350 - 4.500 uIU/mL    Comment: Performed by a 3rd Generation assay with a functional sensitivity of <=0.01 uIU/mL. Performed at [facility], [address] [place], [place] [address]   Lactic acid, plasma     Status: None   Collection Time: 05/08/22  2:24 PM  Result Value Ref Range   Lactic Acid, Venous 1.4 0.5 - 1.9 mmol/L    Comment: Performed at [facility], [address] [place],  [address]   DG Knee 2 Views Right  Result Date: 05/08/2022 CLINICAL DATA:  Right knee  pain EXAM: RIGHT KNEE - 1-2 VIEW COMPARISON:  None Available. FINDINGS: No acute fracture or dislocation identified. Mild narrowing of the medial compartment and patellofemoral joint with small marginal osteophytes. No significant joint effusion. Arterial vascular calcifications noted. IMPRESSION: No acute osseous abnormality identified. Electronically Signed   By: Ofilia Neas M.D.   On: 05/08/2022 12:32   DG Chest 2 View  Result Date: 05/08/2022 CLINICAL DATA:  Fall EXAM: CHEST - 2 VIEW COMPARISON:  Chest x-ray 05/13/2019 FINDINGS: Heart is enlarged. Calcified plaques in the aortic arch. Mild opacities at the lung bases likely represent subsegmental atelectasis. No definite pleural effusion or pneumothorax identified. IMPRESSION: Cardiomegaly and bibasilar subsegmental atelectasis. Electronically Signed   By: Ofilia Neas M.D.   On: 05/08/2022 12:30   CT Head Wo Contrast  Result Date: 05/08/2022 CLINICAL DATA:  Head trauma, minor (Age >= 65y); Neck trauma (Age >= 65y) EXAM: CT HEAD WITHOUT CONTRAST CT CERVICAL SPINE WITHOUT CONTRAST TECHNIQUE: Multidetector CT imaging of the head and cervical spine was performed following the standard protocol without intravenous contrast. Multiplanar CT image reconstructions of the cervical spine were also generated. RADIATION DOSE REDUCTION: This exam was performed according to the departmental dose-optimization program which includes automated exposure control, adjustment of the mA and/or kV according to patient size and/or use of iterative reconstruction technique. COMPARISON:  05/13/2019 FINDINGS: CT HEAD FINDINGS Brain: No evidence of acute infarction, hemorrhage, hydrocephalus, extra-axial collection or  mass lesion/mass effect. Extensive low-density changes within the periventricular and subcortical white matter compatible with chronic microvascular ischemic change. Moderate diffuse cerebral volume loss. Vascular: Atherosclerotic calcifications involving the  large vessels of the skull base. No unexpected hyperdense vessel. Skull: Normal. Negative for fracture or focal lesion. Sinuses/Orbits: Mild paranasal sinus mucosal thickening. No air-fluid levels. Mastoid air cells are clear. Other: Negative for scalp hematoma. CT CERVICAL SPINE FINDINGS Alignment: Facet joints are aligned without dislocation or traumatic listhesis. Dens and lateral masses are aligned. Skull base and vertebrae: No acute fracture. No primary bone lesion or focal pathologic process. Soft tissues and spinal canal: No prevertebral fluid or swelling. No visible canal hematoma. Disc levels: Advanced degenerative disc disease and facet arthropathy throughout the cervical spine. Upper chest: Negative. Other: Well-circumscribed 2.3 cm ovoid cystic lesion within the superficial subcutaneous soft tissues of the posterior right neck at approximately the C4 level, likely a sebaceous or epidermoid cyst. IMPRESSION: 1. No acute intracranial abnormality. 2. No acute fracture or subluxation of the cervical spine. 3. Advanced chronic microvascular ischemic change and cerebral volume loss. 4. Advanced degenerative disc disease and facet arthropathy throughout the cervical spine. Electronically Signed   By: Davina Poke D.O.   On: 05/08/2022 12:06   CT Cervical Spine Wo Contrast  Result Date: 05/08/2022 CLINICAL DATA:  Head trauma, minor (Age >= 65y); Neck trauma (Age >= 65y) EXAM: CT HEAD WITHOUT CONTRAST CT CERVICAL SPINE WITHOUT CONTRAST TECHNIQUE: Multidetector CT imaging of the head and cervical spine was performed following the standard protocol without intravenous contrast. Multiplanar CT image reconstructions of the cervical spine were also generated. RADIATION DOSE REDUCTION: This exam was performed according to the departmental dose-optimization program which includes automated exposure control, adjustment of the mA and/or kV according to patient size and/or use of iterative reconstruction technique.  COMPARISON:  05/13/2019 FINDINGS: CT HEAD FINDINGS Brain: No evidence of acute infarction, hemorrhage, hydrocephalus, extra-axial collection or mass lesion/mass effect. Extensive low-density changes within the periventricular and subcortical white matter compatible with chronic microvascular ischemic change. Moderate diffuse cerebral volume loss. Vascular: Atherosclerotic calcifications involving the large vessels of the skull base. No unexpected hyperdense vessel. Skull: Normal. Negative for fracture or focal lesion. Sinuses/Orbits: Mild paranasal sinus mucosal thickening. No air-fluid levels. Mastoid air cells are clear. Other: Negative for scalp hematoma. CT CERVICAL SPINE FINDINGS Alignment: Facet joints are aligned without dislocation or traumatic listhesis. Dens and lateral masses are aligned. Skull base and vertebrae: No acute fracture. No primary bone lesion or focal pathologic process. Soft tissues and spinal canal: No prevertebral fluid or swelling. No visible canal hematoma. Disc levels: Advanced degenerative disc disease and facet arthropathy throughout the cervical spine. Upper chest: Negative. Other: Well-circumscribed 2.3 cm ovoid cystic lesion within the superficial subcutaneous soft tissues of the posterior right neck at approximately the C4 level, likely a sebaceous or epidermoid cyst. IMPRESSION: 1. No acute intracranial abnormality. 2. No acute fracture or subluxation of the cervical spine. 3. Advanced chronic microvascular ischemic change and cerebral volume loss. 4. Advanced degenerative disc disease and facet arthropathy throughout the cervical spine. Electronically Signed   By: Davina Poke D.O.   On: 05/08/2022 12:06    Pending Labs Unresulted Labs (From admission, onward)     Start     Ordered   05/09/22 0500  CBC  Tomorrow morning,   R        05/08/22 1337   05/09/22 6712  Basic metabolic panel  Tomorrow morning,   R  05/08/22 1337   05/08/22 1347  Culture, blood  (Routine X 2) w Reflex to ID Panel  BLOOD CULTURE X 2,   R      05/08/22 1346            Vitals/Pain Today's Vitals   05/08/22 1430 05/08/22 1435 05/08/22 1500 05/08/22 1505  BP: (!) 173/108 (!) 188/100 (!) 158/91 (!) 175/89  Pulse: (!) 59 61 (!) 36 70  Resp:      Temp:      TempSrc:      SpO2: 97% 100% 96% 98%  Weight:      Height:      PainSc:        Isolation Precautions No active isolations  Medications Medications  enoxaparin (LOVENOX) injection 40 mg (40 mg Subcutaneous Given 05/08/22 1422)  acetaminophen (TYLENOL) tablet 650 mg (has no administration in time range)    Or  acetaminophen (TYLENOL) suppository 650 mg (has no administration in time range)  senna-docusate (Senokot-S) tablet 1 tablet (has no administration in time range)  ondansetron (ZOFRAN) tablet 4 mg (has no administration in time range)    Or  ondansetron (ZOFRAN) injection 4 mg (has no administration in time range)  lidocaine (PF) (XYLOCAINE) 1 % injection 5 mL (5 mLs Infiltration Given by Other 05/08/22 1147)  labetalol (NORMODYNE) injection 10 mg (10 mg Intravenous Given 05/08/22 1241)  amLODipine (NORVASC) tablet 5 mg (5 mg Oral Given 05/08/22 1243)  losartan (COZAAR) tablet 100 mg (100 mg Oral Given 05/08/22 1243)  hydrochlorothiazide (HYDRODIURIL) tablet 12.5 mg (12.5 mg Oral Given 05/08/22 1243)    Mobility walks High fall risk      R Recommendations: See Admitting Provider Note  Report given to:   Additional Notes:

## 2022-05-08 NOTE — ED Notes (Signed)
Blood culture number 2 drawn via 21 gauge butterfly from R ac on second attempt.

## 2022-05-08 NOTE — ED Notes (Signed)
Pt in bed, md at bedside, md requests to be notified if bp is greater than 180 and requests q4 neuro checks.

## 2022-05-08 NOTE — H&P (Addendum)
History and Physical    Patient: Charles Sims BHA:[id number] DOB: 1930-04-04 DOA: 05/08/2022 DOS: the patient was seen and examined on 05/08/2022 PCP: [name], MD  Patient coming from: Home  Chief Complaint:  Chief Complaint  Patient presents with   Fall   HPI: Charles Sims is a 88 y.o. male with medical history significant of Chronic Hypertension, Hyperlipidemia, Coronary Artery Disease (last LHC in 2013 showed nonobstructive disease), history of bradycardia, and Chronic Kidney Disease Stage 3 who presented to the ED with confusion after sustaining a fall at home.  Patient was found down at home by his family and EMS was called.  The fall was unwitnessed and patient does not recall the details of how he fell.  There was blood and a small laceration noted on the right side of his face that was sutured by ED physician.  Head CT and CT of the cervical spine did not show any acute abnormality.  Charles Sims does not recall any details.  He states, "I don't think I fell" and repeatedly states "I don't remember."  Charles Sims was previously seen by neurologist Dr. Karena Addison in 2020 while hospitalized for stroke like symptoms who documented "suspect poor cognitive baseline given significant atrophy noted on MRI brain."    Upon arrival to the ED, patient's blood pressure was 238/101 mmHg.  He does not take his home blood pressure medications has not seen his primary care physician in three years.  He is prescribed Amlodipine 5 mg daily and Losartan-Hydrochlorothiazide 100-12.5 mg daily.  He denies any current dizziness, chest pain,or shortness of breath, nausea, vomiting, diarrhea, abdominal pain, or urinary symptoms.    Review of Systems: As mentioned in the history of present illness. All other systems reviewed and are negative. Past Medical History:  Diagnosis Date   Adenoma of right adrenal gland    Arthritis    Benign prostatic hypertrophy with lower urinary tract symptoms (LUTS)    Bladder  calculi    Bradycardia    CAD (coronary artery disease)    Carotid stenosis    Coronary artery disease    Dysrhythmia    bradycardia   Elevated PSA    GERD (gastroesophageal reflux disease)    Gross hematuria    Hyperlipidemia    Hypertension    Prostatitis    Shortness of breath dyspnea    Urinary frequency    Past Surgical History:  Procedure Laterality Date   CAROTID ENDARTERECTOMY Left    CYSTOSCOPY WITH LITHOLAPAXY N/A 04/13/2015   Procedure: CYSTOSCOPY WITH LITHOLAPAXY;  Surgeon: Hollice Espy, MD;  Location: [facility] ORS;  Service: Urology;  Laterality: N/A;   Social History:  reports that he quit smoking about 17 years ago. He has never used smokeless tobacco. He reports that he does not drink alcohol and does not use drugs.  Allergies  Allergen Reactions   Lisinopril Cough    Family History  Problem Relation Age of Onset   Kidney cancer Father    Kidney cancer Paternal Grandfather    Alzheimer's disease Mother     Prior to Admission medications   Medication Sig Start Date End Date Taking? Authorizing Provider  acetaminophen (TYLENOL) 500 MG tablet Take 1-2 tablets (500-1,000 mg total) by mouth every 8 (eight) hours as needed for mild pain or moderate pain. 05/16/19   Mariel Aloe, MD  amLODipine (NORVASC) 5 MG tablet Take 1 tablet (5 mg total) by mouth daily. 05/16/19   Mariel Aloe, MD  aspirin  EC 81 MG tablet Take 81 mg by mouth daily.    [provider]  diclofenac sodium (VOLTAREN) 1 % GEL Apply 4 g topically 4 (four) times daily. Apply to hip 05/16/19   Mariel Aloe, MD  loperamide (IMODIUM A-D) 2 MG tablet Take 2 mg by mouth daily.    [provider]  losartan-hydrochlorothiazide (HYZAAR) 100-12.5 MG tablet TAKE 1 TABLET EVERY DAY 08/08/20   [name], MD  omeprazole (PRILOSEC) 20 MG capsule Take 20 mg by mouth daily.    [provider]  simvastatin (ZOCOR) 20 MG tablet TAKE 1 TABLET EVERY DAY 08/08/20   [name], MD   traMADol (ULTRAM) 50 MG tablet Take 50 mg by mouth every 6 (six) hours as needed for moderate pain.  05/18/15   [provider]    Physical Exam: Vitals:   05/08/22 1208 05/08/22 1230 05/08/22 1300 05/08/22 1333  BP: (!) 207/125 (!) 223/95 (!) 179/93 (!) 155/105  Pulse: 67 72 (!) 106 70  Resp: 17   17  Temp:    97.7 F (36.5 C)  TempSrc:    Oral  SpO2:  98% (!) 89% 94%  Weight:      Height:       Examination: General exam: chronically ill appearing elderly gentleman, confused HEENT: NCAT, PERRL Respiratory system: CTAB no WRR Cardiovascular system: 2/6 systolic murmur, regular, No JVD. Gastrointestinal system: No flank pain, Abdomen soft, NT,ND, BS+. Nervous System: Confused, disoriented to place and location.  Unable to provide history. Speech is dysarthric.  Motor strength is intact in all 4 extremities.  Sensation is in tact.  Resting tremor in upper extremities. Extremities: No edema, distal peripheral pulses palpable.  Skin: Scattered bruises.  No rashes or lesions. MSK: Physical Deconditioning   Data Reviewed:  DG Knee 2 Views Right CLINICAL DATA:  Right knee pain  EXAM: RIGHT KNEE - 1-2 VIEW  COMPARISON:  None Available.  FINDINGS: No acute fracture or dislocation identified. Mild narrowing of the medial compartment and patellofemoral joint with small marginal osteophytes. No significant joint effusion. Arterial vascular calcifications noted.  IMPRESSION: No acute osseous abnormality identified.  Electronically Signed   By: Ofilia Neas M.D.   On: 05/08/2022 12:32 DG Chest 2 View CLINICAL DATA:  Fall  EXAM: CHEST - 2 VIEW  COMPARISON:  Chest x-ray 05/13/2019  FINDINGS: Heart is enlarged. Calcified plaques in the aortic arch. Mild opacities at the lung bases likely represent subsegmental atelectasis. No definite pleural effusion or pneumothorax identified.  IMPRESSION: Cardiomegaly and bibasilar subsegmental  atelectasis.  Electronically Signed   By: Ofilia Neas M.D.   On: 05/08/2022 12:30 CT Cervical Spine Wo Contrast CLINICAL DATA:  Head trauma, minor (Age >= 65y); Neck trauma (Age >= 65y)  EXAM: CT HEAD WITHOUT CONTRAST  CT CERVICAL SPINE WITHOUT CONTRAST  TECHNIQUE: Multidetector CT imaging of the head and cervical spine was performed following the standard protocol without intravenous contrast. Multiplanar CT image reconstructions of the cervical spine were also generated.  RADIATION DOSE REDUCTION: This exam was performed according to the departmental dose-optimization program which includes automated exposure control, adjustment of the mA and/or kV according to patient size and/or use of iterative reconstruction technique.  COMPARISON:  05/13/2019  FINDINGS: CT HEAD FINDINGS  Brain: No evidence of acute infarction, hemorrhage, hydrocephalus, extra-axial collection or mass lesion/mass effect. Extensive low-density changes within the periventricular and subcortical white matter compatible with chronic microvascular ischemic change. Moderate diffuse cerebral volume loss.  Vascular: Atherosclerotic calcifications  involving the large vessels of the skull base. No unexpected hyperdense vessel.  Skull: Normal. Negative for fracture or focal lesion.  Sinuses/Orbits: Mild paranasal sinus mucosal thickening. No air-fluid levels. Mastoid air cells are clear.  Other: Negative for scalp hematoma.  CT CERVICAL SPINE FINDINGS  Alignment: Facet joints are aligned without dislocation or traumatic listhesis. Dens and lateral masses are aligned.  Skull base and vertebrae: No acute fracture. No primary bone lesion or focal pathologic process.  Soft tissues and spinal canal: No prevertebral fluid or swelling. No visible canal hematoma.  Disc levels: Advanced degenerative disc disease and facet arthropathy throughout the cervical spine.  Upper chest: Negative.  Other:  Well-circumscribed 2.3 cm ovoid cystic lesion within the superficial subcutaneous soft tissues of the posterior right neck at approximately the C4 level, likely a sebaceous or epidermoid cyst.  IMPRESSION: 1. No acute intracranial abnormality. 2. No acute fracture or subluxation of the cervical spine. 3. Advanced chronic microvascular ischemic change and cerebral volume loss. 4. Advanced degenerative disc disease and facet arthropathy throughout the cervical spine.  Electronically Signed   By: Davina Poke D.O.   On: 05/08/2022 12:06 CT Head Wo Contrast CLINICAL DATA:  Head trauma, minor (Age >= 65y); Neck trauma (Age >= 65y)  EXAM: CT HEAD WITHOUT CONTRAST  CT CERVICAL SPINE WITHOUT CONTRAST  TECHNIQUE: Multidetector CT imaging of the head and cervical spine was performed following the standard protocol without intravenous contrast. Multiplanar CT image reconstructions of the cervical spine were also generated.  RADIATION DOSE REDUCTION: This exam was performed according to the departmental dose-optimization program which includes automated exposure control, adjustment of the mA and/or kV according to patient size and/or use of iterative reconstruction technique.  COMPARISON:  05/13/2019  FINDINGS: CT HEAD FINDINGS  Brain: No evidence of acute infarction, hemorrhage, hydrocephalus, extra-axial collection or mass lesion/mass effect. Extensive low-density changes within the periventricular and subcortical white matter compatible with chronic microvascular ischemic change. Moderate diffuse cerebral volume loss.  Vascular: Atherosclerotic calcifications involving the large vessels of the skull base. No unexpected hyperdense vessel.  Skull: Normal. Negative for fracture or focal lesion.  Sinuses/Orbits: Mild paranasal sinus mucosal thickening. No air-fluid levels. Mastoid air cells are clear.  Other: Negative for scalp hematoma.  CT CERVICAL SPINE  FINDINGS  Alignment: Facet joints are aligned without dislocation or traumatic listhesis. Dens and lateral masses are aligned.  Skull base and vertebrae: No acute fracture. No primary bone lesion or focal pathologic process.  Soft tissues and spinal canal: No prevertebral fluid or swelling. No visible canal hematoma.  Disc levels: Advanced degenerative disc disease and facet arthropathy throughout the cervical spine.  Upper chest: Negative.  Other: Well-circumscribed 2.3 cm ovoid cystic lesion within the superficial subcutaneous soft tissues of the posterior right neck at approximately the C4 level, likely a sebaceous or epidermoid cyst.  IMPRESSION: 1. No acute intracranial abnormality. 2. No acute fracture or subluxation of the cervical spine. 3. Advanced chronic microvascular ischemic change and cerebral volume loss. 4. Advanced degenerative disc disease and facet arthropathy throughout the cervical spine.  Electronically Signed   By: Davina Poke D.O.   On: 05/08/2022 12:06   Lab Results  Component Value Date   WBC 7.6 05/08/2022   HGB 10.2 (L) 05/08/2022   HCT 30.9 (L) 05/08/2022   MCV 95.1 05/08/2022   PLT 261 05/08/2022    05/15/19  05/16/19  05/08/22   Creatinine 1.64 (H) 1.62 (H) 1.67 (H)   Lab Results  Component Value  Date   ALT 13 05/08/2022   AST 15 05/08/2022   ALKPHOS 50 05/08/2022   BILITOT 0.6 05/08/2022    Assessment and Plan: 87 year old male with PMH of uncontrolled hypertension (non-adherent with home medications) who presented to the ED with altered mental status s/p unwitnessed fall found to have elevated BP >220/180 mmHg.  Of note, in 2020 patient was admitted with stroke like symptoms and elevated BP found to have negative neuro workup.  Acute Metabolic Encephalopathy This is likely due to uncontrolled blood pressure in the setting of poor cerebral reserve and possible development of dementia.   - Start home blood pressure  medications. Monitor blood pressure and mental status. - Infections can present atypically in geriatric population.  Urinalysis shows no evidence of infection. Chest Xray does not show any evidence of pneumonia.  Check blood cultures. - Head CT showed no acute abnormality.  2020 MRI bring imaging and EEG results were reviewed.  Hold off on further brain imaging.  Perform Neurochecks q4hrs.   S/P Fall: Given neuro exam and tremors, patient likely has poor gait balance at baseline. - Given history of bradycardia, monitor on telemetry x 24 hours.   - Given cardiac history, 2/6 systolic murmur on exam, and unexplained fall, check BNP and Echo. - Given tremors and weakness, check TSH. - Consult PT/OT in am.  Acute on Chronic Hypertension: Given age group and kidney disease- patient likely has renal vascular hypertension. - Restart home medications Losartan 100 mg daily and Amlodipine 5 mg daily.  Hold off on increasing the dose due to patient's known medication noncompliance. - Hold hydrochlorothiazide to avoid dehydration. - Avoid Beta Blocker due to patient's history of bradycardia. - Give IV Hydralazine 10 mg q4hrs PRN for SBP > 180 mmHg.  Goal blood pressure for tonight is ~ 160 mmHg.  In response to coding query - BNP was ordered to evaluate for hypertensive heart disease with heart failure.  It was also ordered to evaluate for a cardiac etiology of his unexplained fall.    Atrial Fibrillation: EKG shows Afib.  This is a new diagnosis. - Check Echo as above. - Discuss risks and benefits of anticoagulation with patient's family.  Patient is a high fall risk at this time.  Hyperlipidemia - Statin.  Coronary Artery Disease: - Aspirin and Statin.  Chronic Kidney Disease Stage 3: - Creatinine is at baseline. - Avoid NSAIDs.  BPH: 2020 CT showed enlarged prostate. - Bladder Scan is 99 Ccs. - Recommend Flomax and follow up with Urology outpatient.  Advanced Degenerative Disc  Disease: Physical Deconditioning: Advanced Aortic Atherosclerotic Disease: Aspirin and Statin Colonic Diverticulosis   Advance Care Planning:   Code Status: Full Code   Consults: None  Family Communication: Communicated with family at bedside.  Severity of Illness: The appropriate patient status for this patient is INPATIENT. Inpatient status is judged to be reasonable and necessary in order to provide the required intensity of service to ensure the patient's safety. The patient's presenting symptoms, physical exam findings, and initial radiographic and laboratory data in the context of their chronic comorbidities is felt to place them at high risk for further clinical deterioration. Furthermore, it is not anticipated that the patient will be medically stable for discharge from the hospital within 2 midnights of admission.   * I certify that at the point of admission it is my clinical judgment that the patient will require inpatient hospital care spanning beyond 2 midnights from the point of admission due  to high intensity of service, high risk for further deterioration and high frequency of surveillance required.*  Author: George Hugh, MD 05/08/2022 1:38 PM  For on call review [email or web address].

## 2022-05-08 NOTE — Progress Notes (Signed)
*  PRELIMINARY RESULTS* Echocardiogram 2D Echocardiogram has been performed.  [name] 05/08/2022, 4:12 PM

## 2022-05-08 NOTE — ED Notes (Signed)
Pt in CT.

## 2022-05-08 NOTE — ED Notes (Signed)
Pt back from ct and x ray,

## 2022-05-08 NOTE — Assessment & Plan Note (Signed)
Consult PT/OT in am.

## 2022-05-08 NOTE — ED Notes (Signed)
RN aware bed assigned ?

## 2022-05-08 NOTE — Assessment & Plan Note (Signed)
This is likely due to uncontrolled blood pressure in the setting of poor cerebral reserve and possible early dementia.   - Restart home blood pressure medications. - Urinalysis shows no evidence of infection. Chest Xray shows no evidence of pneumonia.  Check blood cultures as infections in geriatric patients can present atypically. - Follow up with Neurologist outpatient.

## 2022-05-08 NOTE — ED Provider Notes (Signed)
[facility] Provider Note    Event Date/Time   First MD Initiated Contact with Patient 05/08/22 1120     (approximate)   History   Chief Complaint Fall   HPI  Charles Sims is a 88 y.o. male with past medical history of hypertension, CAD, CKD who presents to the ED for fall and altered mental status.  History is limited due to patient's confusion and majority of history is obtained from EMS.  Patient was reportedly found down at home by family and EMS was called, EMS is unsure how long he was down on the ground for.  They did notice blood along the right side of his face and patient reports pain near his right upper lip.  He is unsure exactly what happened, states he does not remember falling.  He denies any headache or neck pain, denies any pain in his extremities, chest, or abdomen.  He does not currently take any medications per EMS and they state he has not been seen by a PCP in about 3 years.  Patient currently states he is not sure where he is or why he is here.     Physical Exam   Triage Vital Signs: ED Triage Vitals [05/08/22 1125]  Enc Vitals Group     BP (!) 238/101     Pulse Rate 68     Resp 18     Temp 97.9 F (36.6 C)     Temp Source Oral     SpO2      Weight      Height      Head Circumference      Peak Flow      Pain Score      Pain Loc      Pain Edu?      Excl. in [place]?     Most recent vital signs: Vitals:   05/08/22 1300 05/08/22 1333  BP: (!) 179/93 (!) 155/105  Pulse: (!) 106 70  Resp:  17  Temp:  97.7 F (36.5 C)  SpO2: (!) 89% 94%    Constitutional: Alert and oriented to person, but not place, time, or situation. Eyes: Conjunctivae are normal.  Pupils equal, round, and reactive to light bilaterally. Head: Less than 1 cm laceration to right upper lip that travels through to anterior lip. Nose: No congestion/rhinnorhea. Mouth/Throat: Mucous membranes are moist.  Neck: No midline cervical spine tenderness to  palpation. Cardiovascular: Normal rate, regular rhythm. Grossly normal heart sounds.  2+ radial pulses bilaterally. Respiratory: Normal respiratory effort.  No retractions. Lungs CTAB. Gastrointestinal: Soft and nontender. No distention. Musculoskeletal: Tenderness to palpation noted over right anterior knee with no obvious deformity.  No tenderness noted at bilateral hips, left knee, bilateral ankles or feet.  No upper extremity bony tenderness to palpation noted. Neurologic:  Normal speech and language. No gross focal neurologic deficits are appreciated.    ED Results / Procedures / Treatments   Labs (all labs ordered are listed, but only abnormal results are displayed) Labs Reviewed  CBC WITH DIFFERENTIAL/PLATELET - Abnormal; Notable for the following components:      Result Value   RBC 3.25 (*)    Hemoglobin 10.2 (*)    HCT 30.9 (*)    All other components within normal limits  COMPREHENSIVE METABOLIC PANEL - Abnormal; Notable for the following components:   Creatinine, Ser 1.67 (*)    Calcium 8.7 (*)    Total Protein 6.4 (*)    GFR,  Estimated 39 (*)    All other components within normal limits  URINALYSIS, ROUTINE W REFLEX MICROSCOPIC - Abnormal; Notable for the following components:   Color, Urine COLORLESS (*)    APPearance CLEAR (*)    Hgb urine dipstick SMALL (*)    Protein, ur 100 (*)    All other components within normal limits  TROPONIN I (HIGH SENSITIVITY) - Abnormal; Notable for the following components:   Troponin I (High Sensitivity) 34 (*)    All other components within normal limits  CULTURE, BLOOD (ROUTINE X 2)  CULTURE, BLOOD (ROUTINE X 2)  MAGNESIUM  CK  BRAIN NATRIURETIC PEPTIDE  TSH  LACTIC ACID, PLASMA  TROPONIN I (HIGH SENSITIVITY)     EKG  ED ECG REPORT I, Blake Divine, the attending physician, personally viewed and interpreted this ECG.   Date: 05/08/2022  EKG Time: 11:26  Rate: 63  Rhythm: normal sinus rhythm  Axis: Normal   Intervals:first-degree A-V block  and borderline prolonged QT  ST&T Change: None  RADIOLOGY CT head reviewed and interpreted by me with no hemorrhage or midline shift.  PROCEDURES:  Critical Care performed: No  ..Laceration Repair  Date/Time: 05/08/2022 2:02 PM  Performed by: Blake Divine, MD Authorized by: Blake Divine, MD   Consent:    Consent obtained:  Verbal   Consent given by:  Patient   Risks, benefits, and alternatives were discussed: yes   Universal protocol:    Patient identity confirmed:  Verbally with patient and arm band Anesthesia:    Anesthesia method:  Local infiltration   Local anesthetic:  Lidocaine 1% w/o epi Laceration details:    Location:  Lip   Lip location:  Upper lip, full thickness   Vermilion border involved: yes     Length (cm):  1 Pre-procedure details:    Preparation:  Patient was prepped and draped in usual sterile fashion and imaging obtained to evaluate for foreign bodies Exploration:    Wound exploration: wound explored through full range of motion and entire depth of wound visualized     Wound extent: no areolar tissue violation noted, no fascia violation noted, no foreign bodies/material noted, no muscle damage noted, no nerve damage noted, no tendon damage noted, no underlying fracture noted and no vascular damage noted     Contaminated: no   Treatment:    Area cleansed with:  Saline   Amount of cleaning:  Standard   Irrigation solution:  Sterile saline   Irrigation method:  Syringe   Visualized foreign bodies/material removed: no     Debridement:  None   Undermining:  None   Scar revision: no   Skin repair:    Repair method:  Sutures   Suture size:  5-0   Suture material:  Nylon and fast-absorbing gut   Suture technique:  Simple interrupted   Number of sutures:  2 Approximation:    Approximation:  Loose   Vermilion border well-aligned: yes   Repair type:    Repair type:  Simple Post-procedure details:    Dressing:   Open (no dressing)   Procedure completion:  Tolerated well, no immediate complications    MEDICATIONS ORDERED IN ED: Medications  enoxaparin (LOVENOX) injection 40 mg (has no administration in time range)  acetaminophen (TYLENOL) tablet 650 mg (has no administration in time range)    Or  acetaminophen (TYLENOL) suppository 650 mg (has no administration in time range)  senna-docusate (Senokot-S) tablet 1 tablet (has no administration in time range)  ondansetron (ZOFRAN)  tablet 4 mg (has no administration in time range)    Or  ondansetron (ZOFRAN) injection 4 mg (has no administration in time range)  lidocaine (PF) (XYLOCAINE) 1 % injection 5 mL (5 mLs Infiltration Given by Other 05/08/22 1147)  labetalol (NORMODYNE) injection 10 mg (10 mg Intravenous Given 05/08/22 1241)  amLODipine (NORVASC) tablet 5 mg (5 mg Oral Given 05/08/22 1243)  losartan (COZAAR) tablet 100 mg (100 mg Oral Given 05/08/22 1243)  hydrochlorothiazide (HYDRODIURIL) tablet 12.5 mg (12.5 mg Oral Given 05/08/22 1243)     IMPRESSION / MDM / ASSESSMENT AND PLAN / ED COURSE  I reviewed the triage vital signs and the nursing notes.                              88 y.o. male with past medical history of hypertension, CAD, and CKD who presents to the ED for fall with unknown cause and altered mental status of unknown duration.  Patient's presentation is most consistent with acute presentation with potential threat to life or bodily function.  Differential diagnosis includes, but is not limited to, traumatic head injury, cervical spine injury, extremity injury, arrhythmia, ACS, dehydration, electrolyte abnormality, rhabdomyolysis, UTI, pneumonia.  Patient nontoxic-appearing and in no acute distress, vitals remarkable for elevated blood pressure however this is likely chronic as patient has not been on blood pressure medications for multiple years.  He appears confused but with no focal neurologic deficits on exam, will check CT head  and cervical spine for traumatic injury or other acute process.  Patient does appear altered at this time, noted to be confused during admission 3 years ago with unclear etiology, however improved at the time of discharge and seems to be different from his baseline today.  We will check labs for potential etiology, also screen chest x-ray and UA.  CT head and cervical spine are negative for acute traumatic injury.  Labs are also reassuring with no AKI on top of patient's chronic kidney disease, no significant electrolyte abnormality, anemia, leukocytosis, or CK level elevation.  Patient does have mildly elevated troponin but comparable to previous given his chronic kidney disease.  Lip laceration was repaired, patient does remain altered from his baseline and hypertension may be playing a role.  He was given IV dose of labetalol and restarted on his home medications with appropriate response.  Case discussed with hospitalist for admission.      FINAL CLINICAL IMPRESSION(S) / ED DIAGNOSES   Final diagnoses:  Fall, initial encounter  Altered mental status, unspecified altered mental status type  Lip laceration, initial encounter     Rx / DC Orders   ED Discharge Orders     None        Note:  This document was prepared using Dragon voice recognition software and may include unintentional dictation errors.   Blake Divine, MD 05/08/22 [phone]

## 2022-05-08 NOTE — ED Triage Notes (Signed)
Pt to er room number 15 via ems, per ems pt was found down by ems, pt states that he doesn't know what happened to him, pt has dried blood on face and L hand, denies sob or chest pain.  Pt awake and oriented to self, doesn't know time or place, reoriented pt.  Pt has lac to lip

## 2022-05-08 NOTE — Assessment & Plan Note (Signed)
-   Restart home medications.  Hold hydrochlorothiazide and encourage fluid intake. - Give IV Labetalol 10 mg q4hrs PRN for SBP > 180 mmHg.

## 2022-05-09 DIAGNOSIS — G9341 Metabolic encephalopathy: Secondary | ICD-10-CM | POA: Diagnosis not present

## 2022-05-09 LAB — CBC
HCT: 29.4 % — ABNORMAL LOW (ref 39.0–52.0)
Hemoglobin: 9.8 g/dL — ABNORMAL LOW (ref 13.0–17.0)
MCH: 31.1 pg (ref 26.0–34.0)
MCHC: 33.3 g/dL (ref 30.0–36.0)
MCV: 93.3 fL (ref 80.0–100.0)
Platelets: 261 10*3/uL (ref 150–400)
RBC: 3.15 MIL/uL — ABNORMAL LOW (ref 4.22–5.81)
RDW: 13.7 % (ref 11.5–15.5)
WBC: 7.8 10*3/uL (ref 4.0–10.5)
nRBC: 0 % (ref 0.0–0.2)

## 2022-05-09 LAB — BASIC METABOLIC PANEL
Anion gap: 6 (ref 5–15)
BUN: 21 mg/dL (ref 8–23)
CO2: 26 mmol/L (ref 22–32)
Calcium: 8.9 mg/dL (ref 8.9–10.3)
Chloride: 107 mmol/L (ref 98–111)
Creatinine, Ser: 1.68 mg/dL — ABNORMAL HIGH (ref 0.61–1.24)
GFR, Estimated: 38 mL/min — ABNORMAL LOW (ref >60)
Glucose, Bld: 106 mg/dL — ABNORMAL HIGH (ref 70–99)
Potassium: 3.5 mmol/L (ref 3.5–5.1)
Sodium: 139 mmol/L (ref 135–145)

## 2022-05-09 MED ORDER — MELATONIN 5 MG PO TABS
5.0000 mg | ORAL_TABLET | Freq: Once | ORAL | Status: AC
Start: 2022-05-09 — End: 2022-05-09
  Administered 2022-05-09: 5 mg via ORAL
  Filled 2022-05-09: qty 1

## 2022-05-09 MED ORDER — AMLODIPINE BESYLATE 5 MG PO TABS: 5.0000 mg | ORAL_TABLET | Freq: Every day | ORAL | 2 refills | Status: DC

## 2022-05-09 MED ORDER — HYDROCHLOROTHIAZIDE 12.5 MG PO TABS
12.5000 mg | ORAL_TABLET | Freq: Every day | ORAL | Status: DC
  Administered 2022-05-09: 12.5 mg via ORAL
  Filled 2022-05-09: qty 1

## 2022-05-09 MED ORDER — AMLODIPINE BESYLATE 5 MG PO TABS
5.0000 mg | ORAL_TABLET | Freq: Every day | ORAL | Status: DC
  Administered 2022-05-09: 5 mg via ORAL
  Filled 2022-05-09: qty 1

## 2022-05-09 MED ORDER — LOSARTAN POTASSIUM-HCTZ 100-12.5 MG PO TABS: 1.0000 | ORAL_TABLET | Freq: Every day | ORAL | 2 refills | Status: DC

## 2022-05-09 NOTE — TOC Initial Note (Signed)
Transition of Care [facility]) - Initial/Assessment Note    Patient Details  Name: Charles Sims MRN: [id number] Date of Birth: 1930/02/02  [facility]) CM/SW Contact:    Beverly Sessions, RN Phone Number: 05/09/2022, 2:21 PM  Clinical Narrative:                 Admitted for: Encephalopathy  Admitted from: Home with wife RSW:NIOEVO  Pharmacy:[facility]. Denies issues obtaining medications Current home health/prior home health/DME: RW  Son at bedside and will transport at discharge Code 69 complete  Son in agreement for home health services and states he does not have a preference of home health agency.  Referral made and accepted by Jenny Reichmann with [facility]            Patient Goals and CMS Choice        Expected Discharge Plan and Services           Expected Discharge Date: 05/09/22                                    Prior Living Arrangements/Services                       Activities of Daily Living Home Assistive Devices/Equipment: Other (Comment) (UTA/PT CONFUSED) ADL Screening (condition at time of admission) Patient's cognitive ability adequate to safely complete daily activities?: Yes Is the patient deaf or have difficulty hearing?: No Does the patient have difficulty seeing, even when wearing glasses/contacts?: No Does the patient have difficulty concentrating, remembering, or making decisions?: Yes Patient able to express need for assistance with ADLs?: Yes Does the patient have difficulty dressing or bathing?: No Independently performs ADLs?: Yes (appropriate for developmental age) Does the patient have difficulty walking or climbing stairs?: Yes Weakness of Legs: Both Weakness of Arms/Hands: None  Permission Sought/Granted                  Emotional Assessment              Admission diagnosis:  Acute encephalopathy [G93.40] Lip laceration, initial encounter [S01.511A] Fall, initial encounter [W19.XXXA] Altered mental  status, unspecified altered mental status type [J50.09] Acute metabolic encephalopathy [F81.82] Patient Active Problem List   Diagnosis Date Noted   Syncope 05/08/2022   Physical deconditioning 99/37/1696   Acute metabolic encephalopathy 78/93/8101   Community acquired pneumonia of left lower lobe of lung 05/14/2019   Abdominal tenderness, left lower quadrant 05/14/2019   Bradycardia, sinus 02/13/2014   Chest pain 02/13/2014   Dizziness 02/13/2014   Breathlessness on exertion 02/13/2014   Essential hypertension 02/13/2014   Carotid stenosis 02/13/2014   Benign prostatic hyperplasia with urinary obstruction 11/03/2012   Chronic prostatitis 11/03/2012   Abnormal prostate specific antigen 11/03/2012   Prostatitis 11/03/2012   Urgency of micturation 11/03/2012   FOM (frequency of micturition) 11/03/2012   CAD S/P percutaneous coronary angioplasty 06/04/2012   PCP:  [name], MD Pharmacy:   [facility] [address], [place] - [place] [address] [place] [address] Phone: [phone] Fax: [facility] [place] [place] [address] Phone: [phone] Fax: [phone]     Social Determinants of Health (SDOH) Interventions    Readmission Risk Interventions     No data to display

## 2022-05-09 NOTE — Evaluation (Signed)
Physical Therapy Evaluation Patient Details Name: Charles Sims MRN: [id number] DOB: 05-06-1930 Today's Date: 05/09/2022  History of Present Illness  Charles Sims is a 88 y.o. male with medical history significant of Chronic Hypertension, Hyperlipidemia, Coronary Artery Disease (last LHC in 2013 showed nonobstructive disease), history of bradycardia, and Chronic Kidney Disease Stage 3 who presented to the ED with confusion after sustaining a fall at home.  Patient was found down at home by his family and EMS was called.  The fall was unwitnessed and patient does not recall the details of how he fell.  There was blood and a small laceration noted on the right side of his face that was sutured by ED physician.  Head CT and CT of the cervical spine did not show any acute abnormality.   Clinical Impression  Patient received in bed, NT present in room for toileting. Patient agrees to get up to [facility]. He performed bed mobility with supervision. Stands with min A. He is able to take a few pivoting steps to [facility] and then back to bed. Able to side step about one step along edge of bed with min A. Returned to supine without A. He will continue to benefit from skilled PT to improve strength, functional independence and safety.         Recommendations for follow up therapy are one component of a multi-disciplinary discharge planning process, led by the attending physician.  Recommendations may be updated based on patient status, additional functional criteria and insurance authorization.  Follow Up Recommendations Home health PT Can patient physically be transported by private vehicle: Yes    Assistance Recommended at Discharge Frequent or constant Supervision/Assistance  Patient can return home with the following  A little help with walking and/or transfers;A little help with bathing/dressing/bathroom;Assist for transportation;Help with stairs or ramp for entrance;Assistance with cooking/housework;Direct  supervision/assist for medications management;Direct supervision/assist for financial management    Equipment Recommendations None recommended by PT;Other (comment) (son reports patient has walker at home)  Recommendations for Other Services       Functional Status Assessment Patient has had a recent decline in their functional status and demonstrates the ability to make significant improvements in function in a reasonable and predictable amount of time.     Precautions / Restrictions Precautions Precautions: Fall Precaution Comments: sitter in room Restrictions Weight Bearing Restrictions: No      Mobility  Bed Mobility Overal bed mobility: Modified Independent                  Transfers Overall transfer level: Needs assistance Equipment used: 1 person hand held assist Transfers: Sit to/from Stand, Bed to chair/wheelchair/BSC Sit to Stand: Min assist Stand pivot transfers: Min assist              Ambulation/Gait               General Gait Details: deferred  Stairs            Wheelchair Mobility    Modified Rankin (Stroke Patients Only)       Balance Overall balance assessment: Needs assistance, History of Falls Sitting-balance support: Feet supported Sitting balance-Leahy Scale: Fair     Standing balance support: Bilateral upper extremity supported, During functional activity, Reliant on assistive device for balance Standing balance-Leahy Scale: Poor Standing balance comment: Will benefit from RW. Had B UE support for transfers at this time  Pertinent Vitals/Pain Pain Assessment Pain Assessment: No/denies pain    Home Living Family/patient expects to be discharged to:: Private residence Living Arrangements: Spouse/significant other Available Help at Discharge: Family;Available 24 hours/day Type of Home: House Home Access: Ramped entrance       Home Layout: One level Home Equipment:  Conservation officer, nature (2 wheels)      Prior Function Prior Level of Function : Independent/Modified Independent             Mobility Comments: Son reports patient walked without AD prior to admission. Has walker, but does not use ADLs Comments: min A- daughter or son are with him and his wife during the day. They are alone at night. Family can stay at night if needed. Wife walks with walker and has W/C     Hand Dominance   Dominant Hand: Right    Extremity/Trunk Assessment   Upper Extremity Assessment Upper Extremity Assessment: Defer to OT evaluation    Lower Extremity Assessment Lower Extremity Assessment: Generalized weakness    Cervical / Trunk Assessment Cervical / Trunk Assessment: Normal  Communication   Communication: No difficulties  Cognition Arousal/Alertness: Lethargic Behavior During Therapy: Flat affect Overall Cognitive Status: Difficult to assess                                 General Comments: patient with min verbalizations, but pleasant        General Comments      Exercises     Assessment/Plan    PT Assessment Patient needs continued PT services  PT Problem List Decreased strength;Decreased mobility;Decreased activity tolerance;Decreased balance;Decreased knowledge of use of DME;Decreased safety awareness;Decreased skin integrity       PT Treatment Interventions DME instruction;Therapeutic exercise;Gait training;Balance training;Stair training;Functional mobility training;Therapeutic activities;Patient/family education    PT Goals (Current goals can be found in the Care Plan section)  Acute Rehab PT Goals Patient Stated Goal: to return home with family assist PT Goal Formulation: With family Potential to Achieve Goals: Good    Frequency Min 2X/week     Co-evaluation               AM-PAC PT "6 Clicks" Mobility  Outcome Measure Help needed turning from your back to your side while in a flat bed without using  bedrails?: A Little Help needed moving from lying on your back to sitting on the side of a flat bed without using bedrails?: A Little Help needed moving to and from a bed to a chair (including a wheelchair)?: A Little Help needed standing up from a chair using your arms (e.g., wheelchair or bedside chair)?: A Little Help needed to walk in hospital room?: A Lot Help needed climbing 3-5 steps with a railing? : Total 6 Click Score: 15    End of Session Equipment Utilized During Treatment: Gait belt Activity Tolerance: Patient limited by lethargy Patient left: in bed;with call bell/phone within reach;with bed alarm set;with nursing/sitter in room Nurse Communication: Mobility status PT Visit Diagnosis: Unsteadiness on feet (R26.81);Difficulty in walking, not elsewhere classified (R26.2);Muscle weakness (generalized) (M62.81);History of falling (Z91.81)    Time: 0814-4818 PT Time Calculation (min) (ACUTE ONLY): 18 min   Charges:   PT Evaluation $PT Eval Moderate Complexity: 1 Mod          Juliahna Wiswell, PT, GCS 05/09/22,10:43 AM

## 2022-05-09 NOTE — Care Management CC44 (Signed)
Condition Code 44 Documentation Completed  Patient Details  Name: Charles Sims MRN: [id number] Date of Birth: February 10, 1930   Condition Code 44 given:  Yes Patient signature on Condition Code 44 notice:  Yes Documentation of 2 MD's agreement:  Yes Code 44 added to claim:  Yes    Beverly Sessions, RN 05/09/2022, 2:14 PM

## 2022-05-09 NOTE — Care Management Obs Status (Signed)
[facility] NOTIFICATION   Patient Details  Name: KLEVER TWYFORD MRN: [id number] Date of Birth: 1930-03-03   Medicare Observation Status Notification Given:  Yes    Beverly Sessions, RN 05/09/2022, 2:14 PM

## 2022-05-09 NOTE — Progress Notes (Signed)
       CROSS COVER NOTE  NAME: Charles Sims MRN: [id number] DOB : 01-Jun-1930    Date of Service   05/09/22  HPI/Events of Note   Secure chat received from nursing "Patient had a fall at home and was admitted. Since admission, patient has been very confused, getting out of bed constantly, has pulled out an IV and is not easily redirected. No staff to sit with patient. Can I have anything to help him relax? He had nothing on his list for sleep"  Charles Sims is a 88 yo M with PMH of HTN and medication noncompliance who presented to [name] ED with altered mental status after an unwitnessed fall at home. Also, noted to have prolonged QTc on ED EKG.    Interventions   Plan: Acute Metabolic Encephalopathy       - Melatonin       - Safety Sitter       - Delirium precautions       - Fall precautions           This document was prepared using Set designer software and may include unintentional dictation errors.  Neomia Glass DNP, MHA, FNP-BC Nurse Practitioner [facility] Pager [phone]

## 2022-05-09 NOTE — Evaluation (Signed)
Occupational Therapy Evaluation Patient Details Name: Charles Sims MRN: [id number] DOB: 08-Dec-1929 Today's Date: 05/09/2022   History of Present Illness Charles Sims is a 88 y.o. male with medical history significant of Chronic Hypertension, Hyperlipidemia, Coronary Artery Disease (last LHC in 2013 showed nonobstructive disease), history of bradycardia, and Chronic Kidney Disease Stage 3 who presented to the ED with confusion after sustaining a fall at home.  Patient was found down at home by his family and EMS was called.  The fall was unwitnessed and patient does not recall the details of how he fell.  There was blood and a small laceration noted on the right side of his face that was sutured by ED physician.  Head CT and CT of the cervical spine did not show any acute abnormality.   Clinical Impression   Pt was seen for OT evaluation this date. Prior to hospital admission, pt was independent with mobility and needs some assistance with ADLs. Pt lives with spouse in one level house with ramped entrance, family available 24/7 for assistance if needed. Pt presents to acute OT demonstrating impaired ADL performance and functional mobility 2/2 functional strength/ROM/balance deficits. Pt currently requires MOD I for supine>sit and MIN A for STS. MOD I for donning/doffing socks while seated. CGA + RW with min vcs for RW management and sequencing for ADL t/f - progressing to CGA with no AD, but occasionally reaching for bed rail and other surfaces for support, pt family reported this is baseline. CGA for grooming tasks at sink in standing. All questions from pt and family answered within scope of practice. Pt left in chair with all needs met and family present. Pt family agrees pt is near baseline. All education completed, will sign off. Upon hospital discharge, recommend HHOT to maximize pt safety, fall prevention, and return to PLOF.      Recommendations for follow up therapy are one component of a  multi-disciplinary discharge planning process, led by the attending physician.  Recommendations may be updated based on patient status, additional functional criteria and insurance authorization.   Follow Up Recommendations  Home health OT    Assistance Recommended at Discharge Intermittent Supervision/Assistance  Patient can return home with the following A little help with walking and/or transfers;A little help with bathing/dressing/bathroom;Assistance with cooking/housework;Direct supervision/assist for medications management;Assist for transportation;Help with stairs or ramp for entrance    Functional Status Assessment  Patient has had a recent decline in their functional status and demonstrates the ability to make significant improvements in function in a reasonable and predictable amount of time.  Equipment Recommendations  Other (comment) (per next venue of care)    Recommendations for Other Services       Precautions / Restrictions Precautions Precautions: Fall Restrictions Weight Bearing Restrictions: No      Mobility Bed Mobility Overal bed mobility: Modified Independent                  Transfers Overall transfer level: Needs assistance Equipment used: None Transfers: Sit to/from Stand Sit to Stand: Min assist                  Balance Overall balance assessment: History of Falls, Needs assistance Sitting-balance support: Feet supported, No upper extremity supported Sitting balance-Leahy Scale: Fair     Standing balance support: No upper extremity supported, During functional activity Standing balance-Leahy Scale: Fair  ADL either performed or assessed with clinical judgement   ADL Overall ADL's : Needs assistance/impaired                                       General ADL Comments: MOD I for donning/doffing socks while seated. CGA + RW with min vcs for RW management and sequencing for ADL  t/f - progressing to CGA with no AD, but occasionally reaching for bed rail and other surfaces for support, pt family report this is baseline. CGA for grooming tasks at sink in standing.      Extremity/Trunk Assessment Upper Extremity Assessment Upper Extremity Assessment: Generalized weakness (observed tremors in hands, pt and family reports that is baseline)   Lower Extremity Assessment Lower Extremity Assessment: Generalized weakness   Cervical / Trunk Assessment Cervical / Trunk Assessment: Normal   Communication Communication Communication: No difficulties   Cognition Arousal/Alertness: Awake/alert Behavior During Therapy: WFL for tasks assessed/performed Overall Cognitive Status: Impaired/Different from baseline Area of Impairment: Orientation, Following commands, Safety/judgement                 Orientation Level: Disoriented to, Person (pt reported he could not remember how old he was)     Following Commands: Follows one step commands consistently Safety/Judgement: Decreased awareness of safety, Decreased awareness of deficits                      Home Living Family/patient expects to be discharged to:: Private residence Living Arrangements: Spouse/significant other Available Help at Discharge: Family;Available 24 hours/day Type of Home: House Home Access: Ramped entrance     Home Layout: One level               Home Equipment: Conservation officer, nature (2 wheels)          Prior Functioning/Environment Prior Level of Function : Independent/Modified Independent             Mobility Comments: Son reports patient walked without AD prior to admission. Has walker, but does not use ADLs Comments: min A- daughter or son are with him and his wife during the day. They are alone at night. Family can stay at night if needed. Wife walks with walker and has W/C        OT Problem List: Decreased strength;Decreased range of motion;Impaired balance (sitting  and/or standing);Decreased activity tolerance;Decreased safety awareness;Decreased cognition      OT Treatment/Interventions:      OT Goals(Current goals can be found in the care plan section) Acute Rehab OT Goals Patient Stated Goal: to go home OT Goal Formulation: With patient/family Time For Goal Achievement: 05/23/22 Potential to Achieve Goals: Good  OT Frequency:         AM-PAC OT "6 Clicks" Daily Activity     Outcome Measure Help from another person eating meals?: None Help from another person taking care of personal grooming?: A Little Help from another person toileting, which includes using toliet, bedpan, or urinal?: A Little Help from another person bathing (including washing, rinsing, drying)?: A Little Help from another person to put on and taking off regular upper body clothing?: A Little Help from another person to put on and taking off regular lower body clothing?: A Little 6 Click Score: 19   End of Session Equipment Utilized During Treatment: Gait belt;Rolling walker (2 wheels)  Activity Tolerance: Patient tolerated treatment well Patient left: in  chair;with call bell/phone within reach;with family/visitor present  OT Visit Diagnosis: Unsteadiness on feet (R26.81)                Time: 4248-1443 OT Time Calculation (min): 17 min Charges:     [employer] OTDS  Charles Sims 05/09/2022, 2:35 PM

## 2022-05-09 NOTE — Discharge Summary (Signed)
Physician Discharge Summary   Charles Sims  male DOB: 21-Nov-1929  HWT:[id number]  PCP: [name], MD  Admit date: 05/08/2022 Discharge date: 05/09/2022  Admitted From: home Disposition:  home Son updated at bedside prior to discharge. Home Health: Yes CODE STATUS: Full code  Discharge Instructions     No wound care   Complete by: As directed       Hospital Course:  For full details, please see H&P, progress notes, consult notes and ancillary notes.  Briefly,  Charles Sims is a 88 y.o. male with medical history significant of Hypertension, Coronary Artery Disease (last LHC in 2013 showed nonobstructive disease), and Chronic Kidney Disease who presented to the ED with confusion after sustaining a fall at home.   Patient was found down at home by his family and EMS was called.  The fall was unwitnessed and patient does not recall the details of how he fell.  There was blood and a small laceration noted on the right side of his face that was sutured by ED physician.  Head CT and CT of the cervical spine did not show any acute abnormality.     Upon arrival to the ED, patient's blood pressure was 238/101 mmHg.  He is prescribed Amlodipine 5 mg daily and Losartan-Hydrochlorothiazide 100-12.5 mg daily but non-compliant, and has not seen his primary care physician in three years.  Fall --PT/OT rec home with [facility]  Acute Metabolic Encephalopathy  Likely baseline dementia --This is likely due to uncontrolled blood pressure in the setting of poor cerebral reserve and possible development of dementia.  No evidence of infection. --Pt still had confusion and difficulty with speech since presentation (which could be due to difficulty with thought processing), which son and I believe should improve after pt returns to home environment.  Difficulty with speech --Pt was having difficulty finding words.  Son reported this was new.  MRI brain and stroke workup offered, however, pt wanted to go  home and son and pt declined further workup.   Uncontrolled Hypertension Medication non-compliance --BP improved after resuming home BP meds. --cont home amlodipine, and Losartan-Hydrochlorothiazide.   Atrial Fibrillation:  EKG shows Afib.  This is a new diagnosis. - Patient is a high fall risk at this time.  Will not start anticoagulation right now.   Hyperlipidemia - Statin.   Coronary Artery Disease: - Aspirin and Statin.   Chronic Kidney Disease Stage 3b: - Creatinine is at baseline.    Unless noted above, medications under "STOP" list are ones pt was not taking PTA.  Discharge Diagnoses:  Principal Problem:   Acute metabolic encephalopathy Active Problems:   Benign prostatic hyperplasia with urinary obstruction   Bradycardia, sinus   CAD S/P percutaneous coronary angioplasty   Essential hypertension   Syncope   Physical deconditioning   30 Day Unplanned Readmission Risk Score    Flowsheet Row ED to Hosp-Admission (Current) from 05/08/2022 in [place]  30 Day Unplanned Readmission Risk Score (%) 10.84 Filed at 05/09/2022 1200       This score is the patient's risk of an unplanned readmission within 30 days of being discharged (0 -100%). The score is based on dignosis, age, lab data, medications, orders, and past utilization.   Low:  0-14.9   Medium: 15-21.9   High: 22-29.9   Extreme: 30 and above         Discharge Instructions:  Allergies as of 05/09/2022  Reactions   Lisinopril Cough        Medication List     STOP taking these medications    acetaminophen 500 MG tablet Commonly known as: TYLENOL   diclofenac sodium 1 % Gel Commonly known as: VOLTAREN   loperamide 2 MG tablet Commonly known as: IMODIUM A-D   omeprazole 20 MG capsule Commonly known as: PRILOSEC   traMADol 50 MG tablet Commonly known as: ULTRAM       TAKE these medications    amLODipine 5 MG tablet Commonly known as:  NORVASC Take 1 tablet (5 mg total) by mouth daily.   aspirin EC 81 MG tablet Take 81 mg by mouth daily.   losartan-hydrochlorothiazide 100-12.5 MG tablet Commonly known as: HYZAAR Take 1 tablet by mouth daily.   simvastatin 20 MG tablet Commonly known as: Pump Back 1 TABLET EVERY DAY         Follow-up Information     [name], MD Follow up.   Specialties: Internal Medicine, Cardiology Contact information: [address] [place] [address] [phone]                 Allergies  Allergen Reactions   Lisinopril Cough     The results of significant diagnostics from this hospitalization (including imaging, microbiology, ancillary and laboratory) are listed below for reference.   Consultations:   Procedures/Studies: ECHOCARDIOGRAM COMPLETE  Result Date: 05/08/2022    ECHOCARDIOGRAM REPORT   Patient Name:   Charles Sims Date of Exam: 05/08/2022 Medical Rec #:  [id number]     Height:       70.0 in Accession #:    [id number]    Weight:       175.0 lb Date of Birth:  16-Dec-1929     BSA:          1.972 m Patient Age:    68 years      BP:           175/89 mmHg Patient Gender: M             HR:           74 bpm. Exam Location:  [facility] Procedure: 2D Echo Indications:     Syncope R55  History:         Patient has no prior history of Echocardiogram examinations.  Sonographer:     Kathlen Brunswick RDCS Referring Phys:  [id number] George Hugh Diagnosing Phys: Ida Rogue MD IMPRESSIONS  1. Left ventricular ejection fraction, by estimation, is 60 to 65%. The left ventricle has normal function. The left ventricle has no regional wall motion abnormalities. There is moderate left ventricular hypertrophy. Left ventricular diastolic parameters are indeterminate.  2. Right ventricular systolic function is normal. The right ventricular size is normal. Tricuspid regurgitation signal is inadequate for assessing PA pressure.  3. The mitral valve is normal in structure. Mild to moderate  mitral valve regurgitation. No evidence of mitral stenosis.  4. The aortic valve has an indeterminant number of cusps. Aortic valve regurgitation is mild. Aortic valve sclerosis is present, with no evidence of aortic valve stenosis.  5. The inferior vena cava is normal in size with greater than 50% respiratory variability, suggesting right atrial pressure of 3 mmHg. FINDINGS  Left Ventricle: Left ventricular ejection fraction, by estimation, is 60 to 65%. The left ventricle has normal function. The left ventricle has no regional wall motion abnormalities. The left ventricular internal cavity size was normal in size. There is  moderate  left ventricular hypertrophy. Left ventricular diastolic parameters are indeterminate. Right Ventricle: The right ventricular size is normal. No increase in right ventricular wall thickness. Right ventricular systolic function is normal. Tricuspid regurgitation signal is inadequate for assessing PA pressure. Left Atrium: Left atrial size was normal in size. Right Atrium: Right atrial size was normal in size. Pericardium: There is no evidence of pericardial effusion. Mitral Valve: The mitral valve is normal in structure. Mild to moderate mitral valve regurgitation. No evidence of mitral valve stenosis. Tricuspid Valve: The tricuspid valve is normal in structure. Tricuspid valve regurgitation is not demonstrated. No evidence of tricuspid stenosis. Aortic Valve: The aortic valve has an indeterminant number of cusps. Aortic valve regurgitation is mild. Aortic regurgitation PHT measures 752 msec. Aortic valve sclerosis is present, with no evidence of aortic valve stenosis. Aortic valve peak gradient measures 5.0 mmHg. Pulmonic Valve: The pulmonic valve was normal in structure. Pulmonic valve regurgitation is not visualized. No evidence of pulmonic stenosis. Aorta: The aortic root is normal in size and structure. Venous: The inferior vena cava is normal in size with greater than 50%  respiratory variability, suggesting right atrial pressure of 3 mmHg. IAS/Shunts: No atrial level shunt detected by color flow Doppler.  LEFT VENTRICLE PLAX 2D LVIDd:         4.36 cm     Diastology LVIDs:         2.98 cm     LV e' medial:    8.70 cm/s LV PW:         1.49 cm     LV E/e' medial:  10.3 LV IVS:        1.54 cm     LV e' lateral:   8.70 cm/s LVOT diam:     2.10 cm     LV E/e' lateral: 10.3 LV SV:         58 LV SV Index:   29 LVOT Area:     3.46 cm  LV Volumes (MOD) LV vol d, MOD A2C: 78.9 ml LV vol d, MOD A4C: 89.8 ml LV vol s, MOD A2C: 37.6 ml LV vol s, MOD A4C: 33.2 ml LV SV MOD A2C:     41.3 ml LV SV MOD A4C:     89.8 ml LV SV MOD BP:      49.4 ml RIGHT VENTRICLE RV Basal diam:  2.45 cm RV S prime:     12.00 cm/s TAPSE (M-mode): 2.3 cm LEFT ATRIUM             Index        RIGHT ATRIUM           Index LA diam:        4.10 cm 2.08 cm/m   RA Area:     14.30 cm LA Vol (A2C):   52.5 ml 26.62 ml/m  RA Volume:   31.70 ml  16.07 ml/m LA Vol (A4C):   67.1 ml 34.02 ml/m LA Biplane Vol: 61.6 ml 31.23 ml/m  AORTIC VALVE                 PULMONIC VALVE AV Area (Vmax): 2.63 cm     PV Vmax:       0.76 m/s AV Vmax:        112.00 cm/s  PV Peak grad:  2.3 mmHg AV Peak Grad:   5.0 mmHg LVOT Vmax:      84.90 cm/s LVOT Vmean:     50.000 cm/s LVOT VTI:  0.167 m AI PHT:         752 msec  AORTA Ao Root diam: 3.30 cm Ao Asc diam:  3.00 cm MITRAL VALVE MV Area (PHT): 3.36 cm    SHUNTS MV Decel Time: 226 msec    Systemic VTI:  0.17 m MV E velocity: 90.00 cm/s  Systemic Diam: 2.10 cm Ida Rogue MD Electronically signed by Ida Rogue MD Signature Date/Time: 05/08/2022/10:31:58 PM    Final    DG Knee 2 Views Right  Result Date: 05/08/2022 CLINICAL DATA:  Right knee pain EXAM: RIGHT KNEE - 1-2 VIEW COMPARISON:  None Available. FINDINGS: No acute fracture or dislocation identified. Mild narrowing of the medial compartment and patellofemoral joint with small marginal osteophytes. No significant joint effusion.  Arterial vascular calcifications noted. IMPRESSION: No acute osseous abnormality identified. Electronically Signed   By: Ofilia Neas M.D.   On: 05/08/2022 12:32   DG Chest 2 View  Result Date: 05/08/2022 CLINICAL DATA:  Fall EXAM: CHEST - 2 VIEW COMPARISON:  Chest x-ray 05/13/2019 FINDINGS: Heart is enlarged. Calcified plaques in the aortic arch. Mild opacities at the lung bases likely represent subsegmental atelectasis. No definite pleural effusion or pneumothorax identified. IMPRESSION: Cardiomegaly and bibasilar subsegmental atelectasis. Electronically Signed   By: Ofilia Neas M.D.   On: 05/08/2022 12:30   CT Head Wo Contrast  Result Date: 05/08/2022 CLINICAL DATA:  Head trauma, minor (Age >= 65y); Neck trauma (Age >= 65y) EXAM: CT HEAD WITHOUT CONTRAST CT CERVICAL SPINE WITHOUT CONTRAST TECHNIQUE: Multidetector CT imaging of the head and cervical spine was performed following the standard protocol without intravenous contrast. Multiplanar CT image reconstructions of the cervical spine were also generated. RADIATION DOSE REDUCTION: This exam was performed according to the departmental dose-optimization program which includes automated exposure control, adjustment of the mA and/or kV according to patient size and/or use of iterative reconstruction technique. COMPARISON:  05/13/2019 FINDINGS: CT HEAD FINDINGS Brain: No evidence of acute infarction, hemorrhage, hydrocephalus, extra-axial collection or mass lesion/mass effect. Extensive low-density changes within the periventricular and subcortical white matter compatible with chronic microvascular ischemic change. Moderate diffuse cerebral volume loss. Vascular: Atherosclerotic calcifications involving the large vessels of the skull base. No unexpected hyperdense vessel. Skull: Normal. Negative for fracture or focal lesion. Sinuses/Orbits: Mild paranasal sinus mucosal thickening. No air-fluid levels. Mastoid air cells are clear. Other: Negative for  scalp hematoma. CT CERVICAL SPINE FINDINGS Alignment: Facet joints are aligned without dislocation or traumatic listhesis. Dens and lateral masses are aligned. Skull base and vertebrae: No acute fracture. No primary bone lesion or focal pathologic process. Soft tissues and spinal canal: No prevertebral fluid or swelling. No visible canal hematoma. Disc levels: Advanced degenerative disc disease and facet arthropathy throughout the cervical spine. Upper chest: Negative. Other: Well-circumscribed 2.3 cm ovoid cystic lesion within the superficial subcutaneous soft tissues of the posterior right neck at approximately the C4 level, likely a sebaceous or epidermoid cyst. IMPRESSION: 1. No acute intracranial abnormality. 2. No acute fracture or subluxation of the cervical spine. 3. Advanced chronic microvascular ischemic change and cerebral volume loss. 4. Advanced degenerative disc disease and facet arthropathy throughout the cervical spine. Electronically Signed   By: Davina Poke D.O.   On: 05/08/2022 12:06   CT Cervical Spine Wo Contrast  Result Date: 05/08/2022 CLINICAL DATA:  Head trauma, minor (Age >= 65y); Neck trauma (Age >= 65y) EXAM: CT HEAD WITHOUT CONTRAST CT CERVICAL SPINE WITHOUT CONTRAST TECHNIQUE: Multidetector CT imaging of the head and cervical spine  was performed following the standard protocol without intravenous contrast. Multiplanar CT image reconstructions of the cervical spine were also generated. RADIATION DOSE REDUCTION: This exam was performed according to the departmental dose-optimization program which includes automated exposure control, adjustment of the mA and/or kV according to patient size and/or use of iterative reconstruction technique. COMPARISON:  05/13/2019 FINDINGS: CT HEAD FINDINGS Brain: No evidence of acute infarction, hemorrhage, hydrocephalus, extra-axial collection or mass lesion/mass effect. Extensive low-density changes within the periventricular and subcortical white  matter compatible with chronic microvascular ischemic change. Moderate diffuse cerebral volume loss. Vascular: Atherosclerotic calcifications involving the large vessels of the skull base. No unexpected hyperdense vessel. Skull: Normal. Negative for fracture or focal lesion. Sinuses/Orbits: Mild paranasal sinus mucosal thickening. No air-fluid levels. Mastoid air cells are clear. Other: Negative for scalp hematoma. CT CERVICAL SPINE FINDINGS Alignment: Facet joints are aligned without dislocation or traumatic listhesis. Dens and lateral masses are aligned. Skull base and vertebrae: No acute fracture. No primary bone lesion or focal pathologic process. Soft tissues and spinal canal: No prevertebral fluid or swelling. No visible canal hematoma. Disc levels: Advanced degenerative disc disease and facet arthropathy throughout the cervical spine. Upper chest: Negative. Other: Well-circumscribed 2.3 cm ovoid cystic lesion within the superficial subcutaneous soft tissues of the posterior right neck at approximately the C4 level, likely a sebaceous or epidermoid cyst. IMPRESSION: 1. No acute intracranial abnormality. 2. No acute fracture or subluxation of the cervical spine. 3. Advanced chronic microvascular ischemic change and cerebral volume loss. 4. Advanced degenerative disc disease and facet arthropathy throughout the cervical spine. Electronically Signed   By: Davina Poke D.O.   On: 05/08/2022 12:06      Labs: BNP (last 3 results) Recent Labs    05/08/22 1340  BNP 616.0*   Basic Metabolic Panel: Recent Labs  Lab 05/08/22 1127 05/09/22 0540  NA 140 139  K 3.6 3.5  CL 107 107  CO2 27 26  GLUCOSE 95 106*  BUN 21 21  CREATININE 1.67* 1.68*  CALCIUM 8.7* 8.9  MG 2.0  --    Liver Function Tests: Recent Labs  Lab 05/08/22 1127  AST 15  ALT 13  ALKPHOS 50  BILITOT 0.6  PROT 6.4*  ALBUMIN 3.6   No results for input(s): "LIPASE", "AMYLASE" in the last 168 hours. No results for  input(s): "AMMONIA" in the last 168 hours. CBC: Recent Labs  Lab 05/08/22 1127 05/09/22 0540  WBC 7.6 7.8  NEUTROABS 3.5  --   HGB 10.2* 9.8*  HCT 30.9* 29.4*  MCV 95.1 93.3  PLT 261 261   Cardiac Enzymes: Recent Labs  Lab 05/08/22 1127  CKTOTAL 63   BNP: Invalid input(s): "POCBNP" CBG: No results for input(s): "GLUCAP" in the last 168 hours. D-Dimer No results for input(s): "DDIMER" in the last 72 hours. Hgb A1c No results for input(s): "HGBA1C" in the last 72 hours. Lipid Profile No results for input(s): "CHOL", "HDL", "LDLCALC", "TRIG", "CHOLHDL", "LDLDIRECT" in the last 72 hours. Thyroid function studies Recent Labs    05/08/22 1340  TSH 2.971   Anemia work up No results for input(s): "VITAMINB12", "FOLATE", "FERRITIN", "TIBC", "IRON", "RETICCTPCT" in the last 72 hours. Urinalysis    Component Value Date/Time   COLORURINE COLORLESS (A) 05/08/2022 1127   APPEARANCEUR CLEAR (A) 05/08/2022 1127   APPEARANCEUR Clear 05/27/2015 1351   LABSPEC 1.006 05/08/2022 1127   PHURINE 7.0 05/08/2022 1127   GLUCOSEU NEGATIVE 05/08/2022 1127   HGBUR SMALL (A) 05/08/2022 1127  BILIRUBINUR NEGATIVE 05/08/2022 1127   BILIRUBINUR Negative 05/27/2015 1351   KETONESUR NEGATIVE 05/08/2022 1127   PROTEINUR 100 (A) 05/08/2022 1127   NITRITE NEGATIVE 05/08/2022 1127   LEUKOCYTESUR NEGATIVE 05/08/2022 1127   Sepsis Labs Recent Labs  Lab 05/08/22 1127 05/09/22 0540  WBC 7.6 7.8   Microbiology Recent Results (from the past 240 hour(s))  Culture, blood (Routine X 2) w Reflex to ID Panel     Status: None (Preliminary result)   Collection Time: 05/08/22  2:24 PM   Specimen: BLOOD  Result Value Ref Range Status   Specimen Description BLOOD BLOOD LEFT FOREARM  Final   Special Requests   Final    BOTTLES DRAWN AEROBIC AND ANAEROBIC Blood Culture adequate volume   Culture   Final    NO GROWTH < 24 HOURS Performed at [facility], [address] [place], [place]  [address]    Report Status PENDING  Incomplete  Culture, blood (Routine X 2) w Reflex to ID Panel     Status: None (Preliminary result)   Collection Time: 05/08/22  2:24 PM   Specimen: BLOOD  Result Value Ref Range Status   Specimen Description BLOOD RIGHT ANTECUBITAL  Final   Special Requests   Final    BOTTLES DRAWN AEROBIC AND ANAEROBIC Blood Culture adequate volume   Culture   Final    NO GROWTH < 24 HOURS Performed at [facility], [address] [place], [place] [address]    Report Status PENDING  Incomplete     Total time spend on discharging this patient, including the last patient exam, discussing the hospital stay, instructions for ongoing care as it relates to all pertinent caregivers, as well as preparing the medical discharge records, prescriptions, and/or referrals as applicable, is 45 minutes.    Enzo Bi, MD  [facility] 05/09/2022, 1:43 PM

## 2022-05-13 LAB — CULTURE, BLOOD (ROUTINE X 2)
Culture: NO GROWTH
Culture: NO GROWTH
Special Requests: ADEQUATE
Special Requests: ADEQUATE

## 2022-05-14 ENCOUNTER — Ambulatory Visit (INDEPENDENT_AMBULATORY_CARE_PROVIDER_SITE_OTHER): Payer: Medicare HMO | Admitting: Internal Medicine

## 2022-05-14 ENCOUNTER — Encounter: Payer: Self-pay | Admitting: Internal Medicine

## 2022-05-14 VITALS — BP 137/74 | HR 66 | Ht 70.0 in | Wt 175.0 lb

## 2022-05-14 DIAGNOSIS — N411 Chronic prostatitis: Secondary | ICD-10-CM | POA: Diagnosis not present

## 2022-05-14 DIAGNOSIS — D500 Iron deficiency anemia secondary to blood loss (chronic): Secondary | ICD-10-CM | POA: Insufficient documentation

## 2022-05-14 DIAGNOSIS — I10 Essential (primary) hypertension: Secondary | ICD-10-CM | POA: Diagnosis not present

## 2022-05-14 DIAGNOSIS — T671XXD Heat syncope, subsequent encounter: Secondary | ICD-10-CM

## 2022-05-14 DIAGNOSIS — R0681 Apnea, not elsewhere classified: Secondary | ICD-10-CM

## 2022-05-14 MED ORDER — AMLODIPINE BESYLATE 5 MG PO TABS
5.0000 mg | ORAL_TABLET | Freq: Every day | ORAL | 2 refills | Status: DC
Start: 2022-05-14 — End: 2023-01-31

## 2022-05-14 MED ORDER — LOSARTAN POTASSIUM 100 MG PO TABS: 100.0000 mg | ORAL_TABLET | Freq: Every day | ORAL | 2 refills | Status: DC

## 2022-05-14 NOTE — Assessment & Plan Note (Signed)
No further episode of syncope.  Patient can walk without the help of her walker, he has underlying dementia and does not want to take his medication on a regular basis

## 2022-05-14 NOTE — Assessment & Plan Note (Signed)
Stable at the present time. 

## 2022-05-14 NOTE — Progress Notes (Signed)
Established Patient Office Visit  Subjective:  Patient ID: Charles Sims, male    DOB: 1930-01-13  Age: 88 y.o. MRN: [id number]  CC:  Chief Complaint  Patient presents with   Hospitalization Follow-up    Patient was admitted on 05/08/22 and discharged on 05/09/22    HPI  Charles Sims presents for patient was seen as a follow-up from the hospital.  Patient is known to have history of fall for which she was admitted into the hospital.  He is known to have dementia chronic kidney disease history of fall at home.  Labile hypertension and he is not very compliant with his medication.  CT of the scan in the hospital was unremarkable.  He has been seen by a neurologist in the past further work-up negative functions today blood pressure examination is normal chest x-ray in the hospital did not show any congestive heart failure, CT scan did not show any acute intracranial abnormality. Past Medical History:  Diagnosis Date   Adenoma of right adrenal gland    Arthritis    Benign prostatic hypertrophy with lower urinary tract symptoms (LUTS)    Bladder calculi    Bradycardia    CAD (coronary artery disease)    Carotid stenosis    Coronary artery disease    Dysrhythmia    bradycardia   Elevated PSA    GERD (gastroesophageal reflux disease)    Gross hematuria    Hyperlipidemia    Hypertension    Prostatitis    Shortness of breath dyspnea    Urinary frequency     Past Surgical History:  Procedure Laterality Date   CAROTID ENDARTERECTOMY Left    CYSTOSCOPY WITH LITHOLAPAXY N/A 04/13/2015   Procedure: CYSTOSCOPY WITH LITHOLAPAXY;  Surgeon: Hollice Espy, MD;  Location: [facility] OR[facility];  Service: Urology;  Laterality: N/A;    Family History  Problem Relation Age of Onset   Kidney cancer Father    Kidney cancer Paternal Grandfather    Alzheimer's disease Mother     Social History   Socioeconomic History   Marital status: Married    Spouse name: Not on file   Number of children: Not on  file   Years of education: Not on file   Highest education level: Not on file  Occupational History   Not on file  Tobacco Use   Smoking status: Former    Types: Cigarettes    Quit date: 04/04/2005    Years since quitting: 17.1   Smokeless tobacco: Never  Substance and Sexual Activity   Alcohol use: No   Drug use: No   Sexual activity: Not on file  Other Topics Concern   Not on file  Social History Narrative   Not on file   Social Determinants of Health   Financial Resource Strain: Not on file  Food Insecurity: Not on file  Transportation Needs: No Transportation Needs (05/21/2019)   PRAPARE - Hydrologist (Medical): No    Lack of Transportation (Non-Medical): No  Physical Activity: Not on file  Stress: Not on file  Social Connections: Not on file  Intimate Partner Violence: Not on file     Current Outpatient Medications:    losartan (COZAAR) 100 MG tablet, Take 1 tablet (100 mg total) by mouth daily., Disp: 90 tablet, Rfl: 2   amLODipine (NORVASC) 5 MG tablet, Take 1 tablet (5 mg total) by mouth daily., Disp: 30 tablet, Rfl: 2   Allergies  Allergen Reactions  Lisinopril Cough    ROS Review of Systems  Constitutional: Negative.   HENT: Negative.    Eyes: Negative.   Respiratory: Negative.    Cardiovascular: Negative.   Gastrointestinal: Negative.   Endocrine: Negative.   Genitourinary: Negative.   Musculoskeletal: Negative.   Skin: Negative.   Allergic/Immunologic: Negative.   Neurological: Negative.   Hematological: Negative.   Psychiatric/Behavioral: Negative.    All other systems reviewed and are negative.     Objective:    Physical Exam Vitals reviewed.  Constitutional:      Appearance: Normal appearance.  HENT:     Mouth/Throat:     Mouth: Mucous membranes are moist.  Eyes:     Pupils: Pupils are equal, round, and reactive to light.  Neck:     Vascular: No carotid bruit.  Cardiovascular:     Rate and Rhythm:  Normal rate and regular rhythm.     Pulses: Normal pulses.     Heart sounds: Normal heart sounds.  Pulmonary:     Effort: Pulmonary effort is normal.     Breath sounds: Normal breath sounds.  Abdominal:     General: Bowel sounds are normal.     Palpations: Abdomen is soft. There is no hepatomegaly, splenomegaly or mass.     Tenderness: There is no abdominal tenderness.     Hernia: No hernia is present.  Musculoskeletal:     Cervical back: Neck supple.     Right lower leg: No edema.     Left lower leg: No edema.  Skin:    Findings: No rash.  Neurological:     Mental Status: He is alert and oriented to person, place, and time.     Motor: No weakness.  Psychiatric:        Mood and Affect: Mood normal.        Behavior: Behavior normal.     BP 137/74   Pulse 66   Ht '5\' 10"'$  (1.778 m)   Wt 175 lb (79.4 kg)   BMI 25.11 kg/m  Wt Readings from Last 3 Encounters:  05/14/22 175 lb (79.4 kg)  05/08/22 175 lb (79.4 kg)  05/13/19 192 lb 10.9 oz (87.4 kg)     Health Maintenance Due  Topic Date Due   COVID-19 Vaccine (1) Never done   Pneumonia Vaccine 71+ Years old (1 - PCV) Never done   TETANUS/TDAP  Never done   Zoster Vaccines- Shingrix (1 of 2) Never done    There are no preventive care reminders to display for this patient.  Lab Results  Component Value Date   TSH 2.971 05/08/2022   Lab Results  Component Value Date   WBC 7.8 05/09/2022   HGB 9.8 (L) 05/09/2022   HCT 29.4 (L) 05/09/2022   MCV 93.3 05/09/2022   PLT 261 05/09/2022   Lab Results  Component Value Date   NA 139 05/09/2022   K 3.5 05/09/2022   CO2 26 05/09/2022   GLUCOSE 106 (H) 05/09/2022   BUN 21 05/09/2022   CREATININE 1.68 (H) 05/09/2022   BILITOT 0.6 05/08/2022   ALKPHOS 50 05/08/2022   AST 15 05/08/2022   ALT 13 05/08/2022   PROT 6.4 (L) 05/08/2022   ALBUMIN 3.6 05/08/2022   CALCIUM 8.9 05/09/2022   ANIONGAP 6 05/09/2022   No results found for: "CHOL" No results found for: "HDL" No  results found for: "LDLCALC" No results found for: "TRIG" No results found for: "CHOLHDL" No results found for: "HGBA1C"  Assessment & Plan:   Problem List Items Addressed This Visit       Cardiovascular and Mediastinum   Essential hypertension - Primary    The following hypertensive lifestyle modification were recommended and discussed:  1. Limiting alcohol intake to less than 1 oz/day of ethanol:(24 oz of beer or 8 oz of wine or 2 oz of 100-proof whiskey). 2. Take baby ASA 81 mg daily. 3. Importance of regular aerobic exercise and losing weight. 4. Reduce dietary saturated fat and cholesterol intake for overall cardiovascular health. 5. Maintaining adequate dietary potassium, calcium, and magnesium intake. 6. Regular monitoring of the blood pressure. 7. Reduce sodium intake to less than 100 mmol/day (less than 2.3 gm of sodium or less than 6 gm of sodium choride)       Relevant Medications   amLODipine (NORVASC) 5 MG tablet   losartan (COZAAR) 100 MG tablet     Genitourinary   Prostatitis    Stable at the present time        Other   Breathlessness on exertion    Resolved at the present time      Syncope    No further episode of syncope.  Patient can walk without the help of her walker, he has underlying dementia and does not want to take his medication on a regular basis      Iron deficiency anemia due to chronic blood loss    Patient was advised to drink Ensure 1 can every day       Meds ordered this encounter  Medications   amLODipine (NORVASC) 5 MG tablet    Sig: Take 1 tablet (5 mg total) by mouth daily.    Dispense:  30 tablet    Refill:  2   losartan (COZAAR) 100 MG tablet    Sig: Take 1 tablet (100 mg total) by mouth daily.    Dispense:  90 tablet    Refill:  2    Follow-up: No follow-ups on file.    [name], MD

## 2022-05-14 NOTE — Assessment & Plan Note (Signed)

## 2022-05-14 NOTE — Assessment & Plan Note (Signed)
Resolved at the present time. 

## 2022-05-14 NOTE — Assessment & Plan Note (Signed)
Patient was advised to drink Ensure 1 can every day

## 2022-06-12 ENCOUNTER — Ambulatory Visit: Payer: Medicare HMO | Admitting: Internal Medicine

## 2022-06-16 DIAGNOSIS — W19XXXA Unspecified fall, initial encounter: Secondary | ICD-10-CM | POA: Diagnosis not present

## 2022-06-16 DIAGNOSIS — R69 Illness, unspecified: Secondary | ICD-10-CM | POA: Diagnosis not present

## 2022-06-18 ENCOUNTER — Encounter: Payer: Self-pay | Admitting: Internal Medicine

## 2022-06-18 ENCOUNTER — Ambulatory Visit (INDEPENDENT_AMBULATORY_CARE_PROVIDER_SITE_OTHER): Payer: Medicare HMO | Admitting: Internal Medicine

## 2022-06-18 VITALS — BP 138/71 | HR 58 | Ht 70.0 in | Wt 175.0 lb

## 2022-06-18 DIAGNOSIS — D500 Iron deficiency anemia secondary to blood loss (chronic): Secondary | ICD-10-CM

## 2022-06-18 DIAGNOSIS — I10 Essential (primary) hypertension: Secondary | ICD-10-CM | POA: Diagnosis not present

## 2022-06-18 DIAGNOSIS — N138 Other obstructive and reflux uropathy: Secondary | ICD-10-CM

## 2022-06-18 DIAGNOSIS — N401 Enlarged prostate with lower urinary tract symptoms: Secondary | ICD-10-CM

## 2022-06-18 DIAGNOSIS — T671XXD Heat syncope, subsequent encounter: Secondary | ICD-10-CM

## 2022-06-18 NOTE — Progress Notes (Signed)
Established Patient Office Visit  Subjective:  Patient ID: Charles Sims, male    DOB: 03-04-1930  Age: 88 y.o. MRN: [id number]  CC:  Chief Complaint  Patient presents with   Hypertension    1 month follow up    Hypertension    Charles Sims presents for check up patient has trouble walking.,  He also had his sundown syndrome, he is also mildly anemic.,  He does not drink enough water.  He drinks a lot of tea and Coke. He has trouble sleeping at night.  This  Past Medical History:  Diagnosis Date   Adenoma of right adrenal gland    Arthritis    Benign prostatic hypertrophy with lower urinary tract symptoms (LUTS)    Bladder calculi    Bradycardia    CAD (coronary artery disease)    Carotid stenosis    Coronary artery disease    Dysrhythmia    bradycardia   Elevated PSA    GERD (gastroesophageal reflux disease)    Gross hematuria    Hyperlipidemia    Hypertension    Prostatitis    Shortness of breath dyspnea    Urinary frequency     Past Surgical History:  Procedure Laterality Date   CAROTID ENDARTERECTOMY Left    CYSTOSCOPY WITH LITHOLAPAXY N/A 04/13/2015   Procedure: CYSTOSCOPY WITH LITHOLAPAXY;  Surgeon: Hollice Espy, MD;  Location: [facility] OR[facility];  Service: Urology;  Laterality: N/A;    Family History  Problem Relation Age of Onset   Kidney cancer Father    Kidney cancer Paternal Grandfather    Alzheimer's disease Mother     Social History   Socioeconomic History   Marital status: Married    Spouse name: Not on file   Number of children: Not on file   Years of education: Not on file   Highest education level: Not on file  Occupational History   Not on file  Tobacco Use   Smoking status: Former    Types: Cigarettes    Quit date: 04/04/2005    Years since quitting: 17.2   Smokeless tobacco: Never  Substance and Sexual Activity   Alcohol use: No   Drug use: No   Sexual activity: Not on file  Other Topics Concern   Not on file  Social History  Narrative   Not on file   Social Determinants of Health   Financial Resource Strain: Not on file  Food Insecurity: Not on file  Transportation Needs: No Transportation Needs (05/21/2019)   PRAPARE - Hydrologist (Medical): No    Lack of Transportation (Non-Medical): No  Physical Activity: Not on file  Stress: Not on file  Social Connections: Not on file  Intimate Partner Violence: Not on file     Current Outpatient Medications:    amLODipine (NORVASC) 5 MG tablet, Take 1 tablet (5 mg total) by mouth daily., Disp: 30 tablet, Rfl: 2   losartan (COZAAR) 100 MG tablet, Take 1 tablet (100 mg total) by mouth daily., Disp: 90 tablet, Rfl: 2   Allergies  Allergen Reactions   Lisinopril Cough    ROS Review of Systems  Constitutional: Negative.   HENT: Negative.    Eyes: Negative.   Respiratory: Negative.    Cardiovascular: Negative.   Gastrointestinal: Negative.   Endocrine: Negative.   Genitourinary: Negative.   Musculoskeletal: Negative.   Skin: Negative.   Allergic/Immunologic: Negative.   Neurological: Negative.   Hematological: Negative.   Psychiatric/Behavioral: Negative.  All other systems reviewed and are negative.     Objective:    Physical Exam Vitals reviewed.  Constitutional:      Appearance: Normal appearance.  HENT:     Mouth/Throat:     Mouth: Mucous membranes are moist.  Eyes:     Pupils: Pupils are equal, round, and reactive to light.  Neck:     Vascular: No carotid bruit.  Cardiovascular:     Rate and Rhythm: Normal rate and regular rhythm.     Pulses: Normal pulses.     Heart sounds: Normal heart sounds.  Pulmonary:     Effort: Pulmonary effort is normal.     Breath sounds: Normal breath sounds.  Abdominal:     General: Bowel sounds are normal.     Palpations: Abdomen is soft. There is no hepatomegaly, splenomegaly or mass.     Tenderness: There is no abdominal tenderness.     Hernia: No hernia is present.   Musculoskeletal:     Cervical back: Neck supple.     Right lower leg: No edema.     Left lower leg: No edema.  Skin:    Findings: No rash.  Neurological:     Mental Status: He is alert and oriented to person, place, and time.     Motor: No weakness.  Psychiatric:        Mood and Affect: Mood normal.        Behavior: Behavior normal.     BP 138/71   Pulse (!) 58   Ht '5\' 10"'$  (1.778 m)   Wt 175 lb (79.4 kg)   BMI 25.11 kg/m  Wt Readings from Last 3 Encounters:  06/18/22 175 lb (79.4 kg)  05/14/22 175 lb (79.4 kg)  05/08/22 175 lb (79.4 kg)     Health Maintenance Due  Topic Date Due   COVID-19 Vaccine (1) Never done   Pneumonia Vaccine 43+ Years old (1 - PCV) Never done   TETANUS/TDAP  Never done   Zoster Vaccines- Shingrix (1 of 2) Never done   INFLUENZA VACCINE  06/05/2022    There are no preventive care reminders to display for this patient.  Lab Results  Component Value Date   TSH 2.971 05/08/2022   Lab Results  Component Value Date   WBC 7.8 05/09/2022   HGB 9.8 (L) 05/09/2022   HCT 29.4 (L) 05/09/2022   MCV 93.3 05/09/2022   PLT 261 05/09/2022   Lab Results  Component Value Date   NA 139 05/09/2022   K 3.5 05/09/2022   CO2 26 05/09/2022   GLUCOSE 106 (H) 05/09/2022   BUN 21 05/09/2022   CREATININE 1.68 (H) 05/09/2022   BILITOT 0.6 05/08/2022   ALKPHOS 50 05/08/2022   AST 15 05/08/2022   ALT 13 05/08/2022   PROT 6.4 (L) 05/08/2022   ALBUMIN 3.6 05/08/2022   CALCIUM 8.9 05/09/2022   ANIONGAP 6 05/09/2022   No results found for: "CHOL" No results found for: "HDL" No results found for: "LDLCALC" No results found for: "TRIG" No results found for: "CHOLHDL" No results found for: "HGBA1C"    Assessment & Plan:   Problem List Items Addressed This Visit       Cardiovascular and Mediastinum   Essential hypertension - Primary    Under control at the present time        Genitourinary   Benign prostatic hyperplasia with urinary  obstruction    Stable no incontinence  Other   Syncope    Patient has frequent fall, I told the patient to use her walker every time he gets up to walk.  Drink a lot of fluid.  He needs to quit drinking tea       Iron deficiency anemia due to chronic blood loss    Stable       No orders of the defined types were placed in this encounter.   Follow-up: No follow-ups on file.    Charles Athens, MD

## 2022-06-18 NOTE — Assessment & Plan Note (Signed)
Stable

## 2022-06-18 NOTE — Assessment & Plan Note (Signed)
Under control at the present time 

## 2022-06-18 NOTE — Assessment & Plan Note (Signed)
Patient has frequent fall, I told the patient to use her walker every time he gets up to walk.  Drink a lot of fluid.  He needs to quit drinking tea

## 2022-06-18 NOTE — Assessment & Plan Note (Signed)
Stable no incontinence

## 2022-08-13 ENCOUNTER — Ambulatory Visit: Payer: Medicare HMO | Admitting: Internal Medicine

## 2022-08-14 DIAGNOSIS — L723 Sebaceous cyst: Secondary | ICD-10-CM | POA: Diagnosis not present

## 2023-01-24 ENCOUNTER — Other Ambulatory Visit: Payer: Self-pay

## 2023-01-24 ENCOUNTER — Emergency Department: Payer: Medicare HMO

## 2023-01-24 ENCOUNTER — Inpatient Hospital Stay
Admission: EM | Admit: 2023-01-24 | Discharge: 2023-01-28 | DRG: 065 | Disposition: A | Discharge status: Home Health | Payer: Medicare HMO | Attending: Internal Medicine | Admitting: Internal Medicine

## 2023-01-24 ENCOUNTER — Observation Stay: Payer: Medicare HMO

## 2023-01-24 DIAGNOSIS — F32A Depression, unspecified: Secondary | ICD-10-CM | POA: Diagnosis present

## 2023-01-24 DIAGNOSIS — K219 Gastro-esophageal reflux disease without esophagitis: Secondary | ICD-10-CM | POA: Diagnosis present

## 2023-01-24 DIAGNOSIS — N401 Enlarged prostate with lower urinary tract symptoms: Secondary | ICD-10-CM | POA: Diagnosis not present

## 2023-01-24 DIAGNOSIS — Z8051 Family history of malignant neoplasm of kidney: Secondary | ICD-10-CM | POA: Diagnosis not present

## 2023-01-24 DIAGNOSIS — E440 Moderate protein-calorie malnutrition: Secondary | ICD-10-CM | POA: Diagnosis not present

## 2023-01-24 DIAGNOSIS — E876 Hypokalemia: Secondary | ICD-10-CM | POA: Diagnosis present

## 2023-01-24 DIAGNOSIS — R4182 Altered mental status, unspecified: Secondary | ICD-10-CM | POA: Diagnosis not present

## 2023-01-24 DIAGNOSIS — R5381 Other malaise: Secondary | ICD-10-CM | POA: Diagnosis not present

## 2023-01-24 DIAGNOSIS — R404 Transient alteration of awareness: Secondary | ICD-10-CM | POA: Diagnosis not present

## 2023-01-24 DIAGNOSIS — I639 Cerebral infarction, unspecified: Secondary | ICD-10-CM | POA: Insufficient documentation

## 2023-01-24 DIAGNOSIS — Z79899 Other long term (current) drug therapy: Secondary | ICD-10-CM | POA: Diagnosis not present

## 2023-01-24 DIAGNOSIS — N138 Other obstructive and reflux uropathy: Secondary | ICD-10-CM | POA: Diagnosis present

## 2023-01-24 DIAGNOSIS — R627 Adult failure to thrive: Secondary | ICD-10-CM

## 2023-01-24 DIAGNOSIS — Z9861 Coronary angioplasty status: Secondary | ICD-10-CM | POA: Diagnosis not present

## 2023-01-24 DIAGNOSIS — E785 Hyperlipidemia, unspecified: Secondary | ICD-10-CM | POA: Diagnosis present

## 2023-01-24 DIAGNOSIS — I6340 Cerebral infarction due to embolism of unspecified cerebral artery: Principal | ICD-10-CM | POA: Diagnosis present

## 2023-01-24 DIAGNOSIS — Z6822 Body mass index (BMI) 22.0-22.9, adult: Secondary | ICD-10-CM

## 2023-01-24 DIAGNOSIS — Z87891 Personal history of nicotine dependence: Secondary | ICD-10-CM

## 2023-01-24 DIAGNOSIS — I63232 Cerebral infarction due to unspecified occlusion or stenosis of left carotid arteries: Secondary | ICD-10-CM | POA: Diagnosis not present

## 2023-01-24 DIAGNOSIS — Z820 Family history of epilepsy and other diseases of the nervous system: Secondary | ICD-10-CM | POA: Diagnosis not present

## 2023-01-24 DIAGNOSIS — I6389 Other cerebral infarction: Secondary | ICD-10-CM | POA: Diagnosis not present

## 2023-01-24 DIAGNOSIS — R531 Weakness: Secondary | ICD-10-CM

## 2023-01-24 DIAGNOSIS — I2510 Atherosclerotic heart disease of native coronary artery without angina pectoris: Secondary | ICD-10-CM | POA: Diagnosis present

## 2023-01-24 DIAGNOSIS — R29701 NIHSS score 1: Secondary | ICD-10-CM | POA: Diagnosis present

## 2023-01-24 DIAGNOSIS — Z7189 Other specified counseling: Secondary | ICD-10-CM | POA: Diagnosis not present

## 2023-01-24 DIAGNOSIS — I10 Essential (primary) hypertension: Secondary | ICD-10-CM | POA: Diagnosis present

## 2023-01-24 DIAGNOSIS — G9389 Other specified disorders of brain: Secondary | ICD-10-CM | POA: Diagnosis not present

## 2023-01-24 DIAGNOSIS — R001 Bradycardia, unspecified: Secondary | ICD-10-CM | POA: Diagnosis not present

## 2023-01-24 DIAGNOSIS — Z888 Allergy status to other drugs, medicaments and biological substances status: Secondary | ICD-10-CM | POA: Diagnosis not present

## 2023-01-24 DIAGNOSIS — D500 Iron deficiency anemia secondary to blood loss (chronic): Secondary | ICD-10-CM | POA: Diagnosis present

## 2023-01-24 DIAGNOSIS — I63531 Cerebral infarction due to unspecified occlusion or stenosis of right posterior cerebral artery: Secondary | ICD-10-CM | POA: Diagnosis not present

## 2023-01-24 DIAGNOSIS — G319 Degenerative disease of nervous system, unspecified: Secondary | ICD-10-CM | POA: Diagnosis not present

## 2023-01-24 LAB — URINALYSIS, ROUTINE W REFLEX MICROSCOPIC
Bilirubin Urine: NEGATIVE
Glucose, UA: NEGATIVE mg/dL
Ketones, ur: 40 mg/dL — AB
Leukocytes,Ua: NEGATIVE
Nitrite: NEGATIVE
Protein, ur: >300 mg/dL — AB
Specific Gravity, Urine: 1.025 (ref 1.005–1.030)
pH: 6 (ref 5.0–8.0)

## 2023-01-24 LAB — COMPREHENSIVE METABOLIC PANEL
ALT: 9 U/L (ref 0–44)
AST: 14 U/L — ABNORMAL LOW (ref 15–41)
Albumin: 3.6 g/dL (ref 3.5–5.0)
Alkaline Phosphatase: 46 U/L (ref 38–126)
Anion gap: 13 (ref 5–15)
BUN: 23 mg/dL (ref 8–23)
CO2: 27 mmol/L (ref 22–32)
Calcium: 9 mg/dL (ref 8.9–10.3)
Chloride: 102 mmol/L (ref 98–111)
Creatinine, Ser: 1.74 mg/dL — ABNORMAL HIGH (ref 0.61–1.24)
GFR, Estimated: 37 mL/min — ABNORMAL LOW (ref >60)
Glucose, Bld: 85 mg/dL (ref 70–99)
Potassium: 3.2 mmol/L — ABNORMAL LOW (ref 3.5–5.1)
Sodium: 142 mmol/L (ref 135–145)
Total Bilirubin: 1.3 mg/dL — ABNORMAL HIGH (ref 0.3–1.2)
Total Protein: 6.2 g/dL — ABNORMAL LOW (ref 6.5–8.1)

## 2023-01-24 LAB — CBC
HCT: 34.8 % — ABNORMAL LOW (ref 39.0–52.0)
Hemoglobin: 11.5 g/dL — ABNORMAL LOW (ref 13.0–17.0)
MCH: 31.7 pg (ref 26.0–34.0)
MCHC: 33 g/dL (ref 30.0–36.0)
MCV: 95.9 fL (ref 80.0–100.0)
Platelets: 286 10*3/uL (ref 150–400)
RBC: 3.63 MIL/uL — ABNORMAL LOW (ref 4.22–5.81)
RDW: 13.6 % (ref 11.5–15.5)
WBC: 5.4 10*3/uL (ref 4.0–10.5)
nRBC: 0 % (ref 0.0–0.2)

## 2023-01-24 LAB — TROPONIN I (HIGH SENSITIVITY): Troponin I (High Sensitivity): 41 ng/L — ABNORMAL HIGH (ref <18)

## 2023-01-24 LAB — MAGNESIUM: Magnesium: 1.7 mg/dL (ref 1.7–2.4)

## 2023-01-24 MED ORDER — HYDRALAZINE HCL 20 MG/ML IJ SOLN: 5.0000 mg | Freq: Four times a day (QID) | INTRAMUSCULAR | Status: DC | PRN

## 2023-01-24 MED ORDER — ACETAMINOPHEN 325 MG PO TABS
650.0000 mg | ORAL_TABLET | ORAL | Status: DC | PRN
  Administered 2023-01-26 – 2023-01-27 (×4): 650 mg via ORAL
  Filled 2023-01-24 (×4): qty 2

## 2023-01-24 MED ORDER — STROKE: EARLY STAGES OF RECOVERY BOOK: Freq: Once | Status: AC

## 2023-01-24 MED ORDER — POLYETHYLENE GLYCOL 3350 17 G PO PACK: 17.0000 g | PACK | Freq: Every day | ORAL | Status: DC | PRN

## 2023-01-24 MED ORDER — ACETAMINOPHEN 650 MG RE SUPP: 650.0000 mg | RECTAL | Status: DC | PRN

## 2023-01-24 MED ORDER — HYDRALAZINE HCL 20 MG/ML IJ SOLN
5.0000 mg | Freq: Four times a day (QID) | INTRAMUSCULAR | Status: AC | PRN
  Administered 2023-01-24: 5 mg via INTRAVENOUS
  Filled 2023-01-24 (×2): qty 1

## 2023-01-24 MED ORDER — HYDRALAZINE HCL 20 MG/ML IJ SOLN: 5.0000 mg | Freq: Three times a day (TID) | INTRAMUSCULAR | Status: DC | PRN

## 2023-01-24 MED ORDER — HEPARIN SODIUM (PORCINE) 5000 UNIT/ML IJ SOLN: 5000.0000 [IU] | Freq: Three times a day (TID) | INTRAMUSCULAR | Status: DC

## 2023-01-24 MED ORDER — MAGNESIUM OXIDE -MG SUPPLEMENT 400 (240 MG) MG PO TABS
200.0000 mg | ORAL_TABLET | Freq: Once | ORAL | Status: AC
  Administered 2023-01-25: 200 mg via ORAL
  Filled 2023-01-24: qty 1

## 2023-01-24 MED ORDER — ACETAMINOPHEN 160 MG/5ML PO SOLN: 650.0000 mg | ORAL | Status: DC | PRN

## 2023-01-24 MED ORDER — SENNOSIDES-DOCUSATE SODIUM 8.6-50 MG PO TABS: 1.0000 | ORAL_TABLET | Freq: Every evening | ORAL | Status: DC | PRN

## 2023-01-24 MED ORDER — MAGNESIUM OXIDE -MG SUPPLEMENT 400 (240 MG) MG PO TABS: 400.0000 mg | ORAL_TABLET | Freq: Once | ORAL | Status: DC

## 2023-01-24 MED ORDER — SODIUM CHLORIDE 0.9 % IV BOLUS
1000.0000 mL | Freq: Once | INTRAVENOUS | Status: AC
  Administered 2023-01-24: 1000 mL via INTRAVENOUS

## 2023-01-24 MED ORDER — LACTATED RINGERS IV SOLN: INTRAVENOUS | Status: AC

## 2023-01-24 MED ORDER — POTASSIUM CHLORIDE CRYS ER 20 MEQ PO TBCR
30.0000 meq | EXTENDED_RELEASE_TABLET | Freq: Once | ORAL | Status: AC
  Administered 2023-01-24: 30 meq via ORAL
  Filled 2023-01-24: qty 2

## 2023-01-24 NOTE — ED Triage Notes (Addendum)
Pt from home via EMS- per daughter pt "hasn't been acting his normal self." Per EMS pt has not been taking meds or food for last couple days- since wife was put in rehab. Pt is alert, oriented to self only, follows commands. Pt denies pain. Per EMS, pt has been given THC and CBC for sleep. UTI and failure to thrive suspected by EMS.   BGL- 88 HR- 60 BP- 198/90 O2- 98% RA RR- 16

## 2023-01-24 NOTE — ED Notes (Signed)
RN found pt out of his bed standing. Pt took all monitors off and pulled out IV. Pt confused and was urinating on the floor. RN got pt cleaned up, put back on the monitor and back to bed with clean sheets and new gown with warm blankets. Pt unclear where he was stating that his friend went to his house to get him some clothes. RN reoriented pt and put bed alarm on.

## 2023-01-24 NOTE — ED Provider Notes (Signed)
[facility] Provider Note    Event Date/Time   First MD Initiated Contact with Patient 01/24/23 1317     (approximate)   History   Altered Mental Status   HPI  Charles Sims is a 89 y.o. male  here with AMS. Pt's daughters noticed that since pt's wife was put in a rehab facility, he's been refusing to eat/drink, not acting like himself, and more weak. He has not wanted anything. Denies any pain. Daughter has been taking care of him.  Level 5 caveat invoked as remainder of history, ROS, and physical exam limited due to patient's altered mental status.     Physical Exam   Triage Vital Signs: ED Triage Vitals [01/24/23 1318]  Enc Vitals Group     BP (!) 222/80     Pulse Rate 60     Resp 18     Temp 97.7 F (36.5 C)     Temp Source Oral     SpO2 99 %     Weight      Height      Head Circumference      Peak Flow      Pain Score 0     Pain Loc      Pain Edu?      Excl. in [place]?     Most recent vital signs: Vitals:   01/24/23 2002 01/24/23 2224  BP: (!) 202/70 (!) 164/77  Pulse: 73 61  Resp: (!) 22 20  Temp:  98 F (36.7 C)  SpO2: 98% 100%     General: Awake, no distress.  CV:  Mild bradycardic, normal peripheral perfusion. Resp:  Normal work of breathing. Lungs clear bilaterally. Abd:  No distention. No tenderness. Other:  Withdrawn, dry MM.   ED Results / Procedures / Treatments   Labs (all labs ordered are listed, but only abnormal results are displayed) Labs Reviewed  COMPREHENSIVE METABOLIC PANEL - Abnormal; Notable for the following components:      Result Value   Potassium 3.2 (*)    Creatinine, Ser 1.74 (*)    Total Protein 6.2 (*)    AST 14 (*)    Total Bilirubin 1.3 (*)    GFR, Estimated 37 (*)    All other components within normal limits  CBC - Abnormal; Notable for the following components:   RBC 3.63 (*)    Hemoglobin 11.5 (*)    HCT 34.8 (*)    All other components within normal limits  URINALYSIS, ROUTINE  W REFLEX MICROSCOPIC - Abnormal; Notable for the following components:   APPearance CLEAR (*)    Hgb urine dipstick MODERATE (*)    Ketones, ur 40 (*)    Protein, ur >300 (*)    Bacteria, UA RARE (*)    All other components within normal limits  TROPONIN I (HIGH SENSITIVITY) - Abnormal; Notable for the following components:   Troponin I (High Sensitivity) 41 (*)    All other components within normal limits  MAGNESIUM  VITAMIN B12  LIPID PANEL  CBG MONITORING, ED     EKG    RADIOLOGY CXR: Clear CT Head: Hypodensity in L frontal lobe, new from prior exam but age indeterminate   I also independently reviewed and agree with radiologist interpretations.   PROCEDURES:  Critical Care performed: No  .1-3 Lead EKG Interpretation  Performed by: Duffy Bruce, MD Authorized by: Duffy Bruce, MD     Interpretation: non-specific     ECG rate:  50-70   ECG rate assessment: bradycardic     Rhythm: sinus bradycardia     Ectopy: none     Conduction: normal   Comments:     Indication: Weakness     MEDICATIONS ORDERED IN ED: Medications  acetaminophen (TYLENOL) tablet 650 mg (has no administration in time range)    Or  acetaminophen (TYLENOL) 160 MG/5ML solution 650 mg (has no administration in time range)    Or  acetaminophen (TYLENOL) suppository 650 mg (has no administration in time range)  senna-docusate (Senokot-S) tablet 1 tablet (has no administration in time range)  polyethylene glycol (MIRALAX / GLYCOLAX) packet 17 g (has no administration in time range)  lactated ringers infusion ( Intravenous New Bag/Given 01/24/23 1732)  hydrALAZINE (APRESOLINE) injection 5 mg (5 mg Intravenous Given 01/24/23 1735)  sodium chloride 0.9 % bolus 1,000 mL (0 mLs Intravenous Stopped 01/24/23 1713)   stroke: early stages of recovery book ( Does not apply Given 01/24/23 1913)  potassium chloride SA (KLOR-CON M) CR tablet 30 mEq (30 mEq Oral Given 01/24/23 1729)     IMPRESSION /  MDM / ASSESSMENT AND PLAN / ED COURSE  I reviewed the triage vital signs and the nursing notes.                              Differential diagnosis includes, but is not limited to, failure to thrive, dehydration, occult infection (UTI, PNA), anemia, polypharmacy, CVA  Patient's presentation is most consistent with acute presentation with potential threat to life or bodily function.  The patient is on the cardiac monitor to evaluate for evidence of arrhythmia and/or significant heart rate changes  89 yo M here with failure to thrive, weakness. Pt clinically appears dehydrated, withdrawn. CBC shows no leukocytosis or anemia. CMP with mild hypokalemia, elevated Cr. UA pending. CT head with age-indeterminate infarct in frontal lobe. CXR clear.  DDx remains CVA, pseudodementia/FTT in setting of his wife's recent SNF placement, occult UTI. Admit to medicine.   Clinical Course as of 01/24/23 2227  Thu Jan 24, 2023  1504 Failure to thrive and stopped PO or taking medication.  IVF and admission.  [SM]    Clinical Course User Index [SM] Nathaniel Man, MD     FINAL CLINICAL IMPRESSION(S) / ED DIAGNOSES   Final diagnoses:  Transient alteration of awareness  Failure to thrive in adult     Rx / DC Orders   ED Discharge Orders     None        Note:  This document was prepared using Dragon voice recognition software and may include unintentional dictation errors.   Duffy Bruce, MD 01/24/23 2227

## 2023-01-24 NOTE — Progress Notes (Signed)
       CROSS COVER NOTE  NAME: Charles Sims MRN: [id number] DOB : 19-Apr-1930    HPI/Events of Note   Report:f/u on mag level per Dr Tobie Poet request  On review of chart: Patient admitted today. Presenting symptoms change in mentation. Found to have ischemic stroke. K 3.2 and supplemented. MA 142. Bicarb 27, creat 1.74  Assessment and  Interventions   Assessment:  Plan: Mag ox 200 mg oral x 1     Kathlene Cote NP [facility]

## 2023-01-24 NOTE — Assessment & Plan Note (Signed)
Home amlodipine 5 mg daily, losartan 100 mg daily not resumed on admission

## 2023-01-24 NOTE — Consult Note (Signed)
NEURO HOSPITALIST CONSULT NOTE   Requesting physician: Dr. Tobie Poet  Reason for Consult: Acute strokes seen on MRI  History obtained from:  Chart     HPI:                                                                                                                                          Charles Sims is an 89 y.o. male with a PMHx of right adrenal adenoma, arthritis, BPH, bradycardia, CAD, carotid stenosis s/p left CEA, HLD, HTN, SOB and urinary frequency who presents to the ED for assessment after family noted him to be not acting like his normal self.  Per EMS the patient had not been taking meds or food for the last couple of days, since his wife was put in rehab. Although he was alert and following commands in Triage, he was oriented to self only. He denied any pain. EMS had been told that the patient had been given THC and CBD for sleep. UTI and failure to thrive were suspected by EMS. Vitals in Triage: HR- 60; BP- 198/90; 98% RA; RR- 16. CBG 88.  MRI brain was obtained, revealing a cluster of punctate acute ischemic infarctions within the posterior left parietal lobe.   Past Medical History:  Diagnosis Date   Adenoma of right adrenal gland    Arthritis    Benign prostatic hypertrophy with lower urinary tract symptoms (LUTS)    Bladder calculi    Bradycardia    CAD (coronary artery disease)    Carotid stenosis    Coronary artery disease    Dysrhythmia    bradycardia   Elevated PSA    GERD (gastroesophageal reflux disease)    Gross hematuria    Hyperlipidemia    Hypertension    Prostatitis    Shortness of breath dyspnea    Urinary frequency     Past Surgical History:  Procedure Laterality Date   CAROTID ENDARTERECTOMY Left    CYSTOSCOPY WITH LITHOLAPAXY N/A 04/13/2015   Procedure: CYSTOSCOPY WITH LITHOLAPAXY;  Surgeon: Hollice Espy, MD;  Location: [facility] ORS;  Service: Urology;  Laterality: N/A;    Family History  Problem Relation Age of Onset    Kidney cancer Father    Kidney cancer Paternal Grandfather    Alzheimer's disease Mother             Social History:  reports that he quit smoking about 17 years ago. His smoking use included cigarettes. He has never used smokeless tobacco. He reports that he does not drink alcohol and does not use drugs.  Allergies  Allergen Reactions   Lisinopril Cough    MEDICATIONS:  No current facility-administered medications on file prior to encounter.   Current Outpatient Medications on File Prior to Encounter  Medication Sig Dispense Refill   amLODipine (NORVASC) 5 MG tablet Take 1 tablet (5 mg total) by mouth daily. (Patient not taking: Reported on 01/24/2023) 30 tablet 2   losartan (COZAAR) 100 MG tablet Take 1 tablet (100 mg total) by mouth daily. (Patient not taking: Reported on 01/24/2023) 90 tablet 2    Continuous:  lactated ringers 125 mL/hr at 01/25/23 0304     ROS:                                                                                                                                       The patient is withdrawn and does not participate in ROS.    Blood pressure (!) 252/86, pulse 66, temperature 97.7 F (36.5 C), temperature source Oral, resp. rate 17, SpO2 98 %.   General Examination:                                                                                                       Physical Exam HEENT-  [place]/AT   Lungs- Respirations unlabored Extremities- Warm and well-perfused. No edema.   Neurological Examination Mental Status: Awake and alert. Appears withdrawn with dysthymic affect. Poverty of thought (alogia). Not oriented to the day, year, month, season or hospital. After being told that he is at [facility] he is able to correctly state that he is in [place], [place]. Speech is sparse with 1-2 word answers. Despite several requests, the patient would not  engage in conversation, therefore unable to assess for fluency and naming. Poorly cooperative but verbal comprehension grossly intact in the context of being able to follow some simple motor commands.  Cranial Nerves: II: Did fixate on examiner's face briefly during interview. Not cooperative with visual fields testing or with testing of pupillary light reflex.  III,IV, VI: Did gaze to the right and left on request with conjugate EOM. V: Temp sensation equal to forehead bilaterally VII: Grimaces symmetrically VIII: Hearing intact to voice IX,X: Hypophonic speech XI: Not cooperative with assessment XII: Midline tongue extension Motor: BUE with symmetric strength in the context of poor effort, rated as 4+/5 bilaterally There is rest tremor intermittently to his right hand.  Mild cogwheeling of BUE noted with assessment of tone.  Poorly cooperative with lower extremities - slightly pushes downwards with feet bilaterally but otherwise does not participate.  Sensory: Intact to gross touch in all  4 extremities.  Deep Tendon Reflexes: Not cooperative.  Cerebellar: No ataxia with finger to nose bilaterally. Action tremor noted to BUE. Does not cooperate with lower extremity coordination testing.  Gait: Deferred    Lab Results: Basic Metabolic Panel: Recent Labs  Lab 01/24/23 1323  NA 142  K 3.2*  CL 102  CO2 27  GLUCOSE 85  BUN 23  CREATININE 1.74*  CALCIUM 9.0    CBC: Recent Labs  Lab 01/24/23 1323  WBC 5.4  HGB 11.5*  HCT 34.8*  MCV 95.9  PLT 286    Cardiac Enzymes: No results for input(s): "CKTOTAL", "CKMB", "CKMBINDEX", "TROPONINI" in the last 168 hours.  Lipid Panel: No results for input(s): "CHOL", "TRIG", "HDL", "CHOLHDL", "VLDL", "LDLCALC" in the last 168 hours.  Imaging: MR BRAIN WO CONTRAST  Result Date: 01/24/2023 CLINICAL DATA:  Altered mental status EXAM: MRI HEAD WITHOUT CONTRAST TECHNIQUE: Multiplanar, multiecho pulse sequences of the brain and  surrounding structures were obtained without intravenous contrast. COMPARISON:  05/13/2019 MRI head, correlation is also made with 01/24/2023 CT head FINDINGS: Brain: Punctate foci of restricted diffusion with possible ADC correlate in the left parietal lobe (series 2, images 42-46), with an additional possible punctate focus left caudate (series 2, image 33). No acute hemorrhage, mass, mass effect, or midline shift. No hydrocephalus or extra-axial collection. Normal craniocervical junction. Advanced, age related cerebral atrophy, most prominent in the frontoparietal region. Encephalomalacia in the left frontal lobe correlates with the hypodensity on the same-day CT, likely sequela of remote infarct. Confluent T2 hyperintense signal in the periventricular white matter, likely the sequela of severe chronic small vessel ischemic disease. Vascular: Normal arterial flow voids. Skull and upper cervical spine: Normal marrow signal. Sinuses/Orbits: Clear paranasal sinuses. Status post bilateral lens replacements. Other: The mastoids are well aerated. T2 hyperintense lesion in the right posterior neck soft tissues, which measures up to 3.3 cm, most likely a subcutaneous cyst. IMPRESSION: Punctate foci of restricted diffusion in the left parietal lobe and possibly the left caudate, concerning for acute or subacute infarcts. These results will be called to the ordering clinician or representative by the Radiologist Assistant, and communication documented in the PACS or [employer]. Electronically Signed   By: Merilyn Baba M.D.   On: 01/24/2023 16:49   CT HEAD WO CONTRAST (5MM)  Result Date: 01/24/2023 CLINICAL DATA:  Altered mental status EXAM: CT HEAD WITHOUT CONTRAST TECHNIQUE: Contiguous axial images were obtained from the base of the skull through the vertex without intravenous contrast. RADIATION DOSE REDUCTION: This exam was performed according to the departmental dose-optimization program which includes  automated exposure control, adjustment of the mA and/or kV according to patient size and/or use of iterative reconstruction technique. COMPARISON:  05/08/2022 FINDINGS: Brain: Hypodensity in the left frontal lobe (series 2, image 19) is new from the prior exam but technically age indeterminate. No evidence of acute hemorrhage, mass, mass effect, or midline shift. No hydrocephalus or extra-axial fluid collection. Age related cerebral atrophy, with ex vacuo dilatation of the ventricles. Periventricular white matter changes, likely the sequela of chronic small vessel ischemic disease. Vascular: No hyperdense vessel. Atherosclerotic calcifications in the intracranial carotid and vertebral arteries. Skull: Negative for fracture or focal lesion. Sinuses/Orbits: Minimal mucosal thickening in the ethmoid air cells. No acute finding in the imaged orbits. Other: The mastoid air cells are well aerated. IMPRESSION: 1. Hypodensity in the left frontal lobe, new from the prior exam but technically age indeterminate. MRI could be considered for further evaluation. 2.  No acute intracranial hemorrhage. These results were called by telephone at the time of interpretation on 01/24/2023 at 2:40 pm to provider Duffy Bruce , who verbally acknowledged these results. Electronically Signed   By: Merilyn Baba M.D.   On: 01/24/2023 14:43   DG Chest Portable 1 View  Result Date: 01/24/2023 CLINICAL DATA:  Weakness. EXAM: PORTABLE CHEST 1 VIEW COMPARISON:  May 08, 2022. FINDINGS: Stable cardiomegaly. Both lungs are clear. The visualized skeletal structures are unremarkable. IMPRESSION: No active disease. Electronically Signed   By: Marijo Conception M.D.   On: 01/24/2023 14:28    Prior CTA head and neck, 05/13/19:  1. No LVO 2. Prior left carotid endarterectomy without residual or recurrent CCA/ICA stenosis. Persistent and/or recurrent 50% stenosis at the origin of the left ECA. 3. Short-segment severe at least 80% stenosis at the  origin of the dominant left vertebral artery.  4. Mild atherosclerotic change about the carotid siphons without hemodynamically significant or correctable stenosis.  Assessment: 89 year old male who presented to the hospital for assessment after family noted him to be not acting like his normal self. The patient had not been taking meds or food for the last couple of days, since his wife was put in rehab. Although he was alert and following commands in Triage, he was oriented to self only. MRI brain revealed a cluster of punctate acute ischemic infarctions in the left parietal lobe.   # Cluster of punctate left parietal lobe strokes on MRI brain: - Exam reveals a withdrawn patient with dysthymic affect and alogia who is not motivated to participate in the assessment. No gross motor weakness or facial droop is noted. Poorly oriented.   - CT head:  Hypodensity in the left frontal lobe, new from the prior exam but technically age indeterminate. Severe chronic small vessel ischemic changes as well as prominent diffuse atrophy are also noted.  - MRI brain: A cluster of punctate acute ischemic infarctions are seen within the posterior left parietal lobe on the DWI images. Also with possible DWI abnormality in the left caudate. Background atrophy and severe chronic small vessel ischemic disease within the white matter of the cerebral hemispheres is also noted.  - Elevated LDL and HDL cholesterol - TTE is pending - Stroke risk factors: CAD, carotid stenosis s/p left CEA, HLD and HTN  # AMS and possible FTT: - BUN is normal. Cr elevated at 1.74. eGFR is 37. Total protein is low at 6.2. - Troponin I elevated at 41 - WBC 5.4. Hgb low at 11.5. Platelets are normal.  - Urinalysis revealed elevated ketones and proteinuria - FTT may be due to grieving in the context of the patient being alone for the past 3 days since his wife was admitted to rehab - Vitamin B12 level is normal  # Incidental imaging  finding: - Also seen on the MRI images is a bright DWI and T2-hyperintense collection subcutaneously along to posterolateral aspect of the lower skull and upper neck on the right. DDx includes subcutaneous cyst. - May benefit from attempted drainage and culture of the cyst-like structure   Recommendations: - HgbA1c - CTA of head and neck - ASA 81 mg po qd - Based on his advanced age, long term benefits of statin use may be outweighed by short term risks - BP management. Out of the permissive HTN time window - PT consult, OT consult, Speech consult - Telemetry monitoring - Frequent neuro checks - NPO until passes stroke swallow screen -  Consider initiation of Prozac 20 mg po qd  Electronically signed: Dr. Kerney Elbe 01/24/2023, 8:03 PM

## 2023-01-24 NOTE — Assessment & Plan Note (Signed)
Etiology workup in progress Check B12 level, UA, MRI of the brain without contrast CT head without contrast was read as: Hypodensity in the left frontal lobe, new from prior exam but technically age-indeterminate Hydralazine 5 mg IV every 8 hours as needed for SBP greater than 190, 4 days ordered Home antihypertensive medication: Amlodipine 5 mg daily, losartan 100 mg daily were not resumed on admission due to possible new stroke and possible need of permissive hypertension

## 2023-01-24 NOTE — Assessment & Plan Note (Signed)
Check serum magnesium Status post sodium chloride 1 L bolus per EDP Potassium chloride 30 mill equivalent p.o. one-time dose ordered LR 125 mL/h, 1 day ordered BMP in the a.m.

## 2023-01-24 NOTE — Clinical Note (Incomplete)
       CROSS COVER NOTE  NAME: Charles Sims MRN: [id number] DOB : 1930/10/12    HPI/Events of Note   Report:f/u on mag level per Dr Tobie Poet request  On review of chart:*** Patient admitted today. Presenting symptoms change in mentation. Found to have ischemic stroke. K 3.2 and suplemented. MA 142. Bicarb 27, creat 1.74  Assessment and  Interventions   Assessment:  Plan: X X X

## 2023-01-24 NOTE — H&P (Signed)
History and Physical   Charles Sims [id number] DOB: March 02, 1930 DOA: 01/24/2023  PCP: Tonia Ghent, MD  Specialists provider: Dr. Leanna Sato [facility] Patient coming from: home via EMS  I have personally briefly reviewed patient's old medical records in [place].  Chief Concern: change in mentation  HPI: Charles Sims is a 89 year old male with history of hypertension who presents emergency department for chief concerns of change in mentation.  Vitals in the ED showed temperature of 97.7, respiration rate of 14, heart rate of 57, blood pressure 218/over 74, SpO2 100% on room air.  Serum sodium is 142, potassium 3.2, chloride of 102, bicarb 27, BUN of 27, serum creatinine of 1.74, nonfasting blood glucose 85, WBC 5.4, hemoglobin 11.5, platelets of 286.  High sensitive troponin is 41.  UA has been ordered and collected.  ED treatment: Sodium chloride 1 L bolus. ------------------------------- At bedside, patient was able to tell me his first and last name and his age of 89.  He was not able to tell me the current calendar year or his current location.  He is on his hands and knees in the ED bed trying to get out of bed and picking at the sheets.  Per daughter, two days ago, he refused to eat, drink, and would not talk to family for the last 2 days.  At bedside, patient denies chest pain, shortness of breath, abdominal pain.  He states he does not need to stay in the hospital, he needs to go home.  He states he does not have a stroke.  He states he is not hurting anywhere.  Social history: He lives at home by himself as his wife is in snf. Daughter helps during day and son stays at night.   ROS: Unable to complete as patient is confused.  ED Course: Discussed with emergency medicine provider, patient requiring hospitalization for chief concerns of altered mental status possible new stroke, or failure to thrive.  Assessment/Plan  Principal Problem:    Weakness Active Problems:   Benign prostatic hyperplasia with urinary obstruction   CAD S/P percutaneous coronary angioplasty   Essential hypertension   Physical deconditioning   Iron deficiency anemia due to chronic blood loss   Hypokalemia   Altered mental status   Stroke [facility])   Assessment and Plan:  Stroke [facility]) Per MRI of the brain read left parietal lobe and possibly left caudate Neurology has been consulted Hydralazine 5 mg IV every 6 hours as needed for SBP greater than 190, 24 hours ordered  Altered mental status Etiology workup in progress Check B12 level, UA, MRI of the brain without contrast CT head without contrast was read as: Hypodensity in the left frontal lobe, new from prior exam but technically age-indeterminate Hydralazine 5 mg IV every 8 hours as needed for SBP greater than 190, 4 days ordered Home antihypertensive medication: Amlodipine 5 mg daily, losartan 100 mg daily were not resumed on admission due to possible new stroke and possible need of permissive hypertension  Hypokalemia Check serum magnesium Status post sodium chloride 1 L bolus per EDP Potassium chloride 30 mill equivalent p.o. one-time dose ordered LR 125 mL/h, 1 day ordered BMP in the a.m.  Essential hypertension Home amlodipine 5 mg daily, losartan 100 mg daily not resumed on admission  Chart reviewed.   DVT prophylaxis: heparin 5000 units subcutaneous every 8 hours Code Status: full code Diet: Heart healthy Family Communication: spoke with  Disposition Plan: Pending clinical course Consults called: neurology  Admission status: telemetry medical, observation  Past Medical History:  Diagnosis Date   Adenoma of right adrenal gland    Arthritis    Benign prostatic hypertrophy with lower urinary tract symptoms (LUTS)    Bladder calculi    Bradycardia    CAD (coronary artery disease)    Carotid stenosis    Coronary artery disease    Dysrhythmia    bradycardia   Elevated PSA     GERD (gastroesophageal reflux disease)    Gross hematuria    Hyperlipidemia    Hypertension    Prostatitis    Shortness of breath dyspnea    Urinary frequency    Past Surgical History:  Procedure Laterality Date   CAROTID ENDARTERECTOMY Left    CYSTOSCOPY WITH LITHOLAPAXY N/A 04/13/2015   Procedure: CYSTOSCOPY WITH LITHOLAPAXY;  Surgeon: Hollice Espy, MD;  Location: [facility] ORS;  Service: Urology;  Laterality: N/A;   Social History:  reports that he quit smoking about 17 years ago. His smoking use included cigarettes. He has never used smokeless tobacco. He reports that he does not drink alcohol and does not use drugs.  Allergies  Allergen Reactions   Lisinopril Cough   Family History  Problem Relation Age of Onset   Kidney cancer Father    Kidney cancer Paternal Grandfather    Alzheimer's disease Mother    Family history: Family history reviewed and not pertinent  Prior to Admission medications   Medication Sig Start Date End Date Taking? Authorizing Provider  amLODipine (NORVASC) 5 MG tablet Take 1 tablet (5 mg total) by mouth daily. Patient not taking: Reported on 01/24/2023 05/14/22 08/12/22  [name], MD  losartan (COZAAR) 100 MG tablet Take 1 tablet (100 mg total) by mouth daily. Patient not taking: Reported on 01/24/2023 05/14/22   [name], MD   Physical Exam: Vitals:   01/24/23 1318 01/24/23 1330  BP: (!) 222/80 (!) 218/74  Pulse: 60 (!) 57  Resp: 18 14  Temp: 97.7 F (36.5 C)   TempSrc: Oral   SpO2: 99% 100%   Constitutional: appears age-appropriate, frail, NAD, calm, comfortable Eyes: PERRL, lids and conjunctivae normal ENMT: Mucous membranes are moist. Posterior pharynx clear of any exudate or lesions. Age-appropriate dentition. Hearing appropriate Neck: normal, supple, no masses, no thyromegaly Respiratory: clear to auscultation bilaterally, no wheezing, no crackles. Normal respiratory effort. No accessory muscle use.  Cardiovascular: Regular rate and  rhythm, no murmurs / rubs / gallops. No extremity edema. 2+ pedal pulses. No carotid bruits.  Abdomen: no tenderness, no masses palpated, no hepatosplenomegaly. Bowel sounds positive.  Musculoskeletal: no clubbing / cyanosis. No joint deformity upper and lower extremities. Good ROM, no contractures, no atrophy. Normal muscle tone.  Skin: no rashes, lesions, ulcers. No induration.  Chronic stuck on skin changes on his back.  Tattoo on his left arm appears old and well-healed. Neurologic: Sensation intact. Strength 5/5 in all 4.  Psychiatric: Lacks judgment and insight. Alert and oriented x self and age.  Confused mood.   EKG: Ordered pending completion  Chest x-ray on Admission: I personally reviewed and I agree with radiologist reading as below.  MR BRAIN WO CONTRAST  Result Date: 01/24/2023 CLINICAL DATA:  Altered mental status EXAM: MRI HEAD WITHOUT CONTRAST TECHNIQUE: Multiplanar, multiecho pulse sequences of the brain and surrounding structures were obtained without intravenous contrast. COMPARISON:  05/13/2019 MRI head, correlation is also made with 01/24/2023 CT head FINDINGS: Brain: Punctate foci of restricted diffusion with possible ADC correlate in the left parietal  lobe (series 2, images 42-46), with an additional possible punctate focus left caudate (series 2, image 33). No acute hemorrhage, mass, mass effect, or midline shift. No hydrocephalus or extra-axial collection. Normal craniocervical junction. Advanced, age related cerebral atrophy, most prominent in the frontoparietal region. Encephalomalacia in the left frontal lobe correlates with the hypodensity on the same-day CT, likely sequela of remote infarct. Confluent T2 hyperintense signal in the periventricular white matter, likely the sequela of severe chronic small vessel ischemic disease. Vascular: Normal arterial flow voids. Skull and upper cervical spine: Normal marrow signal. Sinuses/Orbits: Clear paranasal sinuses. Status post  bilateral lens replacements. Other: The mastoids are well aerated. T2 hyperintense lesion in the right posterior neck soft tissues, which measures up to 3.3 cm, most likely a subcutaneous cyst. IMPRESSION: Punctate foci of restricted diffusion in the left parietal lobe and possibly the left caudate, concerning for acute or subacute infarcts. These results will be called to the ordering clinician or representative by the Radiologist Assistant, and communication documented in the PACS or [employer]. Electronically Signed   By: Merilyn Baba M.D.   On: 01/24/2023 16:49   CT HEAD WO CONTRAST (5MM)  Result Date: 01/24/2023 CLINICAL DATA:  Altered mental status EXAM: CT HEAD WITHOUT CONTRAST TECHNIQUE: Contiguous axial images were obtained from the base of the skull through the vertex without intravenous contrast. RADIATION DOSE REDUCTION: This exam was performed according to the departmental dose-optimization program which includes automated exposure control, adjustment of the mA and/or kV according to patient size and/or use of iterative reconstruction technique. COMPARISON:  05/08/2022 FINDINGS: Brain: Hypodensity in the left frontal lobe (series 2, image 19) is new from the prior exam but technically age indeterminate. No evidence of acute hemorrhage, mass, mass effect, or midline shift. No hydrocephalus or extra-axial fluid collection. Age related cerebral atrophy, with ex vacuo dilatation of the ventricles. Periventricular white matter changes, likely the sequela of chronic small vessel ischemic disease. Vascular: No hyperdense vessel. Atherosclerotic calcifications in the intracranial carotid and vertebral arteries. Skull: Negative for fracture or focal lesion. Sinuses/Orbits: Minimal mucosal thickening in the ethmoid air cells. No acute finding in the imaged orbits. Other: The mastoid air cells are well aerated. IMPRESSION: 1. Hypodensity in the left frontal lobe, new from the prior exam but technically  age indeterminate. MRI could be considered for further evaluation. 2. No acute intracranial hemorrhage. These results were called by telephone at the time of interpretation on 01/24/2023 at 2:40 pm to provider Duffy Bruce , who verbally acknowledged these results. Electronically Signed   By: Merilyn Baba M.D.   On: 01/24/2023 14:43   DG Chest Portable 1 View  Result Date: 01/24/2023 CLINICAL DATA:  Weakness. EXAM: PORTABLE CHEST 1 VIEW COMPARISON:  May 08, 2022. FINDINGS: Stable cardiomegaly. Both lungs are clear. The visualized skeletal structures are unremarkable. IMPRESSION: No active disease. Electronically Signed   By: Marijo Conception M.D.   On: 01/24/2023 14:28    Labs on Admission: I have personally reviewed following labs  CBC: Recent Labs  Lab 01/24/23 1323  WBC 5.4  HGB 11.5*  HCT 34.8*  MCV 95.9  PLT [id number]   Basic Metabolic Panel: Recent Labs  Lab 01/24/23 1323  NA 142  K 3.2*  CL 102  CO2 27  GLUCOSE 85  BUN 23  CREATININE 1.74*  CALCIUM 9.0   GFR: CrCl cannot be calculated (Unknown ideal weight.).  Liver Function Tests: Recent Labs  Lab 01/24/23 1323  AST 14*  ALT 9  ALKPHOS 46  BILITOT 1.3*  PROT 6.2*  ALBUMIN 3.6   Urine analysis:    Component Value Date/Time   COLORURINE YELLOW 01/24/2023 1336   APPEARANCEUR CLEAR (A) 01/24/2023 1336   APPEARANCEUR Clear 05/27/2015 1351   LABSPEC 1.025 01/24/2023 1336   PHURINE 6.0 01/24/2023 1336   GLUCOSEU NEGATIVE 01/24/2023 1336   HGBUR MODERATE (A) 01/24/2023 1336   BILIRUBINUR NEGATIVE 01/24/2023 1336   BILIRUBINUR Negative 05/27/2015 1351   KETONESUR 40 (A) 01/24/2023 1336   PROTEINUR >300 (A) 01/24/2023 1336   NITRITE NEGATIVE 01/24/2023 1336   LEUKOCYTESUR NEGATIVE 01/24/2023 1336   This document was prepared using Dragon Voice Recognition software and may include unintentional dictation errors.  Dr. Tobie Poet [facility]  If 7PM-7AM, please contact overnight-coverage provider If 7AM-7PM,  please contact day coverage provider [email or web address]  01/24/2023, 5:16 PM

## 2023-01-24 NOTE — Assessment & Plan Note (Addendum)
Per MRI of the brain read left parietal lobe and possibly left caudate Neurology has been consulted via secure chat and epic order Hydralazine 5 mg IV every 6 hours as needed for SBP greater than 190, 24 hours ordered

## 2023-01-24 NOTE — Hospital Course (Addendum)
Charles Sims is a 89 year old male with history of hypertension who presents emergency department for chief concerns of change in mentation.  Vitals in the ED showed temperature of 97.7, respiration rate of 14, heart rate of 57, blood pressure 218/over 74, SpO2 100% on room air.  Serum sodium is 142, potassium 3.2, chloride of 102, bicarb 27, BUN of 27, serum creatinine of 1.74, nonfasting blood glucose 85, WBC 5.4, hemoglobin 11.5, platelets of 286.  High sensitive troponin is 41.  UA has been ordered and collected.  ED treatment: Sodium chloride 1 L bolus.

## 2023-01-25 ENCOUNTER — Observation Stay
Admit: 2023-01-25 | Discharge: 2023-01-25 | Disposition: A | Discharge status: Home / Self Care | Payer: Medicare HMO | Attending: Internal Medicine | Admitting: Internal Medicine

## 2023-01-25 ENCOUNTER — Inpatient Hospital Stay: Payer: Medicare HMO

## 2023-01-25 DIAGNOSIS — Z7189 Other specified counseling: Secondary | ICD-10-CM

## 2023-01-25 DIAGNOSIS — F32A Depression, unspecified: Secondary | ICD-10-CM | POA: Diagnosis present

## 2023-01-25 DIAGNOSIS — I10 Essential (primary) hypertension: Secondary | ICD-10-CM | POA: Diagnosis present

## 2023-01-25 DIAGNOSIS — R4182 Altered mental status, unspecified: Secondary | ICD-10-CM

## 2023-01-25 DIAGNOSIS — R5381 Other malaise: Secondary | ICD-10-CM | POA: Diagnosis not present

## 2023-01-25 DIAGNOSIS — I2510 Atherosclerotic heart disease of native coronary artery without angina pectoris: Secondary | ICD-10-CM | POA: Diagnosis present

## 2023-01-25 DIAGNOSIS — Z87891 Personal history of nicotine dependence: Secondary | ICD-10-CM | POA: Diagnosis not present

## 2023-01-25 DIAGNOSIS — R29701 NIHSS score 1: Secondary | ICD-10-CM | POA: Diagnosis present

## 2023-01-25 DIAGNOSIS — Z820 Family history of epilepsy and other diseases of the nervous system: Secondary | ICD-10-CM | POA: Diagnosis not present

## 2023-01-25 DIAGNOSIS — Z8051 Family history of malignant neoplasm of kidney: Secondary | ICD-10-CM | POA: Diagnosis not present

## 2023-01-25 DIAGNOSIS — E440 Moderate protein-calorie malnutrition: Secondary | ICD-10-CM | POA: Diagnosis present

## 2023-01-25 DIAGNOSIS — Z79899 Other long term (current) drug therapy: Secondary | ICD-10-CM | POA: Diagnosis not present

## 2023-01-25 DIAGNOSIS — R531 Weakness: Secondary | ICD-10-CM | POA: Diagnosis not present

## 2023-01-25 DIAGNOSIS — R627 Adult failure to thrive: Secondary | ICD-10-CM | POA: Diagnosis present

## 2023-01-25 DIAGNOSIS — Z9861 Coronary angioplasty status: Secondary | ICD-10-CM | POA: Diagnosis not present

## 2023-01-25 DIAGNOSIS — E785 Hyperlipidemia, unspecified: Secondary | ICD-10-CM | POA: Diagnosis present

## 2023-01-25 DIAGNOSIS — I639 Cerebral infarction, unspecified: Secondary | ICD-10-CM | POA: Diagnosis present

## 2023-01-25 DIAGNOSIS — I6340 Cerebral infarction due to embolism of unspecified cerebral artery: Secondary | ICD-10-CM | POA: Diagnosis present

## 2023-01-25 DIAGNOSIS — Z888 Allergy status to other drugs, medicaments and biological substances status: Secondary | ICD-10-CM | POA: Diagnosis not present

## 2023-01-25 DIAGNOSIS — E876 Hypokalemia: Secondary | ICD-10-CM | POA: Diagnosis present

## 2023-01-25 DIAGNOSIS — D500 Iron deficiency anemia secondary to blood loss (chronic): Secondary | ICD-10-CM | POA: Diagnosis present

## 2023-01-25 DIAGNOSIS — N138 Other obstructive and reflux uropathy: Secondary | ICD-10-CM | POA: Diagnosis present

## 2023-01-25 DIAGNOSIS — Z6822 Body mass index (BMI) 22.0-22.9, adult: Secondary | ICD-10-CM | POA: Diagnosis not present

## 2023-01-25 DIAGNOSIS — I6389 Other cerebral infarction: Secondary | ICD-10-CM | POA: Diagnosis present

## 2023-01-25 DIAGNOSIS — K219 Gastro-esophageal reflux disease without esophagitis: Secondary | ICD-10-CM | POA: Diagnosis present

## 2023-01-25 DIAGNOSIS — N401 Enlarged prostate with lower urinary tract symptoms: Secondary | ICD-10-CM | POA: Diagnosis present

## 2023-01-25 LAB — CBC WITH DIFFERENTIAL/PLATELET
Abs Immature Granulocytes: 0.03 10*3/uL (ref 0.00–0.07)
Basophils Absolute: 0 10*3/uL (ref 0.0–0.1)
Basophils Relative: 0 %
Eosinophils Absolute: 0.1 10*3/uL (ref 0.0–0.5)
Eosinophils Relative: 1 %
HCT: 33.8 % — ABNORMAL LOW (ref 39.0–52.0)
Hemoglobin: 11.2 g/dL — ABNORMAL LOW (ref 13.0–17.0)
Immature Granulocytes: 0 %
Lymphocytes Relative: 39 %
Lymphs Abs: 2.9 10*3/uL (ref 0.7–4.0)
MCH: 31.3 pg (ref 26.0–34.0)
MCHC: 33.1 g/dL (ref 30.0–36.0)
MCV: 94.4 fL (ref 80.0–100.0)
Monocytes Absolute: 0.5 10*3/uL (ref 0.1–1.0)
Monocytes Relative: 6 %
Neutro Abs: 4 10*3/uL (ref 1.7–7.7)
Neutrophils Relative %: 54 %
Platelets: 306 10*3/uL (ref 150–400)
RBC: 3.58 MIL/uL — ABNORMAL LOW (ref 4.22–5.81)
RDW: 13.8 % (ref 11.5–15.5)
WBC: 7.6 10*3/uL (ref 4.0–10.5)
nRBC: 0 % (ref 0.0–0.2)

## 2023-01-25 LAB — LIPID PANEL
Cholesterol: 168 mg/dL (ref 0–200)
HDL: 36 mg/dL — ABNORMAL LOW (ref >40)
LDL Cholesterol: 115 mg/dL — ABNORMAL HIGH (ref 0–99)
Total CHOL/HDL Ratio: 4.7 RATIO
Triglycerides: 83 mg/dL (ref <150)
VLDL: 17 mg/dL (ref 0–40)

## 2023-01-25 LAB — ECHOCARDIOGRAM COMPLETE
Area-P 1/2: 1.74 cm2
S' Lateral: 3 cm

## 2023-01-25 LAB — BASIC METABOLIC PANEL
Anion gap: 13 (ref 5–15)
BUN: 20 mg/dL (ref 8–23)
CO2: 24 mmol/L (ref 22–32)
Calcium: 8.7 mg/dL — ABNORMAL LOW (ref 8.9–10.3)
Chloride: 102 mmol/L (ref 98–111)
Creatinine, Ser: 1.55 mg/dL — ABNORMAL HIGH (ref 0.61–1.24)
GFR, Estimated: 42 mL/min — ABNORMAL LOW (ref >60)
Glucose, Bld: 107 mg/dL — ABNORMAL HIGH (ref 70–99)
Potassium: 3.3 mmol/L — ABNORMAL LOW (ref 3.5–5.1)
Sodium: 139 mmol/L (ref 135–145)

## 2023-01-25 LAB — VITAMIN B12: Vitamin B-12: 451 pg/mL (ref 180–914)

## 2023-01-25 MED ORDER — HYDRALAZINE HCL 20 MG/ML IJ SOLN
5.0000 mg | Freq: Four times a day (QID) | INTRAMUSCULAR | Status: AC | PRN
  Administered 2023-01-25 – 2023-01-26 (×2): 5 mg via INTRAVENOUS
  Filled 2023-01-25: qty 1

## 2023-01-25 MED ORDER — IOHEXOL 350 MG/ML SOLN
60.0000 mL | Freq: Once | INTRAVENOUS | Status: AC | PRN
  Administered 2023-01-25: 60 mL via INTRAVENOUS

## 2023-01-25 MED ORDER — POTASSIUM CHLORIDE CRYS ER 20 MEQ PO TBCR
40.0000 meq | EXTENDED_RELEASE_TABLET | Freq: Once | ORAL | Status: AC
  Administered 2023-01-25: 40 meq via ORAL
  Filled 2023-01-25: qty 2

## 2023-01-25 MED ORDER — ATORVASTATIN CALCIUM 20 MG PO TABS
80.0000 mg | ORAL_TABLET | Freq: Every day | ORAL | Status: DC
  Administered 2023-01-25 – 2023-01-28 (×4): 80 mg via ORAL
  Filled 2023-01-25 (×4): qty 4

## 2023-01-25 MED ORDER — ASPIRIN 81 MG PO TBEC
81.0000 mg | DELAYED_RELEASE_TABLET | Freq: Every day | ORAL | Status: DC
  Administered 2023-01-25 – 2023-01-28 (×4): 81 mg via ORAL
  Filled 2023-01-25 (×4): qty 1

## 2023-01-25 NOTE — Progress Notes (Signed)
*  PRELIMINARY RESULTS* Echocardiogram 2D Echocardiogram has been performed.  Charles Sims 01/25/2023, 8:43 AM

## 2023-01-25 NOTE — Progress Notes (Signed)
[facility]                                                                               Charles Sims, is a 89 y.o. male, DOB - 09-08-1930, [id number] Admit date - 01/24/2023    Outpatient Primary MD for the patient is Tonia Ghent, MD  LOS - 0  days    Brief summary   Charles Sims is a 89 year old male with history of hypertension who presents emergency department for chief concerns of change in mentation.  MRI brain without contrast shows Punctate foci of restricted diffusion in the left parietal lobe and possibly the left caudate, concerning for acute or subacute infarcts  Assessment & Plan    Assessment and Plan: Left Parietal and left caudate stroke Neurology consulted and recommendations given.  Continue with aspirin 81  mg daily, added lipitor.  MRI brain reviewed.  CTA head and neck.  Lipid panel showed LDL is 115.  Hemoglobin A1c is pending.  Therapy evals are pending.  Echocardiogram ordered.  SLP eval pending.   Acute encephalopathy;from acute stroke and ? Dementia.  He is alert and answering questions appropriately.   Hypokalemia Replaced.   Essential hypertension Well controlled. Restarted home meds.   Mild normocytic anemia:  Hemoglobin stable around 11.       Estimated body mass index is 25.11 kg/m as calculated from the following:   Height as of 06/18/22: 5\' 10"  (1.778 m).   Weight as of 06/18/22: 79.4 kg.  Code Status: full code.  DVT Prophylaxis:  SCD's Start: 01/24/23 1514   Level of Care: Level of care: Telemetry Medical Family Communication: None at bedside.   Disposition Plan:     Remains inpatient appropriate:  pending therapy eval.   Procedures:  MRI brain.   Consultants:   Neurology.   Antimicrobials:   Anti-infectives (From admission, onward)    None        Medications  Scheduled Meds:  aspirin EC  81 mg Oral Daily   atorvastatin  80 mg Oral Daily   potassium chloride  40 mEq Oral Once    Continuous Infusions:  lactated ringers 125 mL/hr at 01/25/23 1402   PRN Meds:.acetaminophen **OR** acetaminophen (TYLENOL) oral liquid 160 mg/5 mL **OR** acetaminophen, hydrALAZINE, polyethylene glycol, senna-docusate    Subjective:   Charles Sims was seen and examined today. No new complaints.   Objective:   Vitals:   01/24/23 2224 01/25/23 0602 01/25/23 0900 01/25/23 1200  BP: (!) 164/77 (!) 161/145 (!) 165/78 (!) 138/115  Pulse: 61 68 60 (!) 58  Resp: 20 20 18 20   Temp: 98 F (36.7 C) 98.6 F (37 C) 98.5 F (36.9 C) 98.5 F (36.9 C)  TempSrc: Oral     SpO2: 100% 100% 100% 100%    Intake/Output Summary (Last 24 hours) at 01/25/2023 1423 Last data filed at 01/25/2023 1402 Gross per 24 hour  Intake 3263.31 ml  Output 350 ml  Net 2913.31 ml   There were no vitals filed for this visit.   Exam General exam: Appears calm and comfortable  Respiratory system: Clear to auscultation. Respiratory effort normal. Cardiovascular system: S1 & S2 heard,  RRR. No JVD, murmurs,  Gastrointestinal system: Abdomen is nondistended, soft and nontender.  Central nervous system: Alert and oriented. No focal neurological deficits. Extremities: Symmetric 5 x 5 power. Skin: No rashes, lesions or ulcers Psychiatry:  Mood & affect appropriate.    Data Reviewed:  I have personally reviewed following labs and imaging studies   CBC Lab Results  Component Value Date   WBC 7.6 01/25/2023   RBC 3.58 (L) 01/25/2023   HGB 11.2 (L) 01/25/2023   HCT 33.8 (L) 01/25/2023   MCV 94.4 01/25/2023   MCH 31.3 01/25/2023   PLT 306 01/25/2023   MCHC 33.1 01/25/2023   RDW 13.8 01/25/2023   LYMPHSABS 2.9 01/25/2023   MONOABS 0.5 01/25/2023   EOSABS 0.1 01/25/2023   BASOSABS 0.0 [id number]     Last metabolic panel Lab Results  Component Value Date   NA 139 01/25/2023   K 3.3 (L) 01/25/2023   CL 102 01/25/2023   CO2 24 01/25/2023   BUN 20 01/25/2023   CREATININE 1.55 (H) 01/25/2023    GLUCOSE 107 (H) 01/25/2023   GFRNONAA 42 (L) 01/25/2023   GFRAA 44 (L) 05/16/2019   CALCIUM 8.7 (L) 01/25/2023   PROT 6.2 (L) 01/24/2023   ALBUMIN 3.6 01/24/2023   BILITOT 1.3 (H) 01/24/2023   ALKPHOS 46 01/24/2023   AST 14 (L) 01/24/2023   ALT 9 01/24/2023   ANIONGAP 13 01/25/2023    CBG (last 3)  No results for input(s): "GLUCAP" in the last 72 hours.    Coagulation Profile: No results for input(s): "INR", "PROTIME" in the last 168 hours.   Radiology Studies: MR BRAIN WO CONTRAST  Result Date: 01/24/2023 CLINICAL DATA:  Altered mental status EXAM: MRI HEAD WITHOUT CONTRAST TECHNIQUE: Multiplanar, multiecho pulse sequences of the brain and surrounding structures were obtained without intravenous contrast. COMPARISON:  05/13/2019 MRI head, correlation is also made with 01/24/2023 CT head FINDINGS: Brain: Punctate foci of restricted diffusion with possible ADC correlate in the left parietal lobe (series 2, images 42-46), with an additional possible punctate focus left caudate (series 2, image 33). No acute hemorrhage, mass, mass effect, or midline shift. No hydrocephalus or extra-axial collection. Normal craniocervical junction. Advanced, age related cerebral atrophy, most prominent in the frontoparietal region. Encephalomalacia in the left frontal lobe correlates with the hypodensity on the same-day CT, likely sequela of remote infarct. Confluent T2 hyperintense signal in the periventricular white matter, likely the sequela of severe chronic small vessel ischemic disease. Vascular: Normal arterial flow voids. Skull and upper cervical spine: Normal marrow signal. Sinuses/Orbits: Clear paranasal sinuses. Status post bilateral lens replacements. Other: The mastoids are well aerated. T2 hyperintense lesion in the right posterior neck soft tissues, which measures up to 3.3 cm, most likely a subcutaneous cyst. IMPRESSION: Punctate foci of restricted diffusion in the left parietal lobe and possibly  the left caudate, concerning for acute or subacute infarcts. These results will be called to the ordering clinician or representative by the Radiologist Assistant, and communication documented in the PACS or [employer]. Electronically Signed   By: Merilyn Baba M.D.   On: 01/24/2023 16:49   CT HEAD WO CONTRAST (5MM)  Result Date: 01/24/2023 CLINICAL DATA:  Altered mental status EXAM: CT HEAD WITHOUT CONTRAST TECHNIQUE: Contiguous axial images were obtained from the base of the skull through the vertex without intravenous contrast. RADIATION DOSE REDUCTION: This exam was performed according to the departmental dose-optimization program which includes automated exposure control, adjustment of the mA and/or kV according  to patient size and/or use of iterative reconstruction technique. COMPARISON:  05/08/2022 FINDINGS: Brain: Hypodensity in the left frontal lobe (series 2, image 19) is new from the prior exam but technically age indeterminate. No evidence of acute hemorrhage, mass, mass effect, or midline shift. No hydrocephalus or extra-axial fluid collection. Age related cerebral atrophy, with ex vacuo dilatation of the ventricles. Periventricular white matter changes, likely the sequela of chronic small vessel ischemic disease. Vascular: No hyperdense vessel. Atherosclerotic calcifications in the intracranial carotid and vertebral arteries. Skull: Negative for fracture or focal lesion. Sinuses/Orbits: Minimal mucosal thickening in the ethmoid air cells. No acute finding in the imaged orbits. Other: The mastoid air cells are well aerated. IMPRESSION: 1. Hypodensity in the left frontal lobe, new from the prior exam but technically age indeterminate. MRI could be considered for further evaluation. 2. No acute intracranial hemorrhage. These results were called by telephone at the time of interpretation on 01/24/2023 at 2:40 pm to provider Duffy Bruce , who verbally acknowledged these results. Electronically  Signed   By: Merilyn Baba M.D.   On: 01/24/2023 14:43   DG Chest Portable 1 View  Result Date: 01/24/2023 CLINICAL DATA:  Weakness. EXAM: PORTABLE CHEST 1 VIEW COMPARISON:  May 08, 2022. FINDINGS: Stable cardiomegaly. Both lungs are clear. The visualized skeletal structures are unremarkable. IMPRESSION: No active disease. Electronically Signed   By: Marijo Conception M.D.   On: 01/24/2023 14:28       Hosie Poisson M.D. [facility] 01/25/2023, 2:23 PM  Available via Epic secure chat 7am-7pm After 7 pm, please refer to night coverage provider listed on amion.

## 2023-01-25 NOTE — Evaluation (Signed)
Occupational Therapy Evaluation Patient Details Name: Charles Sims MRN: [id number] DOB: 1930/02/14 Today's Date: 01/25/2023   History of Present Illness Pt is 89 y/o male with PHM including CAD, CVA, chest pain, syncope, PNA, AMS. Here with acute CVA. At home, pt was having weakness and AMS (grandson does report baseline dementia as well), decreased PO and medication intake, decreased communication. Of note, wife currently has hip fx and is in short term rehab and this appears to have affected pt as well.   Clinical Impression   Patient received for OT evaluation. See flowsheet below for details of function. Generally, patient requiring CGA and cues for bed mobility, CGA for functional mobility, and set up-MIN A for ADLs. Pt lethargic at beginning of session today; improved as session continued. Pt with baseline dementia; fluctuating orientation at baseline, per grandson. Very few words spoken today, but follows visual, verbal, and tactile cues well.   Patient will benefit from continued OT while in acute care.      Recommendations for follow up therapy are one component of a multi-disciplinary discharge planning process, led by the attending physician.  Recommendations may be updated based on patient status, additional functional criteria and insurance authorization.   Follow Up Recommendations  Home health OT ; recommend continue with 24/7 family support as per baseline.    Assistance Recommended at Discharge Frequent or constant Supervision/Assistance  Patient can return home with the following A little help with walking and/or transfers;A little help with bathing/dressing/bathroom;Assistance with cooking/housework;Direct supervision/assist for medications management;Direct supervision/assist for financial management;Assist for transportation;Help with stairs or ramp for entrance    Functional Status Assessment  Patient has had a recent decline in their functional status and demonstrates  the ability to make significant improvements in function in a reasonable and predictable amount of time.  Equipment Recommendations  None recommended by OT (has needed DME)    Recommendations for Other Services       Precautions / Restrictions Precautions Precautions: Fall Restrictions Weight Bearing Restrictions: No      Mobility Bed Mobility Overal bed mobility: Needs Assistance Bed Mobility: Rolling, Supine to Sit Rolling: Supervision, Min guard   Supine to sit: Min guard     General bed mobility comments: Lots of cues for use of bedrail for semi-reclined to sit, but pt strong and able to t/f without physical assist; lots of cues for cognitive assist sequencing the task.    Transfers Overall transfer level: Needs assistance Equipment used: 2 person hand held assist Transfers: Sit to/from Stand Sit to Stand: Min guard, Min assist           General transfer comment: On first stand, needing x2 handheld assist; anticipate this will be x1 assist in the future since it is now confirmed that he's able to stand.      Balance Overall balance assessment: Needs assistance Sitting-balance support: Feet supported Sitting balance-Leahy Scale: Good     Standing balance support: Bilateral upper extremity supported Standing balance-Leahy Scale: Fair Standing balance comment: Pt able to balance in standing without UE support statically (CGA for safety); once in motion requires BIL UE support for balance.                           ADL either performed or assessed with clinical judgement   ADL Overall ADL's : Needs assistance/impaired     Grooming: Set up;Oral care;Brushing hair;Bed level (bed in semi-reclined; OT providing items needed; cues to wipe  mouth with paper towel after brushing teeth.)                                 General ADL Comments: Anticipate pt will need more supervision and assistance with ADL tasks at this time compared to  baseline. Is moving slowly and needing cues. Once standing and stabilized, pt able to stand and use BIL UE for using urinal in standing position with CGA for safety. UE support for mobility.     Vision         Perception     Praxis      Pertinent Vitals/Pain Pain Assessment Pain Assessment: 0-10 Pain Score:  (appears to have mild pain with movement; pt unable to state where it is coming from. Grandson reports pt has back pain intermittently at baseline.) Pain Location: unknown; pt points to thighs. Pain Descriptors / Indicators:  (unable to state) Pain Intervention(s): Limited activity within patient's tolerance     Hand Dominance Right   Extremity/Trunk Assessment Upper Extremity Assessment Upper Extremity Assessment: Overall WFL for tasks assessed (Pt appears to be strong; able to pull self up in the bed with bed rail using BIL UE and bed in trendelenburg.)   Lower Extremity Assessment Lower Extremity Assessment: Defer to PT evaluation       Communication Communication Communication:  (Pt is very quiet; does not say much during session)   Cognition Arousal/Alertness: Lethargic (improved in alertness during session as time went on) Behavior During Therapy: Flat affect Overall Cognitive Status: Impaired/Different from baseline                                 General Comments: Pt is oriented to self. Is able to identify that his grandson is someone he knows and loves, but is unable to state their relationship or the grandson's name. When asked "is this your son or grandson?" pt states "no" and confirms that grandson is a member of the family but he does not know which one. Pt able to follow one step commands consistently throughout session. Pt with very few words this session. Able to do automatic task such as brushing teeth when set up with supplies. Pt pretty lethargic at beginning of session; improved alertness as session continued. Pt was able to state that he  needed to urinate and held urine until standing and urinal in place.     General Comments  Pt left walking with CGA with RW with PT into hallway. Improved balance with RW.    Exercises     Shoulder Instructions      Home Living Family/patient expects to be discharged to:: Private residence Living Arrangements: Spouse/significant other;Other relatives (adult kids and grandkids assisting 24/7 at this time, as pt's wife is currently in [place] following hip fx.) Available Help at Discharge: Family;Available 24 hours/day Type of Home: House Home Access: Ramped entrance     Home Layout: Two level;Able to live on main level with bedroom/bathroom     Bathroom Shower/Tub: Occupational psychologist: Standard     Home Equipment: Conservation officer, nature (2 wheels);Shower seat   Additional Comments: pt was not using RW at baseline      Prior Functioning/Environment Prior Level of Function : Needs assist  Cognitive Assist : ADLs (cognitive)   ADLs (Cognitive): Set up cues       Mobility Comments:  Pt has RW, but does not currently use. Yolanda Bonine denies falls. ADLs Comments: Family is currently providing 24/7 supervision. Assist with all IADLs (pt has dementia at baseline); pt is able to do ADLs without assistance per grandson. Yolanda Bonine denies pt falls.        OT Problem List: Decreased strength;Decreased activity tolerance;Impaired balance (sitting and/or standing);Decreased safety awareness      OT Treatment/Interventions: Self-care/ADL training;Therapeutic exercise;Patient/family education;Therapeutic activities    OT Goals(Current goals can be found in the care plan section) Acute Rehab OT Goals Patient Stated Goal: pt did not state a goal OT Goal Formulation: With patient/family Time For Goal Achievement: 02/08/23 Potential to Achieve Goals: Good ADL Goals Pt Will Perform Grooming: with modified independence;standing Pt Will Perform Lower Body Bathing: with modified  independence;sit to/from stand Pt Will Perform Lower Body Dressing: with modified independence;sit to/from stand Pt Will Transfer to Toilet: with modified independence;regular height toilet  OT Frequency: Min 3X/week    Co-evaluation PT/OT/SLP Co-Evaluation/Treatment: Yes (for portion of session) Reason for Co-Treatment: For patient/therapist safety   OT goals addressed during session: ADL's and self-care      AM-PAC OT "6 Clicks" Daily Activity     Outcome Measure Help from another person eating meals?: None Help from another person taking care of personal grooming?: A Little Help from another person toileting, which includes using toliet, bedpan, or urinal?: A Little Help from another person bathing (including washing, rinsing, drying)?: A Lot Help from another person to put on and taking off regular upper body clothing?: A Little Help from another person to put on and taking off regular lower body clothing?: A Lot 6 Click Score: 17   End of Session Equipment Utilized During Treatment: Gait belt;Rolling walker (2 wheels) Nurse Communication: Mobility status  Activity Tolerance: Patient tolerated treatment well Patient left:  (standing with RW with PT and grandson.)  OT Visit Diagnosis: Unsteadiness on feet (R26.81)                Time: [id number] OT Time Calculation (min): 36 min Charges:  OT General Charges $OT Visit: 1 Visit OT Evaluation $OT Eval Moderate Complexity: 1 Mod  Akeela Busk Carleene Mains, MS, OTR/L   Vania Rea 01/25/2023, 11:44 AM

## 2023-01-25 NOTE — Evaluation (Signed)
Physical Therapy Evaluation Patient Details Name: Charles Sims MRN: [id number] DOB: December 14, 1929 Today's Date: 01/25/2023  History of Present Illness  Pt is 89 y/o male with PHM including CAD, CVA, chest pain, syncope, PNA, AMS. Here with acute CVA. At home, pt was having weakness and AMS (grandson does report baseline dementia as well), decreased PO and medication intake, decreased communication. Of note, wife currently has hip fx and is in short term rehab and this appears to have affected pt as well.    Clinical Impression  Pt with flat affect initially, but was able to verbalize answers to questions better as session progressed.  Pt received with OT in room and continued session in order to assist with transfer.  Once in standing and starting to ambulate, PT resumed care while OT left.  Pt able to ambulate to the nursing station before becoming fatigued and requesting to return to the room.  Pt does have some difficulty processing instructions at times, but overall does well when utilizing non-verbal responses.  Pt left in bed with phlebotomist in room to draw blood.  Discussed d/c options with grandson and daughter and between family, they are able to provide 24/7 support which would be in the best interest of the pt at this time.         Recommendations for follow up therapy are one component of a multi-disciplinary discharge planning process, led by the attending physician.  Recommendations may be updated based on patient status, additional functional criteria and insurance authorization.  Follow Up Recommendations Home health PT      Assistance Recommended at Discharge Intermittent Supervision/Assistance  Patient can return home with the following  A little help with walking and/or transfers;A little help with bathing/dressing/bathroom;Assist for transportation;Help with stairs or ramp for entrance    Equipment Recommendations None recommended by PT  Recommendations for Other  Services       Functional Status Assessment Patient has had a recent decline in their functional status and demonstrates the ability to make significant improvements in function in a reasonable and predictable amount of time.     Precautions / Restrictions Precautions Precautions: Fall Restrictions Weight Bearing Restrictions: No      Mobility  Bed Mobility Overal bed mobility: Needs Assistance Bed Mobility: Rolling, Supine to Sit Rolling: Supervision, Min guard   Supine to sit: Min guard     General bed mobility comments: Lots of cues for use of bedrail for semi-reclined to sit, but pt strong and able to t/f without physical assist; lots of cues for cognitive assist sequencing the task.    Transfers Overall transfer level: Needs assistance Equipment used: 2 person hand held assist Transfers: Sit to/from Stand Sit to Stand: Min guard, Min assist           General transfer comment: On first stand, needing x2 handheld assist; anticipate this will be x1 assist in the future since it is now confirmed that he's able to stand.    Ambulation/Gait Ambulation/Gait assistance: Min guard Gait Distance (Feet): 60 Feet Assistive device: Rolling walker (2 wheels) Gait Pattern/deviations: Step-through pattern, Decreased step length - right, Decreased step length - left, Trunk flexed Gait velocity: decreased     General Gait Details: Pt with shortened steps, but otherwise safe in mobility.  Forward flexed posture as well.  Stairs            Wheelchair Mobility    Modified Rankin (Stroke Patients Only)       Balance Overall balance  assessment: Needs assistance Sitting-balance support: Feet supported Sitting balance-Leahy Scale: Good     Standing balance support: Bilateral upper extremity supported Standing balance-Leahy Scale: Fair Standing balance comment: Pt able to balance in standing without UE support statically (CGA for safety); once in motion requires BIL  UE support for balance.                             Pertinent Vitals/Pain      Home Living Family/patient expects to be discharged to:: Private residence Living Arrangements: Spouse/significant other;Other relatives (adult kids and grandkids assisting 24/7 at this time, as pt's wife is currently in [place] following hip fx.) Available Help at Discharge: Family;Available 24 hours/day Type of Home: House Home Access: Ramped entrance       Home Layout: Two level;Able to live on main level with bedroom/bathroom Home Equipment: Rolling Walker (2 wheels);Shower seat Additional Comments: pt was not using RW at baseline    Prior Function Prior Level of Function : Needs assist  Cognitive Assist : ADLs (cognitive)   ADLs (Cognitive): Set up cues       Mobility Comments: Pt has RW, but does not currently use. Yolanda Bonine denies falls. ADLs Comments: Family is currently providing 24/7 supervision. Assist with all IADLs (pt has dementia at baseline); pt is able to do ADLs without assistance per grandson. Yolanda Bonine denies pt falls.     Hand Dominance   Dominant Hand: Right    Extremity/Trunk Assessment   Upper Extremity Assessment Upper Extremity Assessment: Overall WFL for tasks assessed (Pt appears to be strong; able to pull self up in the bed with bed rail using BIL UE and bed in trendelenburg.)    Lower Extremity Assessment Lower Extremity Assessment: Defer to PT evaluation       Communication   Communication:  (Pt is very quiet; does not say much during session)  Cognition Arousal/Alertness: Lethargic (improved in alertness during session as time went on) Behavior During Therapy: Flat affect Overall Cognitive Status: Impaired/Different from baseline                                 General Comments: Pt is oriented to self. Is able to identify that his grandson is someone he knows and loves, but is unable to state their relationship or the grandson's name.  When asked "is this your son or grandson?" pt states "no" and confirms that grandson is a member of the family but he does not know which one. Pt able to follow one step commands consistently throughout session. Pt with very few words this session. Able to do automatic task such as brushing teeth when set up with supplies. Pt pretty lethargic at beginning of session; improved alertness as session continued. Pt was able to state that he needed to urinate and held urine until standing and urinal in place.        General Comments General comments (skin integrity, edema, etc.): Pt left walking with CGA with RW with PT into hallway. Improved balance with RW.    Exercises     Assessment/Plan    PT Assessment Patient needs continued PT services  PT Problem List Decreased strength;Decreased activity tolerance;Decreased balance;Decreased mobility;Decreased safety awareness;Decreased knowledge of use of DME       PT Treatment Interventions Gait training;Stair training;Functional mobility training;DME instruction;Therapeutic activities;Therapeutic exercise;Balance training;Neuromuscular re-education;Patient/family education    PT Goals (Current  goals can be found in the Care Plan section)  Acute Rehab PT Goals Patient Stated Goal: not verbalized. PT Goal Formulation: With patient Time For Goal Achievement: 02/08/23 Potential to Achieve Goals: Good    Frequency Min 2X/week     Co-evaluation   Reason for Co-Treatment: For patient/therapist safety   OT goals addressed during session: ADL's and self-care       AM-PAC PT "6 Clicks" Mobility  Outcome Measure Help needed turning from your back to your side while in a flat bed without using bedrails?: A Little Help needed moving from lying on your back to sitting on the side of a flat bed without using bedrails?: A Little Help needed moving to and from a bed to a chair (including a wheelchair)?: A Little Help needed standing up from a chair  using your arms (e.g., wheelchair or bedside chair)?: A Little Help needed to walk in hospital room?: A Little Help needed climbing 3-5 steps with a railing? : A Lot 6 Click Score: 17    End of Session Equipment Utilized During Treatment: Gait belt Activity Tolerance: Patient tolerated treatment well Patient left: in bed;with call bell/phone within reach;with family/visitor present;with nursing/sitter in room Nurse Communication: Mobility status PT Visit Diagnosis: Unsteadiness on feet (R26.81);Other abnormalities of gait and mobility (R26.89);Muscle weakness (generalized) (M62.81);History of falling (Z91.81);Difficulty in walking, not elsewhere classified (R26.2);Pain Pain - Right/Left:  (Unable to determine.) Pain - part of body: Leg (Back pain as well.)    Time: 1106-1130 PT Time Calculation (min) (ACUTE ONLY): 24 min   Charges:   PT Evaluation $PT Eval Low Complexity: 1 Low PT Treatments $Gait Training: 8-22 mins        Gwenlyn Saran, PT, DPT Physical Therapist - [facility]  01/25/23, 2:14 PM

## 2023-01-25 NOTE — Progress Notes (Addendum)
SLP Cancellation Note  Patient Details Name: MORDEKAI WILLE MRN: [id number] DOB: [birth year]   Cancelled treatment:       Reason Eval/Treat Not Completed: Patient at procedure or test/unavailable (pt still working w/ OT. Not overly engaging verbally. Baseline Dementia?) ST services will f/u tomorrow for assessment. No reports of any swallowing issues per NSG report; pt is on a regular diet consistency.    [name], MS, CCC-SLP Speech Language Pathologist Rehab Services; [place] [phone] (ascom) Verlon Carcione 01/25/2023, 1:03 PM

## 2023-01-25 NOTE — Consult Note (Signed)
Consultation Note Date: 01/25/2023   Patient Name: Charles Sims  DOB: [birth year]  MRN: [id number]  Age / Sex: 89 y.o., male  PCP: Tonia Ghent, MD Referring Physician: Hosie Poisson, MD  Reason for Consultation: Establishing goals of care  HPI/Patient Profile: Charles Sims is a 89 year old male with history of hypertension who presents emergency department for chief concerns of change in mentation.   Vitals in the ED showed temperature of 97.7, respiration rate of 14, heart rate of 57, blood pressure 218/over 74, SpO2 100% on room air.   Serum sodium is 142, potassium 3.2, chloride of 102, bicarb 27, BUN of 27, serum creatinine of 1.74, nonfasting blood glucose 85, WBC 5.4, hemoglobin 11.5, platelets of 286.  Clinical Assessment and Goals of Care:   Notes and labs reviewed. In to see patient. He is sitting in bed, no family at bedside.  He is able to tell me his name but is otherwise confused and unable to answer questions.  Doristine Bosworth is outside of room, and she states that patient's wife is being discharged from skilled nursing following surgery from a fall.  She states the family is struggling with decisions.  Spoke with daughter via phone.  She states that she has a brother and she stays with her parents during the day and her brother stays with their parents at night.  She confirms her mother status.  She states at baseline patient is able to converse and talk.  She states sometimes he does not recognize people, and other times he does.  She states that over the past few days to a week he has been sleeping more, with a poor appetite.   We discussed his diagnosis, prognosis, GOC, EOL wishes disposition and options.  Created space and opportunity for patient  to explore thoughts and feelings regarding current medical information.   A detailed discussion was had today regarding advanced directives.   Concepts specific to code status, artifical feeding and hydration, IV antibiotics and rehospitalization were discussed.  The difference between an aggressive medical intervention path and a comfort care path was discussed.  Values and goals of care important to patient and family were attempted to be elicited.  Discussed limitations of medical interventions to prolong quality of life in some situations and discussed the concept of human mortality.  Daughter states her mother is patient's primary H POA, and she self is secondary POA.  She states she believes her father would want full code/full scope treatment, except for feeding tube.  We discussed outcomes of various interventions, and she does state that quality of life is important.  Offered follow-up over the weekend however daughter states she would like for me to touch base with her again Tuesday on my return to service.  SUMMARY OF RECOMMENDATIONS   Full code.  No feeding tube.  Prognosis:  Unable to determine      Primary Diagnoses: Present on Admission:  Essential hypertension  Iron deficiency anemia due to chronic blood loss  Physical deconditioning  Benign prostatic  hyperplasia with urinary obstruction  Stroke (cerebrum) ([place])   I have reviewed the medical record, interviewed the patient and family, and examined the patient. The following aspects are pertinent.  Past Medical History:  Diagnosis Date   Adenoma of right adrenal gland    Arthritis    Benign prostatic hypertrophy with lower urinary tract symptoms (LUTS)    Bladder calculi    Bradycardia    CAD (coronary artery disease)    Carotid stenosis    Coronary artery disease    Dysrhythmia    bradycardia   Elevated PSA    GERD (gastroesophageal reflux disease)    Gross hematuria    Hyperlipidemia    Hypertension    Prostatitis    Shortness of breath dyspnea    Urinary frequency    Social History   Socioeconomic History   Marital status: Married     Spouse name: Not on file   Number of children: Not on file   Years of education: Not on file   Highest education level: Not on file  Occupational History   Not on file  Tobacco Use   Smoking status: Former    Types: Cigarettes    Quit date: 04/04/2005    Years since quitting: 17.8   Smokeless tobacco: Never  Substance and Sexual Activity   Alcohol use: No   Drug use: No   Sexual activity: Not Currently  Other Topics Concern   Not on file  Social History Narrative   Not on file   Social Determinants of Health   Financial Resource Strain: Not on file  Food Insecurity: Not on file  Transportation Needs: No Transportation Needs (05/21/2019)   PRAPARE - Hydrologist (Medical): No    Lack of Transportation (Non-Medical): No  Physical Activity: Not on file  Stress: Not on file  Social Connections: Not on file   Family History  Problem Relation Age of Onset   Kidney cancer Father    Kidney cancer Paternal Grandfather    Alzheimer's disease Mother    Scheduled Meds:  aspirin EC  81 mg Oral Daily   atorvastatin  80 mg Oral Daily   potassium chloride  40 mEq Oral Once   Continuous Infusions:  lactated ringers 125 mL/hr at 01/25/23 1402   PRN Meds:.acetaminophen **OR** acetaminophen (TYLENOL) oral liquid 160 mg/5 mL **OR** acetaminophen, hydrALAZINE, polyethylene glycol, senna-docusate Medications Prior to Admission:  Prior to Admission medications   Medication Sig Start Date End Date Taking? Authorizing Provider  amLODipine (NORVASC) 5 MG tablet Take 1 tablet (5 mg total) by mouth daily. Patient not taking: Reported on 01/24/2023 05/14/22 08/12/22  [name], MD  losartan (COZAAR) 100 MG tablet Take 1 tablet (100 mg total) by mouth daily. Patient not taking: Reported on 01/24/2023 05/14/22   [name], MD   Allergies  Allergen Reactions   Lisinopril Cough   Review of Systems  All other systems reviewed and are negative.   Physical  Exam Pulmonary:     Effort: Pulmonary effort is normal.  Neurological:     Mental Status: He is alert.     Vital Signs: BP (!) 138/115 (BP Location: Right Arm)   Pulse (!) 58   Temp 98.5 F (36.9 C)   Resp 20   SpO2 100%  Pain Scale: PAINAD   Pain Score: 0-No pain   SpO2: SpO2: 100 % O2 Device:SpO2: 100 % O2 Flow Rate: .   IO: Intake/output summary:  Intake/Output  Summary (Last 24 hours) at 01/25/2023 1436 Last data filed at 01/25/2023 1402 Gross per 24 hour  Intake 3263.31 ml  Output 350 ml  Net 2913.31 ml    LBM:   Baseline Weight:   Most recent weight:       Signed by: Asencion Gowda, NP   Please contact Palliative Medicine Team phone at [phone] for questions and concerns.  For individual provider: See Shea Evans

## 2023-01-26 DIAGNOSIS — R4182 Altered mental status, unspecified: Secondary | ICD-10-CM | POA: Diagnosis not present

## 2023-01-26 DIAGNOSIS — R5381 Other malaise: Secondary | ICD-10-CM | POA: Diagnosis not present

## 2023-01-26 DIAGNOSIS — R531 Weakness: Secondary | ICD-10-CM | POA: Diagnosis not present

## 2023-01-26 DIAGNOSIS — E876 Hypokalemia: Secondary | ICD-10-CM | POA: Diagnosis not present

## 2023-01-26 LAB — BASIC METABOLIC PANEL
Anion gap: 9 (ref 5–15)
BUN: 18 mg/dL (ref 8–23)
CO2: 26 mmol/L (ref 22–32)
Calcium: 8.6 mg/dL — ABNORMAL LOW (ref 8.9–10.3)
Chloride: 105 mmol/L (ref 98–111)
Creatinine, Ser: 1.5 mg/dL — ABNORMAL HIGH (ref 0.61–1.24)
GFR, Estimated: 44 mL/min — ABNORMAL LOW (ref >60)
Glucose, Bld: 93 mg/dL (ref 70–99)
Potassium: 3.5 mmol/L (ref 3.5–5.1)
Sodium: 140 mmol/L (ref 135–145)

## 2023-01-26 MED ORDER — LOSARTAN POTASSIUM 25 MG PO TABS
25.0000 mg | ORAL_TABLET | Freq: Every day | ORAL | Status: DC
  Administered 2023-01-26 – 2023-01-28 (×3): 25 mg via ORAL
  Filled 2023-01-26 (×3): qty 1

## 2023-01-26 MED ORDER — FLUOXETINE HCL 20 MG PO CAPS
20.0000 mg | ORAL_CAPSULE | Freq: Every day | ORAL | Status: DC
  Administered 2023-01-26 – 2023-01-28 (×3): 20 mg via ORAL
  Filled 2023-01-26 (×3): qty 1

## 2023-01-26 MED ORDER — AMLODIPINE BESYLATE 10 MG PO TABS
10.0000 mg | ORAL_TABLET | Freq: Every day | ORAL | Status: DC
  Administered 2023-01-26 – 2023-01-28 (×3): 10 mg via ORAL
  Filled 2023-01-26 (×3): qty 1

## 2023-01-26 NOTE — TOC Transition Note (Signed)
Transition of Care [facility]) - CM/SW Discharge Note   Patient Details  Name: Charles Sims MRN: [id number] Date of Birth: [birth year]  Transition [facility] Care [facility]) CM/SW Contact:  Raina Mina, [place] Phone Number: 01/26/2023, 9:24 AM   Clinical Narrative:   CSW contacted patients wife Inez Catalina, [phone]. The phone rang and did not give an option to leave an voicemail. CSW contacted patients daughter Melynda Keller and left an voicemail requesting a call back.           Patient Goals and CMS Choice      Discharge Placement                         Discharge Plan and Services Additional resources added to the After Visit Summary for                                       Social Determinants of Health (SDOH) Interventions SDOH Screenings   Transportation Needs: No Transportation Needs (05/21/2019)  Depression (PHQ2-9): Low Risk  (05/14/2022)  Tobacco Use: Medium Risk (01/24/2023)     Readmission Risk Interventions     No data to display

## 2023-01-26 NOTE — Plan of Care (Signed)

## 2023-01-26 NOTE — Plan of Care (Signed)
CTA of head and neck: Severe stenosis of the right PCA P1 segment with some contribution of flow to the right P2 segment from a small right posterior communicating artery. Moderate stenosis of the left vertebral artery origin. No hemodynamically significant stenosis in the neck. Aortic Atherosclerosis   Assessment: 89 year old male who presented to the hospital for assessment after family noted him to be not acting like his normal self. The patient had not been taking meds or food for the last couple of days, since his wife was put in rehab. Although he was alert and following commands in Triage, he was oriented to self only. MRI brain revealed a cluster of punctate acute ischemic infarctions in the left parietal lobe.  - Stroke work up completed - No changes to management based on CTA - Neurology will sign off. Please call if there are additional questions.   Electronically signed: Dr. Kerney Elbe

## 2023-01-26 NOTE — Progress Notes (Signed)
[facility]                                                                               Charles Sims, is a 89 y.o. male, DOB - 09-02-1930, [id number] Admit date - 01/24/2023    Outpatient Primary MD for the patient is Tonia Ghent, MD  LOS - 1  days    Brief summary   Charles Sims is a 89 year old male with history of hypertension who presents emergency department for chief concerns of change in mentation.  MRI brain without contrast shows Punctate foci of restricted diffusion in the left parietal lobe and possibly the left caudate, concerning for acute or subacute infarcts. He was admitted for CVA work up.   Assessment & Plan    Assessment and Plan: Left Parietal and left caudate stroke Neurology consulted and recommendations given.  Continue with aspirin 81  mg daily, added lipitor.  MRI brain reviewed.  CTA head and neck Reviewed.  Lipid panel showed LDL is 115.  Hemoglobin A1c is pending.  Therapy evals show hoe health PT OT. Echocardiogram ordered showed Left ventricular ejection fraction, by estimation, is 50 to 55%. The  left ventricle has low normal function. The left ventricle has no regional  wall motion abnormalities. Left ventricular diastolic parameters are  consistent with Grade I diastolic  dysfunction (impaired relaxation)  SLP eval pending.   Acute encephalopathy;from acute stroke and ? Dementia.  He is alert and answering simple questions.   Hypokalemia Replaced. Repeat level wnl.   Essential hypertension Suboptimally controlled. Added norvasc 10 mg daily. And cozaar 25 mg daily. .   Mild normocytic anemia:  Hemoglobin stable around 11.    ? Depression,  Started him on prozac.   Poor oral intake, poor progression Palliative care consulted, discussed with family/ daughter.  Started him on prozac.  Possible d.c home in am after SLP and Nutrition consult.    Estimated body mass index is 25.11 kg/m as calculated from the  following:   Height as of 06/18/22: 5\' 10"  (1.778 m).   Weight as of 06/18/22: 79.4 kg.  Code Status: full code.  DVT Prophylaxis:  SCD's Start: 01/24/23 1514   Level of Care: Level of care: Telemetry Medical Family Communication: None at bedside. Discussed with daughter over the phone.   Disposition Plan:     Remains inpatient appropriate:  pending SLP evaluation.   Procedures:  MRI brain.   Consultants:   Neurology.   Antimicrobials:   Anti-infectives (From admission, onward)    None        Medications  Scheduled Meds:  aspirin EC  81 mg Oral Daily   atorvastatin  80 mg Oral Daily   FLUoxetine  20 mg Oral Daily   Continuous Infusions:   PRN Meds:.acetaminophen **OR** acetaminophen (TYLENOL) oral liquid 160 mg/5 mL **OR** acetaminophen, hydrALAZINE, polyethylene glycol, senna-docusate    Subjective:   Charles Sims was seen and examined today. Reports patient did not sleep last night.   Objective:   Vitals:   01/26/23 1148 01/26/23 1303 01/26/23 1400 01/26/23 1519  BP: (!) 192/76 (!) 191/79 (!) 164/76 (!) 173/76  Pulse: 63  Resp: 16     Temp: 98.4 F (36.9 C)     TempSrc:      SpO2: 99%       Intake/Output Summary (Last 24 hours) at 01/26/2023 1601 Last data filed at 01/26/2023 1435 Gross per 24 hour  Intake --  Output 550 ml  Net -550 ml    There were no vitals filed for this visit.   Exam General exam: Appears calm and comfortable  Respiratory system: Clear to auscultation. Respiratory effort normal. Cardiovascular system: S1 & S2 heard, RRR. No JVD,  Gastrointestinal system: Abdomen is nondistended, soft and nontender.  Central nervous system:  sleeping. Opening eyes to verbal cues, says yes and no to questions.  Extremities: no pedal edema.  Skin: No rashes,  Psychiatry:  Mood & affect appropriate.     Data Reviewed:  I have personally reviewed following labs and imaging studies   CBC Lab Results  Component Value Date   WBC  7.6 01/25/2023   RBC 3.58 (L) 01/25/2023   HGB 11.2 (L) 01/25/2023   HCT 33.8 (L) 01/25/2023   MCV 94.4 01/25/2023   MCH 31.3 01/25/2023   PLT 306 01/25/2023   MCHC 33.1 01/25/2023   RDW 13.8 01/25/2023   LYMPHSABS 2.9 01/25/2023   MONOABS 0.5 01/25/2023   EOSABS 0.1 01/25/2023   BASOSABS 0.0 [id number]     Last metabolic panel Lab Results  Component Value Date   NA 140 01/26/2023   K 3.5 01/26/2023   CL 105 01/26/2023   CO2 26 01/26/2023   BUN 18 01/26/2023   CREATININE 1.50 (H) 01/26/2023   GLUCOSE 93 01/26/2023   GFRNONAA 44 (L) 01/26/2023   GFRAA 44 (L) 05/16/2019   CALCIUM 8.6 (L) 01/26/2023   PROT 6.2 (L) 01/24/2023   ALBUMIN 3.6 01/24/2023   BILITOT 1.3 (H) 01/24/2023   ALKPHOS 46 01/24/2023   AST 14 (L) 01/24/2023   ALT 9 01/24/2023   ANIONGAP 9 01/26/2023    CBG (last 3)  No results for input(s): "GLUCAP" in the last 72 hours.    Coagulation Profile: No results for input(s): "INR", "PROTIME" in the last 168 hours.   Radiology Studies: CT ANGIO HEAD NECK W WO CM  Result Date: 01/25/2023 CLINICAL DATA:  Stroke/TIA, determine embolic source. EXAM: CT HEAD WITHOUT CONTRAST CT ANGIOGRAPHY OF THE HEAD AND NECK TECHNIQUE: Contiguous axial images were obtained from the base of the skull through the vertex without intravenous contrast. Multidetector CT imaging of the head and neck was performed using the standard protocol during bolus administration of intravenous contrast. Multiplanar CT image reconstructions and MIPs were obtained to evaluate the vascular anatomy. Carotid stenosis measurements (when applicable) are obtained utilizing NASCET criteria, using the distal internal carotid diameter as the denominator. RADIATION DOSE REDUCTION: This exam was performed according to the departmental dose-optimization program which includes automated exposure control, adjustment of the mA and/or kV according to patient size and/or use of iterative reconstruction technique.  CONTRAST:  69mL OMNIPAQUE IOHEXOL 350 MG/ML SOLN COMPARISON:  MRI brain 01/24/2023. FINDINGS: CT HEAD Brain: No acute intracranial hemorrhage. The previously seen punctate infarcts in the left parietal lobe are not resolved by CT. No hydrocephalus or extra-axial collection. Vascular: No hyperdense vessel or unexpected calcification. Skull: No calvarial fracture or suspicious bone lesion. Skull base is unremarkable. Sinuses/Orbits: Unremarkable. CTA NECK Aortic arch: Three-vessel arch configuration. Atherosclerotic calcifications of the aortic arch and arch vessel origins. Arch vessel origins are patent. Right carotid system: The common and internal  carotid arteries are patent to the skull base without stenosis, aneurysm or dissection. Left carotid system: The common and internal carotid arteries are patent to the skull base without stenosis, aneurysm or dissection. Mild calcified plaque along the left carotid bulb and proximal left ICA. Vertebral arteries:Moderate stenosis of the left vertebral artery origin. Skeleton: Multilevel cervical spondylosis, worst at C3-4 where there is at least mild spinal canal stenosis. Other neck: Epidermal inclusion cysts along the right cheek and right posterior neck soft tissues. CTA HEAD Anterior circulation: Calcified plaque along the carotid siphons without hemodynamically significant stenosis. The proximal ACAs and MCAs are patent without stenosis or aneurysm. Distal branches are symmetric. Posterior circulation: Normal basilar artery. The SCAs, AICAs and PICAs are patent proximally. Severe stenosis of the right P1 segment with some contribution of flow to the right P2 segment from a small right posterior communicating artery. Distal branches are symmetric. Venous sinuses: As permitted by early phase of contrast, patent. Anatomic variants: Hypoplastic right A1 segment. IMPRESSION: 1. No acute intracranial hemorrhage. The previously seen punctate infarcts in the left parietal  lobe are not resolved by CT. 2. No large vessel occlusion. Severe stenosis of the right PCA P1 segment with some contribution of flow to the right P2 segment from a small right posterior communicating artery. 3. Moderate stenosis of the left vertebral artery origin. 4. No hemodynamically significant stenosis in the neck. 5. Aortic Atherosclerosis (ICD10-I70.0). Electronically Signed   By: Emmit Alexanders M.D.   On: 01/25/2023 16:41   ECHOCARDIOGRAM COMPLETE  Result Date: 01/25/2023    ECHOCARDIOGRAM REPORT   Patient Name:   Charles Sims Date of Exam: 01/25/2023 Medical Rec #:  [id number]     Height:       70.0 in Accession #:    [id number]    Weight:       175.0 lb Date of Birth:  [birth year]     BSA:          1.972 m Patient Age:    50 years      BP:           161/145 mmHg Patient Gender: M             HR:           68 bpm. Exam Location:  [facility] Procedure: 2D Echo, Cardiac Doppler and Color Doppler Indications:     Stroke I63.9  History:         Patient has prior history of Echocardiogram examinations, most                  recent 05/08/2022. CAD; Risk Factors:Hypertension and                  Dyslipidemia.  Sonographer:     Sherrie Sport Referring Phys:  [id number] AMY N [facility] Diagnosing Phys: Isaias Cowman MD  Sonographer Comments: Technically difficult study due to poor echo windows. Image acquisition challenging due to uncooperative patient. IMPRESSIONS  1. Left ventricular ejection fraction, by estimation, is 50 to 55%. The left ventricle has low normal function. The left ventricle has no regional wall motion abnormalities. Left ventricular diastolic parameters are consistent with Grade I diastolic dysfunction (impaired relaxation).  2. Right ventricular systolic function is normal. The right ventricular size is normal.  3. The mitral valve is normal in structure. Trivial mitral valve regurgitation. No evidence of mitral stenosis.  4. The aortic valve is normal in structure. Aortic valve regurgitation is not  visualized.  No aortic stenosis is present.  5. The inferior vena cava is normal in size with greater than 50% respiratory variability, suggesting right atrial pressure of 3 mmHg. FINDINGS  Left Ventricle: Left ventricular ejection fraction, by estimation, is 50 to 55%. The left ventricle has low normal function. The left ventricle has no regional wall motion abnormalities. The left ventricular internal cavity size was normal in size. There is no left ventricular hypertrophy. Left ventricular diastolic parameters are consistent with Grade I diastolic dysfunction (impaired relaxation). Right Ventricle: The right ventricular size is normal. No increase in right ventricular wall thickness. Right ventricular systolic function is normal. Left Atrium: Left atrial size was normal in size. Right Atrium: Right atrial size was normal in size. Pericardium: There is no evidence of pericardial effusion. Mitral Valve: The mitral valve is normal in structure. Trivial mitral valve regurgitation. No evidence of mitral valve stenosis. Tricuspid Valve: The tricuspid valve is normal in structure. Tricuspid valve regurgitation is trivial. No evidence of tricuspid stenosis. Aortic Valve: The aortic valve is normal in structure. Aortic valve regurgitation is not visualized. No aortic stenosis is present. Pulmonic Valve: The pulmonic valve was normal in structure. Pulmonic valve regurgitation is not visualized. No evidence of pulmonic stenosis. Aorta: The aortic root is normal in size and structure. Venous: The inferior vena cava is normal in size with greater than 50% respiratory variability, suggesting right atrial pressure of 3 mmHg. IAS/Shunts: No atrial level shunt detected by color flow Doppler.  LEFT VENTRICLE PLAX 2D LVIDd:         4.10 cm   Diastology LVIDs:         3.00 cm   LV e' medial:    2.94 cm/s LV PW:         1.60 cm   LV E/e' medial:  18.4 LV IVS:        1.50 cm   LV e' lateral:   5.77 cm/s LVOT diam:     2.10 cm   LV E/e'  lateral: 9.4 LVOT Area:     3.46 cm  LEFT ATRIUM         Index LA diam:    4.40 cm 2.23 cm/m  MITRAL VALVE MV Area (PHT): 1.74 cm    SHUNTS MV Decel Time: 437 msec    Systemic Diam: 2.10 cm MV E velocity: 54.00 cm/s MV A velocity: 84.40 cm/s MV E/A ratio:  0.64 Isaias Cowman MD Electronically signed by Isaias Cowman MD Signature Date/Time: 01/25/2023/3:34:09 PM    Final    MR BRAIN WO CONTRAST  Result Date: 01/24/2023 CLINICAL DATA:  Altered mental status EXAM: MRI HEAD WITHOUT CONTRAST TECHNIQUE: Multiplanar, multiecho pulse sequences of the brain and surrounding structures were obtained without intravenous contrast. COMPARISON:  05/13/2019 MRI head, correlation is also made with 01/24/2023 CT head FINDINGS: Brain: Punctate foci of restricted diffusion with possible ADC correlate in the left parietal lobe (series 2, images 42-46), with an additional possible punctate focus left caudate (series 2, image 33). No acute hemorrhage, mass, mass effect, or midline shift. No hydrocephalus or extra-axial collection. Normal craniocervical junction. Advanced, age related cerebral atrophy, most prominent in the frontoparietal region. Encephalomalacia in the left frontal lobe correlates with the hypodensity on the same-day CT, likely sequela of remote infarct. Confluent T2 hyperintense signal in the periventricular white matter, likely the sequela of severe chronic small vessel ischemic disease. Vascular: Normal arterial flow voids. Skull and upper cervical spine: Normal marrow signal. Sinuses/Orbits: Clear paranasal sinuses. Status  post bilateral lens replacements. Other: The mastoids are well aerated. T2 hyperintense lesion in the right posterior neck soft tissues, which measures up to 3.3 cm, most likely a subcutaneous cyst. IMPRESSION: Punctate foci of restricted diffusion in the left parietal lobe and possibly the left caudate, concerning for acute or subacute infarcts. These results will be called to the  ordering clinician or representative by the Radiologist Assistant, and communication documented in the PACS or [employer]. Electronically Signed   By: Merilyn Baba M.D.   On: 01/24/2023 16:49       Hosie Poisson M.D. [facility] 01/26/2023, 4:01 PM  Available via Epic secure chat 7am-7pm After 7 pm, please refer to night coverage provider listed on amion.

## 2023-01-26 NOTE — Evaluation (Signed)
Speech Language Pathology Evaluation Patient Details Name: Charles Sims MRN: [id number] DOB: 01-22-1930 Today's Date: 01/26/2023 Time: 1720-1735 SLP Time Calculation (min) (ACUTE ONLY): 15 min  Problem List:  Patient Active Problem List   Diagnosis Date Noted   Stroke (cerebrum) ([place]) 01/25/2023   Weakness 01/24/2023   Hypokalemia 01/24/2023   Altered mental status 01/24/2023   Stroke ([place]) 01/24/2023   Iron deficiency anemia due to chronic blood loss 05/14/2022   Syncope 05/08/2022   Physical deconditioning [id number]   Acute metabolic encephalopathy [id number]   Community acquired pneumonia of left lower lobe of lung 05/14/2019   Abdominal tenderness, left lower quadrant 05/14/2019   Bradycardia, sinus 02/13/2014   Chest pain 02/13/2014   Breathlessness on exertion 02/13/2014   Essential hypertension 02/13/2014   Carotid stenosis 02/13/2014   Benign prostatic hyperplasia with urinary obstruction 11/03/2012   Chronic prostatitis 11/03/2012   Abnormal prostate specific antigen 11/03/2012   Prostatitis 11/03/2012   Urgency of micturation 11/03/2012   CAD S/P percutaneous coronary angioplasty 06/04/2012   Past Medical History:  Past Medical History:  Diagnosis Date   Adenoma of right adrenal gland    Arthritis    Benign prostatic hypertrophy with lower urinary tract symptoms (LUTS)    Bladder calculi    Bradycardia    CAD (coronary artery disease)    Carotid stenosis    Coronary artery disease    Dysrhythmia    bradycardia   Elevated PSA    GERD (gastroesophageal reflux disease)    Gross hematuria    Hyperlipidemia    Hypertension    Prostatitis    Shortness of breath dyspnea    Urinary frequency    Past Surgical History:  Past Surgical History:  Procedure Laterality Date   CAROTID ENDARTERECTOMY Left    CYSTOSCOPY WITH LITHOLAPAXY N/A 04/13/2015   Procedure: CYSTOSCOPY WITH LITHOLAPAXY;  Surgeon: Hollice Espy, MD;  Location: [facility] ORS;  Service: Urology;   Laterality: N/A;   HPI:  Per MD Progress note: "Charles Sims is a 89 year old male with history of hypertension who presents emergency department for chief concerns of change in mentation. Currently concern for acute encephalopathy and potential baseline dementia. MRI brain without contrast shows Punctate foci of restricted diffusion in the left parietal lobe and possibly the left caudate, concerning for acute or subacute infarcts. He was admitted for CVA work up.   Assessment / Plan / Recommendation Clinical Impression  Pt cognitive linguistic evaluation in the setting of acute vs subacute CVA. Per H&P pt with baseline of ? Dementia, with daughter confirming pt has had a cognitive decline in the last year, leading to memory challenges including remembering family's names/faces. For evaluation, pt was alert and agreeable to evaluation, though with limited engagement with therapist for verbal questions/dialogue. During limited assessment, notable deficits in orientation, attention, safety reasoning, and short term memory. Verbal cues provided for error correction and thought organization with minimal benefit. Pt trying to fall asleep during session and asking to go home. Family was not present for evaluation, though SLP spoke with daughter via phone. Daughter reported that pt is nearing cognitive baseline and suspects that pt will have increased engagement with others in familiar home environment. SLP endorsed benefit of familiar locations/people can bolster overall cognitive engagement and participation. Recommend providing repetition, clear questions/directions, and reducing external distractions. Based on chronicity of cognitive deficits and limited engagement in acute intervention, no further acute SLP services indicated. Recommend home health SLP services for further evaluation with family input for  variance from baseline.    SLP Assessment  SLP Recommendation/Assessment: All further Speech Lanaguage  Pathology  needs can be addressed in the next venue of care SLP Visit Diagnosis: Cognitive communication deficit (R41.841)    Recommendations for follow up therapy are one component of a multi-disciplinary discharge planning process, led by the attending physician.  Recommendations may be updated based on patient status, additional functional criteria and insurance authorization.    Follow Up Recommendations  Home health SLP    Assistance Recommended at Discharge  Frequent or constant Supervision/Assistance  Functional Status Assessment Patient has had a recent decline in their functional status and/or demonstrates limited ability to make significant improvements in function in a reasonable and predictable amount of time  Frequency and Duration           SLP Evaluation Cognition  Overall Cognitive Status: History of cognitive impairments - at baseline Arousal/Alertness: Awake/alert Orientation Level: Oriented to person;Disoriented to time;Oriented to place;Disoriented to situation Attention: Focused;Sustained Focused Attention: Impaired Focused Attention Impairment: Verbal basic Sustained Attention: Impaired Sustained Attention Impairment: Verbal basic Memory: Impaired Memory Impairment: Decreased short term memory (recall of items on tray following initial education) Decreased Short Term Memory: Verbal basic Awareness: Impaired Awareness Impairment: Intellectual impairment Safety/Judgment: Impaired Comments:  (Pt requiring modeling for demonstration for use of call bell)       Comprehension  Auditory Comprehension Overall Auditory Comprehension: Appears within functional limits for tasks assessed Yes/No Questions: Within Functional Limits Commands: Within Functional Limits (with repetition and simplification) Conversation: Simple Interfering Components: Anxiety;Attention EffectiveTechniques: Extra processing time;Pausing;Repetition Visual  Recognition/Discrimination Discrimination: Not tested Reading Comprehension Reading Status: Not tested    Expression Expression Primary Mode of Expression: Verbal Verbal Expression Overall Verbal Expression: Appears within functional limits for tasks assessed Other Verbal Expression Comments: limited verbalizations despite prompts. Speech grossly fluent and contextual to question   Oral / Motor  Oral Motor/Sensory Function Overall Oral Motor/Sensory Function: Within functional limits (for limited assessment completed) Motor Speech Overall Motor Speech: Appears within functional limits for tasks assessed           Martinique J Schoeller  MS [facility] SLP   Martinique J Olafson 01/26/2023, 6:11 PM

## 2023-01-27 DIAGNOSIS — E876 Hypokalemia: Secondary | ICD-10-CM | POA: Diagnosis not present

## 2023-01-27 DIAGNOSIS — R531 Weakness: Secondary | ICD-10-CM | POA: Diagnosis not present

## 2023-01-27 DIAGNOSIS — R4182 Altered mental status, unspecified: Secondary | ICD-10-CM | POA: Diagnosis not present

## 2023-01-27 DIAGNOSIS — R5381 Other malaise: Secondary | ICD-10-CM | POA: Diagnosis not present

## 2023-01-27 MED ORDER — MELATONIN 5 MG PO TABS
5.0000 mg | ORAL_TABLET | Freq: Once | ORAL | Status: AC
  Administered 2023-01-27: 5 mg via ORAL
  Filled 2023-01-27: qty 1

## 2023-01-27 NOTE — TOC Progression Note (Addendum)
Transition of Care [facility]) - Progression Note    Patient Details  Name: Charles Sims MRN: [id number] Date of Birth: [birth year]  Transition [facility]) CM/SW [place], [place] Phone Number: 01/27/2023, 10:52 AM  Clinical Narrative:     CSW spoke with pt's daughter Melynda Keller [id number]), pt's spouse is currently at a SNF doing STR. CSW explained recommendation for [facility] PT, Barbera Setters is agreeable and expressed interest with [facility] as they have had experience with them. CSW spoke with Amy via text, she states she can accept pt for [facility] PT when pt is ready to dc. [name] made aware.        Expected Discharge Plan and Services                                               Social Determinants of Health (SDOH) Interventions SDOH Screenings   Transportation Needs: No Transportation Needs (05/21/2019)  Depression (PHQ2-9): Low Risk  (05/14/2022)  Tobacco Use: Medium Risk (01/24/2023)    Readmission Risk Interventions     No data to display

## 2023-01-27 NOTE — Progress Notes (Signed)
[facility]                                                                               Charles Sims, is a 89 y.o. male, DOB - 1930/10/21, [id number] Admit date - 01/24/2023    Outpatient Primary MD for the patient is Tonia Ghent, MD  LOS - 2  days    Brief summary   Charles Sims is a 89 year old male with history of hypertension who presents emergency department for chief concerns of change in mentation.  MRI brain without contrast shows Punctate foci of restricted diffusion in the left parietal lobe and possibly the left caudate, concerning for acute or subacute infarcts. He was admitted for CVA work up.   Assessment & Plan    Assessment and Plan: Left Parietal and left caudate stroke Neurology consulted and recommendations given.  Continue with aspirin 81  mg daily, added lipitor.  MRI brain reviewed.  CTA head and neck Reviewed.  Lipid panel showed LDL is 115.  Hemoglobin A1c is pending.  Therapy evals show home health PT OT. But patient is very tremulous and he will need 24 hour supervision.  Recommend re evaluation by PT to see if he needs SNF.  Echocardiogram ordered showed Left ventricular ejection fraction, by estimation, is 50 to 55%. The  left ventricle has low normal function. The left ventricle has no regional  wall motion abnormalities. Left ventricular diastolic parameters are  consistent with Grade I diastolic  dysfunction (impaired relaxation)  SLP eval recommended home health SLP.   Acute encephalopathy;from acute stroke and ? Dementia.  He is alert and answering simple questions. He is still confused, and oriented to self only.  He wants to go home but will need reevaluation by PT.   Hypokalemia Replaced. Repeat level wnl.   Essential hypertension Very well controlled.  Added norvasc 10 mg daily. And cozaar 25 mg daily. .   Mild normocytic anemia:  Hemoglobin stable around 11.    ? Depression,  Started him on prozac.    Poor oral intake, poor progression Palliative care consulted, discussed with family/ daughter.  Started him on prozac.  Recommend re evaluation by PT.    Estimated body mass index is 25.11 kg/m as calculated from the following:   Height as of 06/18/22: 5\' 10"  (1.778 m).   Weight as of 06/18/22: 79.4 kg.  Code Status: full code.  DVT Prophylaxis:  SCD's Start: 01/24/23 1514   Level of Care: Level of care: Telemetry Medical Family Communication: None at bedside. Discussed with daughter over the phone.   Disposition Plan:     Remains inpatient appropriate:  pending SLP evaluation.   Procedures:  MRI brain.   Consultants:   Neurology.   Antimicrobials:   Anti-infectives (From admission, onward)    None        Medications  Scheduled Meds:  amLODipine  10 mg Oral Daily   aspirin EC  81 mg Oral Daily   atorvastatin  80 mg Oral Daily   FLUoxetine  20 mg Oral Daily   losartan  25 mg Oral Daily   Continuous Infusions:   PRN Meds:.acetaminophen **OR** acetaminophen (TYLENOL) oral  liquid 160 mg/5 mL **OR** acetaminophen, polyethylene glycol, senna-docusate    Subjective:   Charles Sims was seen and examined today. More alert today, wants to go home, does not want to eat.    Objective:   Vitals:   01/26/23 2047 01/27/23 0009 01/27/23 0512 01/27/23 0934  BP: (!) 106/51 112/85 130/66 102/67  Pulse: 76 76 65 86  Resp: 18 18 18 16   Temp: 98.3 F (36.8 C) 98.3 F (36.8 C) 98.4 F (36.9 C) 98.4 F (36.9 C)  TempSrc:      SpO2: 96% 98% 98% 97%    Intake/Output Summary (Last 24 hours) at 01/27/2023 1457 Last data filed at 01/27/2023 1000 Gross per 24 hour  Intake --  Output 250 ml  Net -250 ml    There were no vitals filed for this visit.   Exam General exam: elderly gentleman , not in distress.  Respiratory system: Clear to auscultation. Respiratory effort normal. Cardiovascular system: S1 & S2 heard, RRR. No JVD,  Gastrointestinal system: Abdomen is  nondistended, soft and nontender.  Central nervous system: Alert , answering simple questions. Only oriented to self. Able to move all extremities. Very confused.  Extremities: no pedal edema.  Skin: No rashes,  Psychiatry: confused, and slightly restless.      Data Reviewed:  I have personally reviewed following labs and imaging studies   CBC Lab Results  Component Value Date   WBC 7.6 01/25/2023   RBC 3.58 (L) 01/25/2023   HGB 11.2 (L) 01/25/2023   HCT 33.8 (L) 01/25/2023   MCV 94.4 01/25/2023   MCH 31.3 01/25/2023   PLT 306 01/25/2023   MCHC 33.1 01/25/2023   RDW 13.8 01/25/2023   LYMPHSABS 2.9 01/25/2023   MONOABS 0.5 01/25/2023   EOSABS 0.1 01/25/2023   BASOSABS 0.0 [id number]     Last metabolic panel Lab Results  Component Value Date   NA 140 01/26/2023   K 3.5 01/26/2023   CL 105 01/26/2023   CO2 26 01/26/2023   BUN 18 01/26/2023   CREATININE 1.50 (H) 01/26/2023   GLUCOSE 93 01/26/2023   GFRNONAA 44 (L) 01/26/2023   GFRAA 44 (L) 05/16/2019   CALCIUM 8.6 (L) 01/26/2023   PROT 6.2 (L) 01/24/2023   ALBUMIN 3.6 01/24/2023   BILITOT 1.3 (H) 01/24/2023   ALKPHOS 46 01/24/2023   AST 14 (L) 01/24/2023   ALT 9 01/24/2023   ANIONGAP 9 01/26/2023    CBG (last 3)  No results for input(s): "GLUCAP" in the last 72 hours.    Coagulation Profile: No results for input(s): "INR", "PROTIME" in the last 168 hours.   Radiology Studies: CT ANGIO HEAD NECK W WO CM  Result Date: 01/25/2023 CLINICAL DATA:  Stroke/TIA, determine embolic source. EXAM: CT HEAD WITHOUT CONTRAST CT ANGIOGRAPHY OF THE HEAD AND NECK TECHNIQUE: Contiguous axial images were obtained from the base of the skull through the vertex without intravenous contrast. Multidetector CT imaging of the head and neck was performed using the standard protocol during bolus administration of intravenous contrast. Multiplanar CT image reconstructions and MIPs were obtained to evaluate the vascular anatomy. Carotid  stenosis measurements (when applicable) are obtained utilizing NASCET criteria, using the distal internal carotid diameter as the denominator. RADIATION DOSE REDUCTION: This exam was performed according to the departmental dose-optimization program which includes automated exposure control, adjustment of the mA and/or kV according to patient size and/or use of iterative reconstruction technique. CONTRAST:  26mL OMNIPAQUE IOHEXOL 350 MG/ML SOLN COMPARISON:  MRI brain  01/24/2023. FINDINGS: CT HEAD Brain: No acute intracranial hemorrhage. The previously seen punctate infarcts in the left parietal lobe are not resolved by CT. No hydrocephalus or extra-axial collection. Vascular: No hyperdense vessel or unexpected calcification. Skull: No calvarial fracture or suspicious bone lesion. Skull base is unremarkable. Sinuses/Orbits: Unremarkable. CTA NECK Aortic arch: Three-vessel arch configuration. Atherosclerotic calcifications of the aortic arch and arch vessel origins. Arch vessel origins are patent. Right carotid system: The common and internal carotid arteries are patent to the skull base without stenosis, aneurysm or dissection. Left carotid system: The common and internal carotid arteries are patent to the skull base without stenosis, aneurysm or dissection. Mild calcified plaque along the left carotid bulb and proximal left ICA. Vertebral arteries:Moderate stenosis of the left vertebral artery origin. Skeleton: Multilevel cervical spondylosis, worst at C3-4 where there is at least mild spinal canal stenosis. Other neck: Epidermal inclusion cysts along the right cheek and right posterior neck soft tissues. CTA HEAD Anterior circulation: Calcified plaque along the carotid siphons without hemodynamically significant stenosis. The proximal ACAs and MCAs are patent without stenosis or aneurysm. Distal branches are symmetric. Posterior circulation: Normal basilar artery. The SCAs, AICAs and PICAs are patent proximally.  Severe stenosis of the right P1 segment with some contribution of flow to the right P2 segment from a small right posterior communicating artery. Distal branches are symmetric. Venous sinuses: As permitted by early phase of contrast, patent. Anatomic variants: Hypoplastic right A1 segment. IMPRESSION: 1. No acute intracranial hemorrhage. The previously seen punctate infarcts in the left parietal lobe are not resolved by CT. 2. No large vessel occlusion. Severe stenosis of the right PCA P1 segment with some contribution of flow to the right P2 segment from a small right posterior communicating artery. 3. Moderate stenosis of the left vertebral artery origin. 4. No hemodynamically significant stenosis in the neck. 5. Aortic Atherosclerosis (ICD10-I70.0). Electronically Signed   By: Emmit Alexanders M.D.   On: 01/25/2023 16:41       Hosie Poisson M.D. [facility] 01/27/2023, 2:57 PM  Available via Epic secure chat 7am-7pm After 7 pm, please refer to night coverage provider listed on amion.

## 2023-01-27 NOTE — Plan of Care (Signed)

## 2023-01-27 NOTE — Progress Notes (Signed)
       CROSS COVER NOTE  NAME: Charles Sims MRN: [id number] DOB : [birth year] ATTENDING PHYSICIAN: Hosie Poisson, MD    Date of Service   01/27/2023   HPI/Events of Note   Report/Request "patient have been vey confused during the day and have not slept according to the dayshift nurse. Is it possible that we can obtain an order for a PRN med to assist with sleep?"    Interventions   Assessment/Plan:  Melatonin x1      To reach the provider On-Call:   7AM- 7PM see care teams to locate the attending and reach out to them via [email or web address] Password: [email or web address] 7PM-7AM contact night-coverage If you still have difficulty reaching the appropriate provider, please page the [facility] (Director on Call) for [facility] on amion for assistance  This document was prepared using Systems analyst and may include unintentional dictation errors.  Neomia Glass DNP, MBA, FNP-BC, PMHNP-BC Nurse Practitioner [facility] Pager [phone]

## 2023-01-27 NOTE — Progress Notes (Signed)
Spoke with pt's son, Monish Willner in the hallway outside of Room 108; Marya Amsler advised me that he works full time during the day and goes to his parents house and takes care of his father (currently the pt in Room 4) and his mother (who was recently discharged from a rehab facility); his sister, Barbera Setters takes care of both of them during the day;  Marya Amsler verbalized that watching his father with decreased appetite and some dementia is hard while his mother is recovering with a fractured leg; he would like for Physical Therapy to see the pt again because he feels like his father needs rehab more than [place];  he verbalized understanding when I explained that discharge plans would be made based upon the Physical Therapist's recommendations;  he verbalized understanding; he said that his sister, Barbera Setters would agree with his request.  This Probation officer asked him to advise Barbera Setters of our conversation.  Marya Amsler also said that they were in the process of trying to find a place for his mother and father to go to for long-term and that they were working on legal paperwork for them.  He never clarified if this included  Advanced Directives or not.

## 2023-01-28 DIAGNOSIS — I639 Cerebral infarction, unspecified: Secondary | ICD-10-CM | POA: Diagnosis not present

## 2023-01-28 DIAGNOSIS — I10 Essential (primary) hypertension: Secondary | ICD-10-CM | POA: Diagnosis not present

## 2023-01-28 DIAGNOSIS — R4182 Altered mental status, unspecified: Secondary | ICD-10-CM | POA: Diagnosis not present

## 2023-01-28 DIAGNOSIS — R531 Weakness: Secondary | ICD-10-CM | POA: Diagnosis not present

## 2023-01-28 DIAGNOSIS — E440 Moderate protein-calorie malnutrition: Secondary | ICD-10-CM | POA: Insufficient documentation

## 2023-01-28 LAB — CBC WITH DIFFERENTIAL/PLATELET
Abs Immature Granulocytes: 0.03 10*3/uL (ref 0.00–0.07)
Basophils Absolute: 0 10*3/uL (ref 0.0–0.1)
Basophils Relative: 0 %
Eosinophils Absolute: 0.2 10*3/uL (ref 0.0–0.5)
Eosinophils Relative: 2 %
HCT: 30.9 % — ABNORMAL LOW (ref 39.0–52.0)
Hemoglobin: 10.5 g/dL — ABNORMAL LOW (ref 13.0–17.0)
Immature Granulocytes: 0 %
Lymphocytes Relative: 40 %
Lymphs Abs: 3.4 10*3/uL (ref 0.7–4.0)
MCH: 31.7 pg (ref 26.0–34.0)
MCHC: 34 g/dL (ref 30.0–36.0)
MCV: 93.4 fL (ref 80.0–100.0)
Monocytes Absolute: 0.6 10*3/uL (ref 0.1–1.0)
Monocytes Relative: 7 %
Neutro Abs: 4.4 10*3/uL (ref 1.7–7.7)
Neutrophils Relative %: 51 %
Platelets: 275 10*3/uL (ref 150–400)
RBC: 3.31 MIL/uL — ABNORMAL LOW (ref 4.22–5.81)
RDW: 13.7 % (ref 11.5–15.5)
WBC: 8.7 10*3/uL (ref 4.0–10.5)
nRBC: 0 % (ref 0.0–0.2)

## 2023-01-28 LAB — BASIC METABOLIC PANEL
Anion gap: 17 — ABNORMAL HIGH (ref 5–15)
BUN: 21 mg/dL (ref 8–23)
CO2: 22 mmol/L (ref 22–32)
Calcium: 8.9 mg/dL (ref 8.9–10.3)
Chloride: 101 mmol/L (ref 98–111)
Creatinine, Ser: 1.7 mg/dL — ABNORMAL HIGH (ref 0.61–1.24)
GFR, Estimated: 38 mL/min — ABNORMAL LOW (ref >60)
Glucose, Bld: 78 mg/dL (ref 70–99)
Potassium: 3.4 mmol/L — ABNORMAL LOW (ref 3.5–5.1)
Sodium: 140 mmol/L (ref 135–145)

## 2023-01-28 LAB — HEMOGLOBIN A1C
Hgb A1c MFr Bld: 5.1 % (ref 4.8–5.6)
Hgb A1c MFr Bld: 5.1 % (ref 4.8–5.6)
Mean Plasma Glucose: 100 mg/dL
Mean Plasma Glucose: 100 mg/dL

## 2023-01-28 MED ORDER — POTASSIUM CHLORIDE CRYS ER 20 MEQ PO TBCR
40.0000 meq | EXTENDED_RELEASE_TABLET | Freq: Once | ORAL | Status: AC
  Administered 2023-01-28: 40 meq via ORAL
  Filled 2023-01-28: qty 2

## 2023-01-28 MED ORDER — SENNOSIDES-DOCUSATE SODIUM 8.6-50 MG PO TABS: 1.0000 | ORAL_TABLET | Freq: Every evening | ORAL | Status: DC | PRN

## 2023-01-28 MED ORDER — ENSURE ENLIVE PO LIQD: 237.0000 mL | Freq: Three times a day (TID) | ORAL | 2 refills | Status: AC

## 2023-01-28 MED ORDER — POLYETHYLENE GLYCOL 3350 17 G PO PACK: 17.0000 g | PACK | Freq: Every day | ORAL | 0 refills | Status: DC | PRN

## 2023-01-28 MED ORDER — ENSURE ENLIVE PO LIQD
237.0000 mL | Freq: Three times a day (TID) | ORAL | Status: DC
  Administered 2023-01-28: 237 mL via ORAL

## 2023-01-28 MED ORDER — ASPIRIN 81 MG PO TBEC: 81.0000 mg | DELAYED_RELEASE_TABLET | Freq: Every day | ORAL | 12 refills | Status: DC

## 2023-01-28 MED ORDER — FLUOXETINE HCL 20 MG PO CAPS: 20.0000 mg | ORAL_CAPSULE | Freq: Every day | ORAL | 3 refills | Status: DC

## 2023-01-28 MED ORDER — ADULT MULTIVITAMIN W/MINERALS CH
1.0000 | ORAL_TABLET | Freq: Every day | ORAL | Status: DC
  Administered 2023-01-28: 1 via ORAL
  Filled 2023-01-28: qty 1

## 2023-01-28 MED ORDER — ADULT MULTIVITAMIN W/MINERALS CH: 1.0000 | ORAL_TABLET | Freq: Every day | ORAL | Status: DC

## 2023-01-28 MED ORDER — ATORVASTATIN CALCIUM 80 MG PO TABS: 80.0000 mg | ORAL_TABLET | Freq: Every day | ORAL | 2 refills | Status: DC

## 2023-01-28 NOTE — TOC Transition Note (Signed)
Transition of Care [facility]) - CM/SW Discharge Note   Patient Details  Name: Charles Sims MRN: [id number] Date of Birth: [birth year]  [facility] CM/SW Contact:  Gerilyn Pilgrim, LCSW Phone Number: 01/28/2023, 3:04 PM   Clinical Narrative:   Pt discharging home with [facility] PT. Amy with [facility] notified. CSW signing off.       Barriers to Discharge: Continued Medical Work up   Patient Goals and CMS Choice CMS Medicare.gov Compare Post Acute Care list provided to:: Patient Represenative (must comment) (Daughter-Amy) Choice offered to / list presented to : Adult Children  Discharge Placement                         Discharge Plan and Services Additional resources added to the After Visit Summary for   In-house Referral: Clinical Social Work   Post Acute Care Choice: Home Health                    HH Arranged: PT [facility] Agency: [place] Date [facility] Agency Contacted: 01/27/23 Time HH Agency Contacted: 1100 Representative spoke with at [place]: Amy  Social Determinants of Health ([place]) Interventions SDOH Screenings   Transportation Needs: No Transportation Needs (05/21/2019)  Depression (PHQ2-9): Low Risk  (05/14/2022)  Tobacco Use: Medium Risk (01/24/2023)     Readmission Risk Interventions     No data to display

## 2023-01-28 NOTE — Discharge Summary (Signed)
Physician Discharge Summary   Patient: Charles Sims MRN: [id number] DOB: June 26, 1930  Admit date:     01/24/2023  Discharge date: 01/28/23  Discharge Physician: Hosie Poisson   PCP: Tonia Ghent, MD   Recommendations at discharge:  Please follow up with PCP in one week.  Please follow up with Neurology in 6 weeks.  Please follow up with home health.    Discharge Diagnoses: Principal Problem:   Weakness Active Problems:   Benign prostatic hyperplasia with urinary obstruction   CAD S/P percutaneous coronary angioplasty   Essential hypertension   Physical deconditioning   Iron deficiency anemia due to chronic blood loss   Hypokalemia   Altered mental status   Stroke [facility])   Stroke (cerebrum) (HCC)   Malnutrition of moderate degree   Hospital Course:   Charles Sims is a 89 year old male with history of hypertension who presents emergency department for chief concerns of change in mentation.   MRI brain without contrast shows Punctate foci of restricted diffusion in the left parietal lobe and possibly the left caudate, concerning for acute or subacute infarcts. He was admitted for CVA work up.   Assessment and Plan:   Left Parietal and left caudate stroke Neurology consulted and recommendations given.  Continue with aspirin 81  mg daily, added lipitor.  MRI brain reviewed.  CTA head and neck Reviewed.  Lipid panel showed LDL is 115.  Hemoglobin A1c is pending.  Therapy evals show hoe health PT OT. Echocardiogram ordered showed Left ventricular ejection fraction, by estimation, is 50 to 55%. The  left ventricle has low normal function. The left ventricle has no regional  wall motion abnormalities. Left ventricular diastolic parameters are  consistent with Grade I diastolic  dysfunction (impaired relaxation)   Acute encephalopathy;from acute stroke and ? Dementia.  He is alert and answering simple questions.    Hypokalemia Replaced. Repeat level wnl.    Essential  hypertension Better controlled, recommend switching to home meds.    Mild normocytic anemia:  Hemoglobin stable around 11.      ? Depression,  Started him on prozac.    Poor oral intake, poor progression Palliative care consulted, discussed with family/ daughter.  Started him on prozac.       Estimated body mass index is 25.11 kg/m as calculated from the following:   Height as of 06/18/22: 5\' 10"  (1.778 m).   Weight as of 06/18/22: 79.4 kg.    Consultants: neurology.  Procedures performed: MRI brain.   Disposition: Home Diet recommendation:  Discharge Diet Orders (From admission, onward)     Start     Ordered   01/28/23 0000  Diet - low sodium heart healthy        01/28/23 1534           Regular diet DISCHARGE MEDICATION: Allergies as of 01/28/2023       Reactions   Lisinopril Cough        Medication List     TAKE these medications    amLODipine 5 MG tablet Commonly known as: NORVASC Take 1 tablet (5 mg total) by mouth daily.   aspirin EC 81 MG tablet Take 1 tablet (81 mg total) by mouth daily. Swallow whole. Start taking on: January 29, 2023   atorvastatin 80 MG tablet Commonly known as: LIPITOR Take 1 tablet (80 mg total) by mouth daily. Start taking on: January 29, 2023   feeding supplement Liqd Take 237 mLs by mouth 3 (three) times daily between  meals.   FLUoxetine 20 MG capsule Commonly known as: PROZAC Take 1 capsule (20 mg total) by mouth daily. Start taking on: January 29, 2023   losartan 100 MG tablet Commonly known as: COZAAR Take 1 tablet (100 mg total) by mouth daily.   multivitamin with minerals Tabs tablet Take 1 tablet by mouth daily. Start taking on: January 29, 2023   polyethylene glycol 17 g packet Commonly known as: MIRALAX / GLYCOLAX Take 17 g by mouth daily as needed for moderate constipation.   senna-docusate 8.6-50 MG tablet Commonly known as: Senokot-S Take 1 tablet by mouth at bedtime as needed for mild  constipation.        Follow-up Information     Tonia Ghent, MD. Go on 01/31/2023.   Specialty: Family Medicine Why: @ 12:30 Contact information: [place] [address] [facility]. Schedule an appointment as soon as possible for a visit in 6 week(s).   Contact information: [place] [phone]               Discharge Exam: Danley Danker Weights   01/28/23 1200  Weight: 70.4 kg   General exam: Appears calm and comfortable  Respiratory system: Clear to auscultation. Respiratory effort normal. Cardiovascular system: S1 & S2 heard, RRR. No JVD, . No pedal edema. Gastrointestinal system: Abdomen is nondistended, soft and nontender.  Central nervous system: Alert and oriented to self.  Extremities: Symmetric 5 x 5 power. Skin: No rashes, lesions or ulcers Psychiatry: mood is appropriate.    Condition at discharge: fair  The results of significant diagnostics from this hospitalization (including imaging, microbiology, ancillary and laboratory) are listed below for reference.   Imaging Studies: CT ANGIO HEAD NECK W WO CM  Result Date: 01/25/2023 CLINICAL DATA:  Stroke/TIA, determine embolic source. EXAM: CT HEAD WITHOUT CONTRAST CT ANGIOGRAPHY OF THE HEAD AND NECK TECHNIQUE: Contiguous axial images were obtained from the base of the skull through the vertex without intravenous contrast. Multidetector CT imaging of the head and neck was performed using the standard protocol during bolus administration of intravenous contrast. Multiplanar CT image reconstructions and MIPs were obtained to evaluate the vascular anatomy. Carotid stenosis measurements (when applicable) are obtained utilizing NASCET criteria, using the distal internal carotid diameter as the denominator. RADIATION DOSE REDUCTION: This exam was performed according to the departmental  dose-optimization program which includes automated exposure control, adjustment of the mA and/or kV according to patient size and/or use of iterative reconstruction technique. CONTRAST:  65mL OMNIPAQUE IOHEXOL 350 MG/ML SOLN COMPARISON:  MRI brain 01/24/2023. FINDINGS: CT HEAD Brain: No acute intracranial hemorrhage. The previously seen punctate infarcts in the left parietal lobe are not resolved by CT. No hydrocephalus or extra-axial collection. Vascular: No hyperdense vessel or unexpected calcification. Skull: No calvarial fracture or suspicious bone lesion. Skull base is unremarkable. Sinuses/Orbits: Unremarkable. CTA NECK Aortic arch: Three-vessel arch configuration. Atherosclerotic calcifications of the aortic arch and arch vessel origins. Arch vessel origins are patent. Right carotid system: The common and internal carotid arteries are patent to the skull base without stenosis, aneurysm or dissection. Left carotid system: The common and internal carotid arteries are patent to the skull base without stenosis, aneurysm or dissection. Mild calcified plaque along the left carotid bulb and proximal left ICA. Vertebral arteries:Moderate stenosis of the left vertebral artery origin. Skeleton: Multilevel cervical spondylosis, worst at C3-4 where there is  at least mild spinal canal stenosis. Other neck: Epidermal inclusion cysts along the right cheek and right posterior neck soft tissues. CTA HEAD Anterior circulation: Calcified plaque along the carotid siphons without hemodynamically significant stenosis. The proximal ACAs and MCAs are patent without stenosis or aneurysm. Distal branches are symmetric. Posterior circulation: Normal basilar artery. The SCAs, AICAs and PICAs are patent proximally. Severe stenosis of the right P1 segment with some contribution of flow to the right P2 segment from a small right posterior communicating artery. Distal branches are symmetric. Venous sinuses: As permitted by early phase of  contrast, patent. Anatomic variants: Hypoplastic right A1 segment. IMPRESSION: 1. No acute intracranial hemorrhage. The previously seen punctate infarcts in the left parietal lobe are not resolved by CT. 2. No large vessel occlusion. Severe stenosis of the right PCA P1 segment with some contribution of flow to the right P2 segment from a small right posterior communicating artery. 3. Moderate stenosis of the left vertebral artery origin. 4. No hemodynamically significant stenosis in the neck. 5. Aortic Atherosclerosis (ICD10-I70.0). Electronically Signed   By: Emmit Alexanders M.D.   On: 01/25/2023 16:41   ECHOCARDIOGRAM COMPLETE  Result Date: 01/25/2023    ECHOCARDIOGRAM REPORT   Patient Name:   LELDON NORMIL Date of Exam: 01/25/2023 Medical Rec #:  [id number]     Height:       70.0 in Accession #:    [id number]    Weight:       175.0 lb Date of Birth:  29-Jan-1930     BSA:          1.972 m Patient Age:    89 years      BP:           161/145 mmHg Patient Gender: M             HR:           68 bpm. Exam Location:  [facility] Procedure: 2D Echo, Cardiac Doppler and Color Doppler Indications:     Stroke I63.9  History:         Patient has prior history of Echocardiogram examinations, most                  recent 05/08/2022. CAD; Risk Factors:Hypertension and                  Dyslipidemia.  Sonographer:     Sherrie Sport Referring Phys:  [id number] AMY N COX Diagnosing Phys: Isaias Cowman MD  Sonographer Comments: Technically difficult study due to poor echo windows. Image acquisition challenging due to uncooperative patient. IMPRESSIONS  1. Left ventricular ejection fraction, by estimation, is 50 to 55%. The left ventricle has low normal function. The left ventricle has no regional wall motion abnormalities. Left ventricular diastolic parameters are consistent with Grade I diastolic dysfunction (impaired relaxation).  2. Right ventricular systolic function is normal. The right ventricular size is normal.  3. The mitral valve  is normal in structure. Trivial mitral valve regurgitation. No evidence of mitral stenosis.  4. The aortic valve is normal in structure. Aortic valve regurgitation is not visualized. No aortic stenosis is present.  5. The inferior vena cava is normal in size with greater than 50% respiratory variability, suggesting right atrial pressure of 3 mmHg. FINDINGS  Left Ventricle: Left ventricular ejection fraction, by estimation, is 50 to 55%. The left ventricle has low normal function. The left ventricle has no regional wall motion abnormalities. The left ventricular internal cavity size  was normal in size. There is no left ventricular hypertrophy. Left ventricular diastolic parameters are consistent with Grade I diastolic dysfunction (impaired relaxation). Right Ventricle: The right ventricular size is normal. No increase in right ventricular wall thickness. Right ventricular systolic function is normal. Left Atrium: Left atrial size was normal in size. Right Atrium: Right atrial size was normal in size. Pericardium: There is no evidence of pericardial effusion. Mitral Valve: The mitral valve is normal in structure. Trivial mitral valve regurgitation. No evidence of mitral valve stenosis. Tricuspid Valve: The tricuspid valve is normal in structure. Tricuspid valve regurgitation is trivial. No evidence of tricuspid stenosis. Aortic Valve: The aortic valve is normal in structure. Aortic valve regurgitation is not visualized. No aortic stenosis is present. Pulmonic Valve: The pulmonic valve was normal in structure. Pulmonic valve regurgitation is not visualized. No evidence of pulmonic stenosis. Aorta: The aortic root is normal in size and structure. Venous: The inferior vena cava is normal in size with greater than 50% respiratory variability, suggesting right atrial pressure of 3 mmHg. IAS/Shunts: No atrial level shunt detected by color flow Doppler.  LEFT VENTRICLE PLAX 2D LVIDd:         4.10 cm   Diastology LVIDs:          3.00 cm   LV e' medial:    2.94 cm/s LV PW:         1.60 cm   LV E/e' medial:  18.4 LV IVS:        1.50 cm   LV e' lateral:   5.77 cm/s LVOT diam:     2.10 cm   LV E/e' lateral: 9.4 LVOT Area:     3.46 cm  LEFT ATRIUM         Index LA diam:    4.40 cm 2.23 cm/m  MITRAL VALVE MV Area (PHT): 1.74 cm    SHUNTS MV Decel Time: 437 msec    Systemic Diam: 2.10 cm MV E velocity: 54.00 cm/s MV A velocity: 84.40 cm/s MV E/A ratio:  0.64 Isaias Cowman MD Electronically signed by Isaias Cowman MD Signature Date/Time: 01/25/2023/3:34:09 PM    Final    MR BRAIN WO CONTRAST  Result Date: 01/24/2023 CLINICAL DATA:  Altered mental status EXAM: MRI HEAD WITHOUT CONTRAST TECHNIQUE: Multiplanar, multiecho pulse sequences of the brain and surrounding structures were obtained without intravenous contrast. COMPARISON:  05/13/2019 MRI head, correlation is also made with 01/24/2023 CT head FINDINGS: Brain: Punctate foci of restricted diffusion with possible ADC correlate in the left parietal lobe (series 2, images 42-46), with an additional possible punctate focus left caudate (series 2, image 33). No acute hemorrhage, mass, mass effect, or midline shift. No hydrocephalus or extra-axial collection. Normal craniocervical junction. Advanced, age related cerebral atrophy, most prominent in the frontoparietal region. Encephalomalacia in the left frontal lobe correlates with the hypodensity on the same-day CT, likely sequela of remote infarct. Confluent T2 hyperintense signal in the periventricular white matter, likely the sequela of severe chronic small vessel ischemic disease. Vascular: Normal arterial flow voids. Skull and upper cervical spine: Normal marrow signal. Sinuses/Orbits: Clear paranasal sinuses. Status post bilateral lens replacements. Other: The mastoids are well aerated. T2 hyperintense lesion in the right posterior neck soft tissues, which measures up to 3.3 cm, most likely a subcutaneous cyst. IMPRESSION:  Punctate foci of restricted diffusion in the left parietal lobe and possibly the left caudate, concerning for acute or subacute infarcts. These results will be called to the ordering clinician or  representative by the Radiologist Assistant, and communication documented in the PACS or [employer]. Electronically Signed   By: Merilyn Baba M.D.   On: 01/24/2023 16:49   CT HEAD WO CONTRAST (5MM)  Result Date: 01/24/2023 CLINICAL DATA:  Altered mental status EXAM: CT HEAD WITHOUT CONTRAST TECHNIQUE: Contiguous axial images were obtained from the base of the skull through the vertex without intravenous contrast. RADIATION DOSE REDUCTION: This exam was performed according to the departmental dose-optimization program which includes automated exposure control, adjustment of the mA and/or kV according to patient size and/or use of iterative reconstruction technique. COMPARISON:  05/08/2022 FINDINGS: Brain: Hypodensity in the left frontal lobe (series 2, image 19) is new from the prior exam but technically age indeterminate. No evidence of acute hemorrhage, mass, mass effect, or midline shift. No hydrocephalus or extra-axial fluid collection. Age related cerebral atrophy, with ex vacuo dilatation of the ventricles. Periventricular white matter changes, likely the sequela of chronic small vessel ischemic disease. Vascular: No hyperdense vessel. Atherosclerotic calcifications in the intracranial carotid and vertebral arteries. Skull: Negative for fracture or focal lesion. Sinuses/Orbits: Minimal mucosal thickening in the ethmoid air cells. No acute finding in the imaged orbits. Other: The mastoid air cells are well aerated. IMPRESSION: 1. Hypodensity in the left frontal lobe, new from the prior exam but technically age indeterminate. MRI could be considered for further evaluation. 2. No acute intracranial hemorrhage. These results were called by telephone at the time of interpretation on 01/24/2023 at 2:40 pm to  provider Duffy Bruce , who verbally acknowledged these results. Electronically Signed   By: Merilyn Baba M.D.   On: 01/24/2023 14:43   DG Chest Portable 1 View  Result Date: 01/24/2023 CLINICAL DATA:  Weakness. EXAM: PORTABLE CHEST 1 VIEW COMPARISON:  May 08, 2022. FINDINGS: Stable cardiomegaly. Both lungs are clear. The visualized skeletal structures are unremarkable. IMPRESSION: No active disease. Electronically Signed   By: Marijo Conception M.D.   On: 01/24/2023 14:28    Microbiology: Results for orders placed or performed during the hospital encounter of 05/08/22  Culture, blood (Routine X 2) w Reflex to ID Panel     Status: None   Collection Time: 05/08/22  2:24 PM   Specimen: BLOOD  Result Value Ref Range Status   Specimen Description BLOOD BLOOD LEFT FOREARM  Final   Special Requests   Final    BOTTLES DRAWN AEROBIC AND ANAEROBIC Blood Culture adequate volume   Culture   Final    NO GROWTH 5 DAYS Performed at [facility], [address] [place], [place] [address]    Report Status 05/13/2022 FINAL  Final  Culture, blood (Routine X 2) w Reflex to ID Panel     Status: None   Collection Time: 05/08/22  2:24 PM   Specimen: BLOOD  Result Value Ref Range Status   Specimen Description BLOOD RIGHT ANTECUBITAL  Final   Special Requests   Final    BOTTLES DRAWN AEROBIC AND ANAEROBIC Blood Culture adequate volume   Culture   Final    NO GROWTH 5 DAYS Performed at [facility], [address] [place],  [address]    Report Status 05/13/2022 FINAL  Final    Labs: CBC: Recent Labs  Lab 01/24/23 1323 01/25/23 1133 01/28/23 0446  WBC 5.4 7.6 8.7  NEUTROABS  --  4.0 4.4  HGB 11.5* 11.2* 10.5*  HCT 34.8* 33.8* 30.9*  MCV 95.9 94.4 93.4  PLT 286 306 275   Basic  Metabolic Panel: Recent Labs  Lab 01/24/23 1323 01/24/23 2046 01/25/23 1133 01/26/23 1121 01/28/23 0446  NA 142  --  139 140 140  K 3.2*  --  3.3* 3.5 3.4*  CL 102  --  102 105 101   CO2 27  --  24 26 22   GLUCOSE 85  --  107* 93 78  BUN 23  --  20 18 21   CREATININE 1.74*  --  1.55* 1.50* 1.70*  CALCIUM 9.0  --  8.7* 8.6* 8.9  MG  --  1.7  --   --   --    Liver Function Tests: Recent Labs  Lab 01/24/23 1323  AST 14*  ALT 9  ALKPHOS 46  BILITOT 1.3*  PROT 6.2*  ALBUMIN 3.6   CBG: No results for input(s): "GLUCAP" in the last 168 hours.  Discharge time spent: 39 minutes.   Signed: Hosie Poisson, MD [facility] 01/28/2023

## 2023-01-28 NOTE — Progress Notes (Signed)
Received MD order to discharge patient to home with home health.  Reviewed discharge instructions, follow up appointments, homes meds and prescriptions with daughter Melynda Keller and patient and they verbalized understanding.

## 2023-01-28 NOTE — Progress Notes (Signed)
Initial Nutrition Assessment  DOCUMENTATION CODES:   Non-severe (moderate) malnutrition in context of social or environmental circumstances  INTERVENTION:   -30 ml Prosource Plus TID, each supplement provides 100 kcals and 15 grams -MVI with minerals daily -Magic cup TID with meals, each supplement provides 290 kcal and 9 grams of protein  -Obtain new wt -Liberalize diet to regular for wider variety of meal selections -Feeding assistance with meals  NUTRITION DIAGNOSIS:   Moderate Malnutrition related to social / environmental circumstances as evidenced by mild fat depletion, mild muscle depletion, moderate muscle depletion.  GOAL:   Patient will meet greater than or equal to 90% of their needs  MONITOR:   PO intake, Supplement acceptance  REASON FOR ASSESSMENT:   Consult Assessment of nutrition requirement/status  ASSESSMENT:   Pt with history of hypertension who presents for chief concerns of change in mentation.  Pt admitted with stroke and AMS.   Reviewed I/O's: 0 ml x 24 hours and +2.4 L since admission  UOP: 200 ml x 24 hours  Per H&P, MRI of brain revealed lt parietal lobe and possible lt caudate. Pt did not talk and refused to eat and drink over the past 2 days PTA. Noted pt's wife was recently admitted to SNF and that this is suspected to have contributed to pt's decline.   Pt lying in bed at time of visit. He was able to acknowledge this RD when greeted, but did not provide further history. Pt turned away from RD and rested in fetal position. RD attempted to engage pt to provide further history. Pt shares that he does not feel good. When asked if he was in pain, he nodded and asked where, he pointed towards his neck.   Pt currently on a heart healthy diet. Per SLP notes, pt was on a regular consistency diet at home. Pt waiting formal SLP consult. No meal completion data available to assess at this time.   Reviewed wt hx; no current wt noted (last wt taken on  06/19/23). Suspect last few wts may be stated weights. RD will order new wt to better assess wt changes.   Palliative care following for goals of care discussions.   Per TOC notes, plan to d/c home with home health services once medically stable.   Medications reviewed.   Lab Results  Component Value Date   HGBA1C 5.1 01/26/2023   PTA DM medications are none.   Labs reviewed: K: 3.4, CBGS: 145 (inpatient orders for glycemic control are none).    NUTRITION - FOCUSED PHYSICAL EXAM:  Flowsheet Row Most Recent Value  Orbital Region No depletion  Upper Arm Region Moderate depletion  Thoracic and Lumbar Region Mild depletion  Buccal Region Mild depletion  Temple Region Mild depletion  Clavicle Bone Region Mild depletion  Clavicle and Acromion Bone Region Mild depletion  Scapular Bone Region Mild depletion  Dorsal Hand Mild depletion  Patellar Region Moderate depletion  Anterior Thigh Region Moderate depletion  Posterior Calf Region Moderate depletion  Edema (RD Assessment) None  Hair Reviewed  Eyes Reviewed  Mouth Reviewed  Skin Reviewed  Nails Reviewed       Diet Order:   Diet Order             Diet Heart Room service appropriate? Yes; Fluid consistency: Thin  Diet effective now                   EDUCATION NEEDS:   Education needs have been addressed  Skin:  Skin Assessment: Reviewed RN Assessment  Last BM:  01/25/23  Height:   Ht Readings from Last 1 Encounters:  06/18/22 5\' 10"  (1.778 m)    Weight:   Wt Readings from Last 1 Encounters:  06/18/22 79.4 kg    Ideal Body Weight:  75.5 kg  BMI:  There is no height or weight on file to calculate BMI.  Estimated Nutritional Needs:   Kcal:  2050-2250  Protein:  100-115 grams  Fluid:  > 2 L    Loistine Chance, RD, LDN, [place] Registered Dietitian II Certified Diabetes Care and Education Specialist Please refer to [facility] for RD and/or RD on-call/weekend/after hours pager

## 2023-01-28 NOTE — Progress Notes (Signed)
Physical Therapy Treatment Patient Details Name: Charles Sims MRN: [id number] DOB: 1930/08/26 Today's Date: 01/28/2023   History of Present Illness Pt is 89 y/o male with PHM including CAD, CVA, chest pain, syncope, PNA, AMS. Here with acute CVA. At home, pt was having weakness and AMS (grandson does report baseline dementia as well), decreased PO and medication intake, decreased communication. Of note, wife currently has hip fx and is in short term rehab and this appears to have affected pt as well.    PT Comments    Pt required encouragement and motivation for participation during the session which daughter reported was baseline for pt.  Pt required min A with bed mobility tasks but no physical assist with transfers or gait.  Pt required frequent cuing for general sequencing with each but was generally steady with no overt LOB in standing.  Daughter present throughout the session and after observing pt's functional mobility reported that she desired to take him home at discharge with HHPT and felt comfortable doing that.  Pt will benefit from continued PT services upon discharge to safely address deficits listed in patient problem list for decreased caregiver assistance and eventual return to PLOF.     Recommendations for follow up therapy are one component of a multi-disciplinary discharge planning process, led by the attending physician.  Recommendations may be updated based on patient status, additional functional criteria and insurance authorization.  Follow Up Recommendations       Assistance Recommended at Discharge Frequent or constant Supervision/Assistance  Patient can return home with the following A little help with walking and/or transfers;A little help with bathing/dressing/bathroom;Assistance with cooking/housework;Direct supervision/assist for medications management;Help with stairs or ramp for entrance;Assist for transportation   Equipment Recommendations  None recommended by  PT    Recommendations for Other Services       Precautions / Restrictions Precautions Precautions: Fall Restrictions Weight Bearing Restrictions: No     Mobility  Bed Mobility Overal bed mobility: Needs Assistance       Supine to sit: Min assist     General bed mobility comments: Min A for trunk to full upright position    Transfers Overall transfer level: Needs assistance Equipment used: Rolling walker (2 wheels) Transfers: Sit to/from Stand Sit to Stand: Min guard           General transfer comment: Good eccentric and concentric control and stability with mod verbal cues for hand placement    Ambulation/Gait Ambulation/Gait assistance: Min guard Gait Distance (Feet): 40 Feet Assistive device: Rolling walker (2 wheels) Gait Pattern/deviations: Step-through pattern, Decreased step length - right, Decreased step length - left, Trunk flexed Gait velocity: decreased     General Gait Details: Slow cadence with short B step lenght and mod verbal cues for amb closer to the RW with upright posture but generally steady without LOB   Stairs             Wheelchair Mobility    Modified Rankin (Stroke Patients Only)       Balance Overall balance assessment: Needs assistance Sitting-balance support: Feet supported Sitting balance-Leahy Scale: Good     Standing balance support: Bilateral upper extremity supported, During functional activity, Reliant on assistive device for balance Standing balance-Leahy Scale: Fair                              Cognition Arousal/Alertness: Lethargic Behavior During Therapy: Flat affect Overall Cognitive Status: History of cognitive  impairments - at baseline                                          Exercises Other Exercises Other Exercises: Multiple sit to/from stand transfer training from various height surfaces    General Comments        Pertinent Vitals/Pain Pain Assessment Pain  Assessment: No/denies pain    Home Living                          Prior Function            PT Goals (current goals can now be found in the care plan section) Progress towards PT goals: Progressing toward goals    Frequency    Min 2X/week      PT Plan Current plan remains appropriate    Co-evaluation              AM-PAC PT "6 Clicks" Mobility   Outcome Measure  Help needed turning from your back to your side while in a flat bed without using bedrails?: A Little Help needed moving from lying on your back to sitting on the side of a flat bed without using bedrails?: A Little Help needed moving to and from a bed to a chair (including a wheelchair)?: A Little Help needed standing up from a chair using your arms (e.g., wheelchair or bedside chair)?: A Little Help needed to walk in hospital room?: A Little Help needed climbing 3-5 steps with a railing? : A Lot 6 Click Score: 17    End of Session Equipment Utilized During Treatment: Gait belt Activity Tolerance: Patient tolerated treatment well Patient left: in chair;with call bell/phone within reach;with chair alarm set;with family/visitor present Nurse Communication: Mobility status PT Visit Diagnosis: Unsteadiness on feet (R26.81);Other abnormalities of gait and mobility (R26.89);Muscle weakness (generalized) (M62.81);History of falling (Z91.81);Difficulty in walking, not elsewhere classified (R26.2);Pain     Time: [id number] PT Time Calculation (min) (ACUTE ONLY): 33 min  Charges:  $Gait Training: 8-22 mins $Therapeutic Activity: 8-22 mins                     D. Scott Dorethia Jeanmarie PT, DPT 01/28/23, 3:02 PM

## 2023-01-28 NOTE — Progress Notes (Signed)
[facility]                                                                               Charles Sims, is a 89 y.o. male, DOB - 1930/10/12, [id number] Admit date - 01/24/2023    Outpatient Primary MD for the patient is Tonia Ghent, MD  LOS - 3  days    Brief summary   Charles Sims is a 89 year old male with history of hypertension who presents emergency department for chief concerns of change in mentation.  MRI brain without contrast shows Punctate foci of restricted diffusion in the left parietal lobe and possibly the left caudate, concerning for acute or subacute infarcts. He was admitted for CVA work up.  Currently waiting for therapy evaluations.   Assessment & Plan    Assessment and Plan: Left Parietal and left caudate stroke Neurology consulted and recommendations given.  Continue with aspirin 81  mg daily, added lipitor.  MRI brain reviewed.  CTA head and neck Reviewed.  Lipid panel showed LDL is 115.  Hemoglobin A1c is around 5.  Therapy evals show home health PT OT. But patient is very tremulous and he will need 24 hour supervision.  Recommend re evaluation by PT to see if he needs SNF.  Echocardiogram ordered showed Left ventricular ejection fraction, by estimation, is 50 to 55%. The  left ventricle has low normal function. The left ventricle has no regional  wall motion abnormalities. Left ventricular diastolic parameters are  consistent with Grade I diastolic  dysfunction (impaired relaxation)  SLP eval recommended home health SLP.   Acute encephalopathy;from acute stroke and ? Dementia.  He is alert and answering simple questions. He is still confused, and oriented to self only.  Will need therapy evaluations today prior to discharge.   Hypokalemia Replaced. Repeat level wnl.   Essential hypertension Very well controlled.  Added norvasc 10 mg daily. And cozaar 25 mg daily. .   Mild normocytic anemia:  Hemoglobin stable around 11.     ? Depression,  Started him on prozac.   Poor oral intake, poor progression Palliative care consulted, discussed with family/ daughter.  Started him on prozac.  Recommend re evaluation by PT.  Nutrition consulted.  SLP evaluation done, recommended home health SLP.    Estimated body mass index is 22.28 kg/m as calculated from the following:   Height as of 06/18/22: 5\' 10"  (1.778 m).   Weight as of this encounter: 70.4 kg.  Code Status: full code.  DVT Prophylaxis:  SCD's Start: 01/24/23 1514   Level of Care: Level of care: Telemetry Medical Family Communication: None at bedside. Discussed with daughter over the phone.   Disposition Plan:     Remains inpatient appropriate:  pending THERAPY EVALUATIONS  Procedures:  MRI brain.   Consultants:   Neurology.   Antimicrobials:   Anti-infectives (From admission, onward)    None        Medications  Scheduled Meds:  amLODipine  10 mg Oral Daily   aspirin EC  81 mg Oral Daily   atorvastatin  80 mg Oral Daily   feeding supplement  237 mL Oral TID BM   FLUoxetine  20 mg Oral Daily   losartan  25 mg Oral Daily   multivitamin with minerals  1 tablet Oral Daily   potassium chloride  40 mEq Oral Once   Continuous Infusions:   PRN Meds:.acetaminophen **OR** acetaminophen (TYLENOL) oral liquid 160 mg/5 mL **OR** acetaminophen, polyethylene glycol, senna-docusate    Subjective:   Charles Sims was seen and examined today. Alert and comfortable, oriented to self.    Objective:   Vitals:   01/28/23 0725 01/28/23 0917 01/28/23 0920 01/28/23 1200  BP: (!) 132/57 (!) 123/57 132/64   Pulse: 62 63 66   Resp: 18     Temp: 97.8 F (36.6 C)     TempSrc:      SpO2: 99%     Weight:    70.4 kg    Intake/Output Summary (Last 24 hours) at 01/28/2023 1324 Last data filed at 01/27/2023 2255 Gross per 24 hour  Intake 200 ml  Output 100 ml  Net 100 ml    Filed Weights   01/28/23 1200  Weight: 70.4 kg      Exam General exam: Appears calm and comfortable  Respiratory system: Clear to auscultation. Respiratory effort normal. Cardiovascular system: S1 & S2 heard, RRR. No JVD, Gastrointestinal system: Abdomen is nondistended, soft and nontender.  Central nervous system: Alert and oriented to self, confused, able to move all extremities.  Extremities: no pedal edema.  Skin: No rashes, lesions or ulcers Psychiatry: confused, unable to assess.       Data Reviewed:  I have personally reviewed following labs and imaging studies   CBC Lab Results  Component Value Date   WBC 8.7 01/28/2023   RBC 3.31 (L) 01/28/2023   HGB 10.5 (L) 01/28/2023   HCT 30.9 (L) 01/28/2023   MCV 93.4 01/28/2023   MCH 31.7 01/28/2023   PLT 275 01/28/2023   MCHC 34.0 01/28/2023   RDW 13.7 01/28/2023   LYMPHSABS 3.4 01/28/2023   MONOABS 0.6 01/28/2023   EOSABS 0.2 01/28/2023   BASOSABS 0.0 [id number]     Last metabolic panel Lab Results  Component Value Date   NA 140 01/28/2023   K 3.4 (L) 01/28/2023   CL 101 01/28/2023   CO2 22 01/28/2023   BUN 21 01/28/2023   CREATININE 1.70 (H) 01/28/2023   GLUCOSE 78 01/28/2023   GFRNONAA 38 (L) 01/28/2023   GFRAA 44 (L) 05/16/2019   CALCIUM 8.9 01/28/2023   PROT 6.2 (L) 01/24/2023   ALBUMIN 3.6 01/24/2023   BILITOT 1.3 (H) 01/24/2023   ALKPHOS 46 01/24/2023   AST 14 (L) 01/24/2023   ALT 9 01/24/2023   ANIONGAP 17 (H) 01/28/2023    CBG (last 3)  No results for input(s): "GLUCAP" in the last 72 hours.    Coagulation Profile: No results for input(s): "INR", "PROTIME" in the last 168 hours.   Radiology Studies: No results found.     Hosie Poisson M.D. [facility] 01/28/2023, 1:24 PM  Available via Epic secure chat 7am-7pm After 7 pm, please refer to night coverage provider listed on amion.

## 2023-01-28 NOTE — Care Management Important Message (Signed)
Important Message  Patient Details  Name: DUELL CIRAULO MRN: [id number] Date of Birth: Apr 26, 1930   Medicare Important Message Given:  Yes     Dannette Barbara 01/28/2023, 11:31 AM

## 2023-01-31 ENCOUNTER — Ambulatory Visit: Payer: Medicare HMO | Admitting: Family Medicine

## 2023-02-01 DIAGNOSIS — R531 Weakness: Secondary | ICD-10-CM | POA: Diagnosis not present

## 2023-02-01 DIAGNOSIS — R4182 Altered mental status, unspecified: Secondary | ICD-10-CM | POA: Diagnosis not present

## 2023-02-01 DIAGNOSIS — N401 Enlarged prostate with lower urinary tract symptoms: Secondary | ICD-10-CM | POA: Diagnosis not present

## 2023-02-01 DIAGNOSIS — D500 Iron deficiency anemia secondary to blood loss (chronic): Secondary | ICD-10-CM | POA: Diagnosis not present

## 2023-02-01 DIAGNOSIS — E440 Moderate protein-calorie malnutrition: Secondary | ICD-10-CM | POA: Diagnosis not present

## 2023-02-01 DIAGNOSIS — I69398 Other sequelae of cerebral infarction: Secondary | ICD-10-CM | POA: Diagnosis not present

## 2023-02-01 DIAGNOSIS — I119 Hypertensive heart disease without heart failure: Secondary | ICD-10-CM | POA: Diagnosis not present

## 2023-02-01 DIAGNOSIS — I2510 Atherosclerotic heart disease of native coronary artery without angina pectoris: Secondary | ICD-10-CM | POA: Diagnosis not present

## 2023-02-01 DIAGNOSIS — Z7982 Long term (current) use of aspirin: Secondary | ICD-10-CM | POA: Diagnosis not present

## 2023-02-05 DIAGNOSIS — R4182 Altered mental status, unspecified: Secondary | ICD-10-CM | POA: Diagnosis not present

## 2023-02-05 DIAGNOSIS — E440 Moderate protein-calorie malnutrition: Secondary | ICD-10-CM | POA: Diagnosis not present

## 2023-02-05 DIAGNOSIS — I2510 Atherosclerotic heart disease of native coronary artery without angina pectoris: Secondary | ICD-10-CM | POA: Diagnosis not present

## 2023-02-05 DIAGNOSIS — D500 Iron deficiency anemia secondary to blood loss (chronic): Secondary | ICD-10-CM | POA: Diagnosis not present

## 2023-02-05 DIAGNOSIS — R531 Weakness: Secondary | ICD-10-CM | POA: Diagnosis not present

## 2023-02-05 DIAGNOSIS — Z7982 Long term (current) use of aspirin: Secondary | ICD-10-CM | POA: Diagnosis not present

## 2023-02-05 DIAGNOSIS — I69398 Other sequelae of cerebral infarction: Secondary | ICD-10-CM | POA: Diagnosis not present

## 2023-02-05 DIAGNOSIS — N401 Enlarged prostate with lower urinary tract symptoms: Secondary | ICD-10-CM | POA: Diagnosis not present

## 2023-02-05 DIAGNOSIS — I119 Hypertensive heart disease without heart failure: Secondary | ICD-10-CM | POA: Diagnosis not present

## 2023-02-07 DIAGNOSIS — Z8673 Personal history of transient ischemic attack (TIA), and cerebral infarction without residual deficits: Secondary | ICD-10-CM | POA: Diagnosis not present

## 2023-02-07 DIAGNOSIS — R479 Unspecified speech disturbances: Secondary | ICD-10-CM | POA: Diagnosis not present

## 2023-02-07 DIAGNOSIS — R2689 Other abnormalities of gait and mobility: Secondary | ICD-10-CM | POA: Diagnosis not present

## 2023-02-07 DIAGNOSIS — I10 Essential (primary) hypertension: Secondary | ICD-10-CM | POA: Diagnosis not present

## 2023-02-07 DIAGNOSIS — F411 Generalized anxiety disorder: Secondary | ICD-10-CM | POA: Diagnosis not present

## 2023-02-11 ENCOUNTER — Telehealth: Payer: Self-pay | Admitting: Family Medicine

## 2023-02-11 NOTE — Telephone Encounter (Signed)
Verbal orders have been given to Stacy at [facility].

## 2023-02-11 NOTE — Telephone Encounter (Signed)
Home Health verbal orders Caller Name:STACY DAVIS Agency Name: [facility]  Callback number: [phone]  Requesting OT/PT/Skilled nursing/Social Work/Speech: PT   Reason: HOSPITAL  DISCHARGE;STROKE   Frequency:  Please forward to [facility] pool or providers CMA    PATIENT IS SCHEDULED TO ESTABLISH CARE WITH [facility] ON TOMORROW 02/12/2023. HH NURSE CALLED IN WITH THESE ORDERS HOPING DR Para March WOULD BE ABLE TO SIGN OFF ON THEM.SHE SAID THAT SHE HOPES THE PATIENT COMES IN FOR HIS APPOINTMENT DUE TO HIM BEING VERY STUBBORNED.HE WOULDTN EVEN GET OUT OF THE BED FOR HER TODAY.

## 2023-02-11 NOTE — Telephone Encounter (Signed)
I can't give orders on patients that I haven't seen.  If he is seen here, then we can potentially give the order.

## 2023-02-12 ENCOUNTER — Ambulatory Visit (INDEPENDENT_AMBULATORY_CARE_PROVIDER_SITE_OTHER): Payer: Medicare HMO | Admitting: Family Medicine

## 2023-02-12 ENCOUNTER — Encounter: Payer: Self-pay | Admitting: Family Medicine

## 2023-02-12 VITALS — BP 100/62 | HR 73 | Temp 97.7°F | Wt 150.0 lb

## 2023-02-12 DIAGNOSIS — D649 Anemia, unspecified: Secondary | ICD-10-CM

## 2023-02-12 DIAGNOSIS — R413 Other amnesia: Secondary | ICD-10-CM | POA: Diagnosis not present

## 2023-02-12 LAB — CBC WITH DIFFERENTIAL/PLATELET
Basophils Absolute: 0 10*3/uL (ref 0.0–0.1)
Basophils Relative: 0.4 % (ref 0.0–3.0)
Eosinophils Absolute: 0.2 10*3/uL (ref 0.0–0.7)
Eosinophils Relative: 2.5 % (ref 0.0–5.0)
HCT: 32.3 % — ABNORMAL LOW (ref 39.0–52.0)
Hemoglobin: 11 g/dL — ABNORMAL LOW (ref 13.0–17.0)
Lymphocytes Relative: 42.4 % (ref 12.0–46.0)
Lymphs Abs: 2.6 10*3/uL (ref 0.7–4.0)
MCHC: 34.1 g/dL (ref 30.0–36.0)
MCV: 93.5 fl (ref 78.0–100.0)
Monocytes Absolute: 0.4 10*3/uL (ref 0.1–1.0)
Monocytes Relative: 6.8 % (ref 3.0–12.0)
Neutro Abs: 3 10*3/uL (ref 1.4–7.7)
Neutrophils Relative %: 47.9 % (ref 43.0–77.0)
Platelets: 317 10*3/uL (ref 150.0–400.0)
RBC: 3.45 Mil/uL — ABNORMAL LOW (ref 4.22–5.81)
RDW: 14.5 % (ref 11.5–15.5)
WBC: 6.2 10*3/uL (ref 4.0–10.5)

## 2023-02-12 LAB — FERRITIN: Ferritin: 510.9 ng/mL — ABNORMAL HIGH (ref 22.0–322.0)

## 2023-02-12 LAB — BASIC METABOLIC PANEL
BUN: 31 mg/dL — ABNORMAL HIGH (ref 6–23)
CO2: 29 mEq/L (ref 19–32)
Calcium: 9.4 mg/dL (ref 8.4–10.5)
Chloride: 106 mEq/L (ref 96–112)
Creatinine, Ser: 1.92 mg/dL — ABNORMAL HIGH (ref 0.40–1.50)
GFR: 30.15 mL/min — ABNORMAL LOW (ref >60.00)
Glucose, Bld: 102 mg/dL — ABNORMAL HIGH (ref 70–99)
Potassium: 4.1 mEq/L (ref 3.5–5.1)
Sodium: 144 mEq/L (ref 135–145)

## 2023-02-12 LAB — IRON: Iron: 95 ug/dL (ref 42–165)

## 2023-02-12 MED ORDER — AMLODIPINE BESYLATE 10 MG PO TABS: 10.0000 mg | ORAL_TABLET | Freq: Every day | ORAL | Status: DC

## 2023-02-12 NOTE — Patient Instructions (Addendum)
Let me check on social work and home health orders.  Labs today.  Take care.  Glad to see you. See if you can get a copy of the power of attorney paperwork or any advance directives.

## 2023-02-12 NOTE — Progress Notes (Unsigned)
New patient.  Inpatient f/u.    Here today with daughter and son-in-law.  Home situation is complicated because his wife needs extra care at home due to a broken leg.  Family reports history of irritability improved on prozac.    Can reportedly occ get lightheaded on standing.  Cautions d/w pt.  Amlodipine recently inc'd to 10mg  a day.  Family had given 15mg  by accident today, d/w pt and family about taking 10 mg/day.  CKD at baseline.  Discussed his baseline creatinine and expected changes given his age.  I asked him about his advance directive.  Per report of family, he had designated his daughter as [place].  Has memory loss prior to the CVA.  He recognizes family, some of the time.  Variable intake of solids. Some days he is not getting out of bed.  Family is caring for patient.    Family reported longstanding shuffling gait with need for walker/wheelchair to limit his fall risk.  History is limited from patient.  He speaks sentences with only a few words, briefly.  This is only when prompted.  I presume this is related to his pre-existing memory loss.  Attempted to discuss his goals of care.  If he can't stay at home, the plan is to move to daughter's house.   Offered SW help- needs referral to see about medicaid.    History of anemia with recheck labs pending.  Meds, vitals, and allergies reviewed.   ROS: Per HPI unless specifically indicated in ROS section   Nad In wheelchair Neck supple, no LA Rrr Ctab Abd soft, not ttp Ext w/o edema.  Skin well-perfused. Speaks in only short sentences.    Recheck labs pending.  30 minutes were devoted to patient care in this encounter (this includes time spent reviewing the patient's file/history, interviewing and examining the patient, counseling/reviewing plan with patient).

## 2023-02-13 ENCOUNTER — Encounter: Payer: Self-pay | Admitting: Family Medicine

## 2023-02-13 DIAGNOSIS — I119 Hypertensive heart disease without heart failure: Secondary | ICD-10-CM

## 2023-02-13 DIAGNOSIS — R413 Other amnesia: Secondary | ICD-10-CM | POA: Insufficient documentation

## 2023-02-13 DIAGNOSIS — Z7982 Long term (current) use of aspirin: Secondary | ICD-10-CM

## 2023-02-13 DIAGNOSIS — N401 Enlarged prostate with lower urinary tract symptoms: Secondary | ICD-10-CM

## 2023-02-13 DIAGNOSIS — E440 Moderate protein-calorie malnutrition: Secondary | ICD-10-CM

## 2023-02-13 DIAGNOSIS — D500 Iron deficiency anemia secondary to blood loss (chronic): Secondary | ICD-10-CM

## 2023-02-13 DIAGNOSIS — I69398 Other sequelae of cerebral infarction: Secondary | ICD-10-CM

## 2023-02-13 DIAGNOSIS — R531 Weakness: Secondary | ICD-10-CM

## 2023-02-13 DIAGNOSIS — R4182 Altered mental status, unspecified: Secondary | ICD-10-CM

## 2023-02-13 DIAGNOSIS — I2510 Atherosclerotic heart disease of native coronary artery without angina pectoris: Secondary | ICD-10-CM

## 2023-02-13 NOTE — Assessment & Plan Note (Addendum)
I suspect he has multifactorial memory loss with underlying dementia and vascular disease.  Discussed.  I asked his daughter to see if she can get a copy of the power of attorney paperwork or any advance directives.  Attempted to discuss his goals of care.  If he can't stay at home, the plan is to move to daughter's house.   Offered SW help-social work referral placed.  Home health orders have been given in the meantime.  Discussed vascular risk and would continue amlodipine 10 mg and aspirin 81 mg and Lipitor 80 mg.  Recheck labs pending.  I am meeting him for the first time today but it appears that trying to arrange for home health and social work, recheck his labs, and addressing his current medication list would be the most pressing issues.

## 2023-02-14 ENCOUNTER — Telehealth: Payer: Self-pay | Admitting: *Deleted

## 2023-02-14 ENCOUNTER — Other Ambulatory Visit: Payer: Self-pay | Admitting: Family Medicine

## 2023-02-14 ENCOUNTER — Telehealth: Payer: Self-pay | Admitting: Family Medicine

## 2023-02-14 ENCOUNTER — Encounter: Payer: Self-pay | Admitting: Family Medicine

## 2023-02-14 DIAGNOSIS — R413 Other amnesia: Secondary | ICD-10-CM

## 2023-02-14 DIAGNOSIS — I4891 Unspecified atrial fibrillation: Secondary | ICD-10-CM | POA: Insufficient documentation

## 2023-02-14 DIAGNOSIS — N183 Chronic kidney disease, stage 3 unspecified: Secondary | ICD-10-CM

## 2023-02-14 NOTE — Progress Notes (Signed)
  Care Coordination   Note   02/14/2023 Name: [name] MRN: [id number] DOB: [birth year]  Charles Sims is a 89 y.o. year old male who sees Joaquim Nam, MD for primary care. I reached out to Donnie Coffin by phone today to offer care coordination services.  [name] was given information about Care Coordination services today including:   The Care Coordination services include support from the care team which includes your Nurse Coordinator, Clinical Social Worker, or Pharmacist.  The Care Coordination team is here to help remove barriers to the health concerns and goals most important to you. Care Coordination services are voluntary, and the patient may decline or stop services at any time by request to their care team member.   Care Coordination Consent Status: Patient agreed to services and verbal consent obtained.   Follow up plan:  Telephone appointment with care coordination team member scheduled for:  02/18/2023  Encounter Outcome:  Pt. Scheduled from referral   Burman Nieves, [facility] Care Coordination Care Guide Direct Dial: [phone]

## 2023-02-14 NOTE — Telephone Encounter (Signed)
Minna Merritts from [facility] contacted the office regarding this patient. Stated they received a palliative care order however, patient's daughter and care team are in agreement that patient is more hospice eligible. Would like for provide to send a hospice order in for patient. Wouuld like this via fax, number and contacts listed below.   Fax: [phone], Attn to Delcie Roch work # [phone]

## 2023-02-14 NOTE — Telephone Encounter (Signed)
I put in the referral.  Please give the order.  I can attend but I would like them to help with symptom management.  Thanks.

## 2023-02-14 NOTE — Telephone Encounter (Signed)
I will send the referral to the correct location.   When the referral was placed there was no location mentioned in the referral comments.

## 2023-02-18 ENCOUNTER — Ambulatory Visit: Payer: Self-pay | Admitting: *Deleted

## 2023-02-19 ENCOUNTER — Telehealth: Payer: Self-pay | Admitting: Family Medicine

## 2023-02-19 NOTE — Telephone Encounter (Signed)
Family member came in office to drop off  Do not resuscitate paperwork for PCP to review . It's place in PCP folder

## 2023-02-19 NOTE — Patient Instructions (Signed)
Visit Information  Thank you for taking time to visit with me today. Please don't hesitate to contact me if I can be of assistance to you.   Following are the goals we discussed today:   Goals Addressed             This Visit's Progress    care coordination activities       Care Coordination Interventions: Confirmed that patient is now active with Hospice Care through [facility] Confirmed with patient's daughter desire to change patient's code status-agreeable to bring paperwork to doctor's office to review Confirmed having no additional community resource needs at this time          If you are experiencing a Mental Health or Behavioral Health Crisis or need someone to talk to, please call 911   Patient verbalizes understanding of instructions and care plan provided today and agrees to view in MyChart. Active MyChart status and patient understanding of how to access instructions and care plan via MyChart confirmed with patient.     No further follow up required: patient now active with Hospice  [facility], [place] Clinical Social Worker  [facility] Care Management [phone]

## 2023-02-19 NOTE — Patient Outreach (Signed)
  Care Coordination   Initial Visit Note   02/19/2023 Late Entry Name: Charles Sims MRN: [id number] DOB: September 19, 1930  Charles Sims is a 89 y.o. year old male who sees Joaquim Nam, MD for primary care. I spoke with  Charles Sims 's daughter by phone on 02/18/23.  What matters to the patients health and wellness today?  Patient's daughter confirms that patient is now active with Hospice and confirms having no additional community resource needs at this time.   Goals Addressed             This Visit's Progress    care coordination activities       Care Coordination Interventions: Confirmed that patient is now active with Hospice Care through [facility] Confirmed with patient's daughter desire to change patient's code status-agreeable to bring paperwork to doctor's office to review Confirmed having no additional community resource needs at this time           SDOH assessments and interventions completed:  Yes  SDOH Interventions Today    Flowsheet Row Most Recent Value  SDOH Interventions   Food Insecurity Interventions Intervention Not Indicated  Housing Interventions Intervention Not Indicated  Transportation Interventions Intervention Not Indicated        Care Coordination Interventions:  Yes, provided  Interventions Today    Flowsheet Row Most Recent Value  Chronic Disease   Chronic disease during today's visit Other  [anemia, memory loss]  General Interventions   General Interventions Discussed/Reviewed [employer]  [patient now active with hospice through [facility]]  Nutrition Interventions   Nutrition Discussed/Reviewed Nutrition Discussed  Safety Interventions   Safety Discussed/Reviewed Safety Discussed  Advanced Directive Interventions   Advanced Directives Discussed/Reviewed Advanced Directives Discussed  [Encouraged to bring Advanced Directive to PCP]       Follow up plan: No further intervention required.    Encounter Outcome:  Pt. Visit Completed

## 2023-02-19 NOTE — Telephone Encounter (Signed)
Paperwork has been placed in Dr. Lianne Bushy inbox

## 2023-02-25 NOTE — Telephone Encounter (Signed)
error 

## 2023-05-17 ENCOUNTER — Emergency Department

## 2023-05-17 ENCOUNTER — Inpatient Hospital Stay
Admission: EM | Admit: 2023-05-17 | Discharge: 2023-05-22 | DRG: 948 | Disposition: A | Discharge status: Skilled Nursing Facility | Attending: Student in an Organized Health Care Education/Training Program | Admitting: Student in an Organized Health Care Education/Training Program

## 2023-05-17 ENCOUNTER — Observation Stay

## 2023-05-17 ENCOUNTER — Other Ambulatory Visit: Payer: Self-pay

## 2023-05-17 DIAGNOSIS — K219 Gastro-esophageal reflux disease without esophagitis: Secondary | ICD-10-CM | POA: Diagnosis present

## 2023-05-17 DIAGNOSIS — F0393 Unspecified dementia, unspecified severity, with mood disturbance: Secondary | ICD-10-CM | POA: Diagnosis present

## 2023-05-17 DIAGNOSIS — R000 Tachycardia, unspecified: Secondary | ICD-10-CM | POA: Diagnosis not present

## 2023-05-17 DIAGNOSIS — N401 Enlarged prostate with lower urinary tract symptoms: Secondary | ICD-10-CM | POA: Diagnosis present

## 2023-05-17 DIAGNOSIS — I160 Hypertensive urgency: Secondary | ICD-10-CM | POA: Diagnosis present

## 2023-05-17 DIAGNOSIS — I130 Hypertensive heart and chronic kidney disease with heart failure and stage 1 through stage 4 chronic kidney disease, or unspecified chronic kidney disease: Secondary | ICD-10-CM | POA: Diagnosis present

## 2023-05-17 DIAGNOSIS — R4182 Altered mental status, unspecified: Principal | ICD-10-CM | POA: Diagnosis present

## 2023-05-17 DIAGNOSIS — I959 Hypotension, unspecified: Secondary | ICD-10-CM | POA: Diagnosis not present

## 2023-05-17 DIAGNOSIS — Z87891 Personal history of nicotine dependence: Secondary | ICD-10-CM

## 2023-05-17 DIAGNOSIS — F32A Depression, unspecified: Secondary | ICD-10-CM | POA: Diagnosis present

## 2023-05-17 DIAGNOSIS — N1832 Chronic kidney disease, stage 3b: Secondary | ICD-10-CM | POA: Diagnosis not present

## 2023-05-17 DIAGNOSIS — I5032 Chronic diastolic (congestive) heart failure: Secondary | ICD-10-CM | POA: Diagnosis present

## 2023-05-17 DIAGNOSIS — Z7982 Long term (current) use of aspirin: Secondary | ICD-10-CM

## 2023-05-17 DIAGNOSIS — Z8673 Personal history of transient ischemic attack (TIA), and cerebral infarction without residual deficits: Secondary | ICD-10-CM | POA: Diagnosis not present

## 2023-05-17 DIAGNOSIS — Z9861 Coronary angioplasty status: Secondary | ICD-10-CM

## 2023-05-17 DIAGNOSIS — I700 Atherosclerosis of aorta: Secondary | ICD-10-CM | POA: Diagnosis not present

## 2023-05-17 DIAGNOSIS — M4802 Spinal stenosis, cervical region: Secondary | ICD-10-CM | POA: Diagnosis not present

## 2023-05-17 DIAGNOSIS — Z66 Do not resuscitate: Secondary | ICD-10-CM | POA: Diagnosis present

## 2023-05-17 DIAGNOSIS — Z8051 Family history of malignant neoplasm of kidney: Secondary | ICD-10-CM | POA: Diagnosis not present

## 2023-05-17 DIAGNOSIS — Z888 Allergy status to other drugs, medicaments and biological substances status: Secondary | ICD-10-CM

## 2023-05-17 DIAGNOSIS — I639 Cerebral infarction, unspecified: Secondary | ICD-10-CM | POA: Diagnosis not present

## 2023-05-17 DIAGNOSIS — E46 Unspecified protein-calorie malnutrition: Secondary | ICD-10-CM | POA: Diagnosis not present

## 2023-05-17 DIAGNOSIS — D649 Anemia, unspecified: Secondary | ICD-10-CM | POA: Diagnosis not present

## 2023-05-17 DIAGNOSIS — Z9181 History of falling: Secondary | ICD-10-CM | POA: Diagnosis not present

## 2023-05-17 DIAGNOSIS — E785 Hyperlipidemia, unspecified: Secondary | ICD-10-CM | POA: Diagnosis present

## 2023-05-17 DIAGNOSIS — Z79899 Other long term (current) drug therapy: Secondary | ICD-10-CM

## 2023-05-17 DIAGNOSIS — R0989 Other specified symptoms and signs involving the circulatory and respiratory systems: Secondary | ICD-10-CM | POA: Diagnosis not present

## 2023-05-17 DIAGNOSIS — R001 Bradycardia, unspecified: Secondary | ICD-10-CM | POA: Diagnosis present

## 2023-05-17 DIAGNOSIS — R0689 Other abnormalities of breathing: Secondary | ICD-10-CM | POA: Diagnosis not present

## 2023-05-17 DIAGNOSIS — R627 Adult failure to thrive: Secondary | ICD-10-CM | POA: Diagnosis not present

## 2023-05-17 DIAGNOSIS — Z6821 Body mass index (BMI) 21.0-21.9, adult: Secondary | ICD-10-CM

## 2023-05-17 DIAGNOSIS — I482 Chronic atrial fibrillation, unspecified: Secondary | ICD-10-CM | POA: Diagnosis not present

## 2023-05-17 DIAGNOSIS — I2510 Atherosclerotic heart disease of native coronary artery without angina pectoris: Secondary | ICD-10-CM | POA: Diagnosis not present

## 2023-05-17 DIAGNOSIS — I6782 Cerebral ischemia: Secondary | ICD-10-CM | POA: Diagnosis not present

## 2023-05-17 DIAGNOSIS — Z7401 Bed confinement status: Secondary | ICD-10-CM | POA: Diagnosis not present

## 2023-05-17 DIAGNOSIS — Z820 Family history of epilepsy and other diseases of the nervous system: Secondary | ICD-10-CM

## 2023-05-17 DIAGNOSIS — I5A Non-ischemic myocardial injury (non-traumatic): Secondary | ICD-10-CM | POA: Diagnosis present

## 2023-05-17 DIAGNOSIS — I10 Essential (primary) hypertension: Secondary | ICD-10-CM | POA: Diagnosis present

## 2023-05-17 DIAGNOSIS — R0902 Hypoxemia: Secondary | ICD-10-CM | POA: Diagnosis not present

## 2023-05-17 DIAGNOSIS — F0390 Unspecified dementia without behavioral disturbance: Secondary | ICD-10-CM

## 2023-05-17 DIAGNOSIS — I4891 Unspecified atrial fibrillation: Secondary | ICD-10-CM | POA: Diagnosis not present

## 2023-05-17 DIAGNOSIS — M47812 Spondylosis without myelopathy or radiculopathy, cervical region: Secondary | ICD-10-CM | POA: Diagnosis not present

## 2023-05-17 DIAGNOSIS — W19XXXA Unspecified fall, initial encounter: Secondary | ICD-10-CM | POA: Diagnosis present

## 2023-05-17 DIAGNOSIS — G319 Degenerative disease of nervous system, unspecified: Secondary | ICD-10-CM | POA: Diagnosis not present

## 2023-05-17 DIAGNOSIS — D631 Anemia in chronic kidney disease: Secondary | ICD-10-CM | POA: Diagnosis not present

## 2023-05-17 DIAGNOSIS — R531 Weakness: Secondary | ICD-10-CM | POA: Diagnosis not present

## 2023-05-17 DIAGNOSIS — M5030 Other cervical disc degeneration, unspecified cervical region: Secondary | ICD-10-CM | POA: Diagnosis not present

## 2023-05-17 LAB — LIPID PANEL
Cholesterol: 166 mg/dL (ref 0–200)
HDL: 36 mg/dL — ABNORMAL LOW (ref >40)
LDL Cholesterol: 110 mg/dL — ABNORMAL HIGH (ref 0–99)
Total CHOL/HDL Ratio: 4.6 RATIO
Triglycerides: 100 mg/dL (ref <150)
VLDL: 20 mg/dL (ref 0–40)

## 2023-05-17 LAB — IRON AND TIBC
Iron: 46 ug/dL (ref 45–182)
Saturation Ratios: 33 % (ref 17.9–39.5)
TIBC: 141 ug/dL — ABNORMAL LOW (ref 250–450)
UIBC: 95 ug/dL

## 2023-05-17 LAB — COMPREHENSIVE METABOLIC PANEL
ALT: 8 U/L (ref 0–44)
AST: 13 U/L — ABNORMAL LOW (ref 15–41)
Albumin: 3 g/dL — ABNORMAL LOW (ref 3.5–5.0)
Alkaline Phosphatase: 74 U/L (ref 38–126)
Anion gap: 7 (ref 5–15)
BUN: 20 mg/dL (ref 8–23)
CO2: 26 mmol/L (ref 22–32)
Calcium: 8.4 mg/dL — ABNORMAL LOW (ref 8.9–10.3)
Chloride: 106 mmol/L (ref 98–111)
Creatinine, Ser: 1.63 mg/dL — ABNORMAL HIGH (ref 0.61–1.24)
GFR, Estimated: 40 mL/min — ABNORMAL LOW (ref >60)
Glucose, Bld: 87 mg/dL (ref 70–99)
Potassium: 3.5 mmol/L (ref 3.5–5.1)
Sodium: 139 mmol/L (ref 135–145)
Total Bilirubin: 0.9 mg/dL (ref 0.3–1.2)
Total Protein: 5.5 g/dL — ABNORMAL LOW (ref 6.5–8.1)

## 2023-05-17 LAB — CBC
HCT: 23.3 % — ABNORMAL LOW (ref 39.0–52.0)
HCT: 24.3 % — ABNORMAL LOW (ref 39.0–52.0)
Hemoglobin: 7.8 g/dL — ABNORMAL LOW (ref 13.0–17.0)
Hemoglobin: 8.2 g/dL — ABNORMAL LOW (ref 13.0–17.0)
MCH: 32.8 pg (ref 26.0–34.0)
MCH: 32.8 pg (ref 26.0–34.0)
MCHC: 33.5 g/dL (ref 30.0–36.0)
MCHC: 33.7 g/dL (ref 30.0–36.0)
MCV: 97.9 fL (ref 80.0–100.0)
MCV: 97.2 fL (ref 80.0–100.0)
Platelets: 301 10*3/uL (ref 150–400)
Platelets: 333 10*3/uL (ref 150–400)
RBC: 2.38 MIL/uL — ABNORMAL LOW (ref 4.22–5.81)
RBC: 2.5 MIL/uL — ABNORMAL LOW (ref 4.22–5.81)
RDW: 16 % — ABNORMAL HIGH (ref 11.5–15.5)
RDW: 15.7 % — ABNORMAL HIGH (ref 11.5–15.5)
WBC: 6.3 10*3/uL (ref 4.0–10.5)
WBC: 6 10*3/uL (ref 4.0–10.5)
nRBC: 0 % (ref 0.0–0.2)
nRBC: 0 % (ref 0.0–0.2)

## 2023-05-17 LAB — TYPE AND SCREEN
ABO/RH(D): A POS
Antibody Screen: NEGATIVE

## 2023-05-17 LAB — RETICULOCYTES
Immature Retic Fract: 10.8 % (ref 2.3–15.9)
RBC.: 2.37 MIL/uL — ABNORMAL LOW (ref 4.22–5.81)
Retic Count, Absolute: 37.7 10*3/uL (ref 19.0–186.0)
Retic Ct Pct: 1.6 % (ref 0.4–3.1)

## 2023-05-17 LAB — TROPONIN I (HIGH SENSITIVITY)
Troponin I (High Sensitivity): 51 ng/L — ABNORMAL HIGH (ref <18)
Troponin I (High Sensitivity): 35 ng/L — ABNORMAL HIGH (ref <18)
Troponin I (High Sensitivity): 35 ng/L — ABNORMAL HIGH (ref <18)

## 2023-05-17 LAB — APTT: aPTT: 33 seconds (ref 24–36)

## 2023-05-17 LAB — MAGNESIUM: Magnesium: 1.8 mg/dL (ref 1.7–2.4)

## 2023-05-17 LAB — URINALYSIS, ROUTINE W REFLEX MICROSCOPIC
Bacteria, UA: NONE SEEN
Bilirubin Urine: NEGATIVE
Glucose, UA: NEGATIVE mg/dL
Hgb urine dipstick: NEGATIVE
Ketones, ur: NEGATIVE mg/dL
Leukocytes,Ua: NEGATIVE
Nitrite: NEGATIVE
Protein, ur: 100 mg/dL — AB
Specific Gravity, Urine: 1.014 (ref 1.005–1.030)
pH: 5 (ref 5.0–8.0)

## 2023-05-17 LAB — CBC WITH DIFFERENTIAL/PLATELET
Abs Immature Granulocytes: 0.01 10*3/uL (ref 0.00–0.07)
Basophils Absolute: 0 10*3/uL (ref 0.0–0.1)
Basophils Relative: 0 %
Eosinophils Absolute: 0.2 10*3/uL (ref 0.0–0.5)
Eosinophils Relative: 3 %
HCT: 24.6 % — ABNORMAL LOW (ref 39.0–52.0)
Hemoglobin: 8.3 g/dL — ABNORMAL LOW (ref 13.0–17.0)
Immature Granulocytes: 0 %
Lymphocytes Relative: 55 %
Lymphs Abs: 3.5 10*3/uL (ref 0.7–4.0)
MCH: 33.2 pg (ref 26.0–34.0)
MCHC: 33.7 g/dL (ref 30.0–36.0)
MCV: 98.4 fL (ref 80.0–100.0)
Monocytes Absolute: 0.4 10*3/uL (ref 0.1–1.0)
Monocytes Relative: 7 %
Neutro Abs: 2.2 10*3/uL (ref 1.7–7.7)
Neutrophils Relative %: 35 %
Platelets: 333 10*3/uL (ref 150–400)
RBC: 2.5 MIL/uL — ABNORMAL LOW (ref 4.22–5.81)
RDW: 16 % — ABNORMAL HIGH (ref 11.5–15.5)
WBC: 6.3 10*3/uL (ref 4.0–10.5)
nRBC: 0 % (ref 0.0–0.2)

## 2023-05-17 LAB — PROTIME-INR
INR: 1.1 (ref 0.8–1.2)
Prothrombin Time: 14.5 seconds (ref 11.4–15.2)

## 2023-05-17 LAB — CK: Total CK: 66 U/L (ref 49–397)

## 2023-05-17 LAB — FOLATE: Folate: 5.4 ng/mL — ABNORMAL LOW (ref >5.9)

## 2023-05-17 LAB — VITAMIN B12: Vitamin B-12: 308 pg/mL (ref 180–914)

## 2023-05-17 LAB — FERRITIN: Ferritin: 470 ng/mL — ABNORMAL HIGH (ref 24–336)

## 2023-05-17 LAB — BRAIN NATRIURETIC PEPTIDE: B Natriuretic Peptide: 462.2 pg/mL — ABNORMAL HIGH (ref 0.0–100.0)

## 2023-05-17 MED ORDER — AMLODIPINE BESYLATE 10 MG PO TABS
10.0000 mg | ORAL_TABLET | Freq: Every day | ORAL | Status: DC
  Administered 2023-05-17 – 2023-05-22 (×6): 10 mg via ORAL
  Filled 2023-05-17: qty 1
  Filled 2023-05-17: qty 2
  Filled 2023-05-17 (×4): qty 1

## 2023-05-17 MED ORDER — HYDRALAZINE HCL 20 MG/ML IJ SOLN
10.0000 mg | INTRAMUSCULAR | Status: DC | PRN
  Administered 2023-05-17: 10 mg via INTRAVENOUS
  Filled 2023-05-17: qty 1

## 2023-05-17 MED ORDER — ONDANSETRON HCL 4 MG/2ML IJ SOLN: 4.0000 mg | Freq: Three times a day (TID) | INTRAMUSCULAR | Status: DC | PRN

## 2023-05-17 MED ORDER — FLUOXETINE HCL 20 MG PO CAPS
20.0000 mg | ORAL_CAPSULE | Freq: Every day | ORAL | Status: DC
  Administered 2023-05-17 – 2023-05-22 (×6): 20 mg via ORAL
  Filled 2023-05-17 (×6): qty 1

## 2023-05-17 MED ORDER — ADULT MULTIVITAMIN W/MINERALS CH
1.0000 | ORAL_TABLET | Freq: Every day | ORAL | Status: DC
  Administered 2023-05-17 – 2023-05-22 (×6): 1 via ORAL
  Filled 2023-05-17 (×6): qty 1

## 2023-05-17 MED ORDER — SENNOSIDES-DOCUSATE SODIUM 8.6-50 MG PO TABS: 1.0000 | ORAL_TABLET | Freq: Every evening | ORAL | Status: DC | PRN

## 2023-05-17 MED ORDER — LACTATED RINGERS IV BOLUS
1000.0000 mL | Freq: Once | INTRAVENOUS | Status: AC
  Administered 2023-05-17: 1000 mL via INTRAVENOUS

## 2023-05-17 MED ORDER — ACETAMINOPHEN 325 MG PO TABS
650.0000 mg | ORAL_TABLET | Freq: Four times a day (QID) | ORAL | Status: DC | PRN
  Administered 2023-05-17 – 2023-05-20 (×5): 650 mg via ORAL
  Filled 2023-05-17 (×5): qty 2

## 2023-05-17 MED ORDER — ATORVASTATIN CALCIUM 20 MG PO TABS
80.0000 mg | ORAL_TABLET | Freq: Every day | ORAL | Status: DC
  Administered 2023-05-17 – 2023-05-22 (×6): 80 mg via ORAL
  Filled 2023-05-17 (×6): qty 4

## 2023-05-17 MED ORDER — POLYETHYLENE GLYCOL 3350 17 G PO PACK: 17.0000 g | PACK | Freq: Every day | ORAL | Status: DC | PRN

## 2023-05-17 NOTE — ED Notes (Signed)
Transport requested

## 2023-05-17 NOTE — ED Notes (Signed)
Pt moved to CPOD bed at this time, blood noted on chux- skin tears noted on left and right elbows and on left AC. Skin tears dressed with Mepilex, left IV secured with new coban. Old skin tear noted on right posterior forearm. Sacrum is clean dry and skin is intact, brief is dry. Pt favors laying on left side, is alert and follows commands but does not answer questions and is nonverbal at this time. Supportive cushioning placed between knees, additional warm blanket given. Hospital socks applied, hospital gown remains- pt changed out of shirt. Call bell placed in pt's reach, pt in view of nurses station.

## 2023-05-17 NOTE — ED Notes (Signed)
No changes, VSS, to CT/ xray

## 2023-05-17 NOTE — ED Notes (Signed)
Pt asking where he is- reoriented pt to situation and plan of care.

## 2023-05-17 NOTE — ED Notes (Signed)
Pt alert, NAD, calm, interactive, cooperative, oriented to self, follows commands, MAEx4, skin tears noted to BUE. No active bleeding. Denies pain, nausea, sob or other sx. Appears generally weak and slow. EDP at [facility].

## 2023-05-17 NOTE — H&P (Addendum)
History and Physical    Charles Sims ZOX:[id number] DOB: Mar 08, 1930 DOA: 05/17/2023  Referring MD/NP/PA:   PCP: Joaquim Nam, MD   Patient coming from:  The patient is coming from home.     Chief Complaint: fall  HPI: Charles Sims is a 89 y.o. male with medical history significant of HTN, HLD, CAD, dCHF, stroke, depression, CKD-3B, BPH, carotid artery stenosis, atrial fibrillation not on anticoagulants, anemia, memory loss, who presents with fall.  Per his son at the bedside, patient was found on the floor this morning. He obviously fell last night, not sure what time patient fell.  Patient moves all extremities.  No facial droop or slurred speech.  He has generalized weakness, no unilateral numbness or tingling in extremities.  Patient did not complain of chest pain, cough, shortness of breath.  No nausea, vomiting, diarrhea or abdominal pain.  No symptoms of UTI, but has strong urinary odor.   Patient has memory loss.  Per his son, at normal baseline, patient recognizes family members, intermittently confused, usually not orientated to place and time.  Today his mental status is close to baseline per his son.  His hemoglobin has dropped from 11.0 on 02/12/2023 to 8.3.  Per his son, patient does not have dark stool or rectal bleeding.  Per ED physician, rectal exam is negative for blood.    Data reviewed independently and ED Course: pt was found to have WBC 6.3, trop 51, negative urinalysis, CK 66, BNP 462, stable renal function, temperature normal, blood pressure 2 2/79, 152/82, heart rate 49-59, RR 14, oxygen saturation 100% on room air.  CT head negative for acute intra cranial abnormalities.  CT of C-spine negative for acute injury, but showed degenerative disc disease.  Patient is placed on telemetry bed of observation.   EKG: I have personally reviewed.  A-fib, QTc 469, heart rate 59, early R wave progression   Review of Systems: Could not be accurately reviewed due to loss of  memory   Allergy:  Allergies  Allergen Reactions   Lisinopril Cough    Past Medical History:  Diagnosis Date   Adenoma of right adrenal gland    Arthritis    Benign prostatic hypertrophy with lower urinary tract symptoms (LUTS)    Bladder calculi    Bradycardia    CAD (coronary artery disease)    Carotid stenosis    Coronary artery disease    Dysrhythmia    bradycardia   Elevated PSA    GERD (gastroesophageal reflux disease)    Gross hematuria    Hyperlipidemia    Hypertension    Memory loss    Prostatitis    Shortness of breath dyspnea    Urinary frequency     Past Surgical History:  Procedure Laterality Date   CAROTID ENDARTERECTOMY Left    CYSTOSCOPY WITH LITHOLAPAXY N/A 04/13/2015   Procedure: CYSTOSCOPY WITH LITHOLAPAXY;  Surgeon: Vanna Scotland, MD;  Location: [facility] ORS;  Service: Urology;  Laterality: N/A;    Social History:  reports that he quit smoking about 18 years ago. His smoking use included cigarettes. He has never used smokeless tobacco. He reports that he does not drink alcohol and does not use drugs.  Family History:  Family History  Problem Relation Age of Onset   Alzheimer's disease Mother    Kidney cancer Father    Kidney cancer Paternal Grandfather      Prior to Admission medications   Medication Sig Start Date End Date Taking? Authorizing  Provider  amLODipine (NORVASC) 10 MG tablet Take 1 tablet (10 mg total) by mouth daily. 02/12/23   Joaquim Nam, MD  aspirin EC 81 MG tablet Take 1 tablet (81 mg total) by mouth daily. Swallow whole. 01/29/23   Kathlen Mody, MD  atorvastatin (LIPITOR) 80 MG tablet Take 1 tablet (80 mg total) by mouth daily. 01/29/23   Kathlen Mody, MD  FLUoxetine (PROZAC) 20 MG capsule Take 1 capsule (20 mg total) by mouth daily. 01/29/23   Kathlen Mody, MD  Multiple Vitamin (MULTIVITAMIN WITH MINERALS) TABS tablet Take 1 tablet by mouth daily. 01/29/23   Kathlen Mody, MD  polyethylene glycol (MIRALAX / GLYCOLAX) 17 g  packet Take 17 g by mouth daily as needed for moderate constipation. 01/28/23   Kathlen Mody, MD  senna-docusate (SENOKOT-S) 8.6-50 MG tablet Take 1 tablet by mouth at bedtime as needed for mild constipation. 01/28/23   Kathlen Mody, MD    Physical Exam: Vitals:   05/17/23 0825 05/17/23 0900 05/17/23 0930 05/17/23 1000  BP: (!) 202/79 (!) 152/82 (!) 154/67 (!) 179/82  Pulse: (!) 49 (!) 51 (!) 51 (!) 106  Resp: 14 11 14 13   Temp:      TempSrc:      SpO2: 100% 100% 100% 100%  Weight:      Height:       General: Not in acute distress. Pale looking HEENT:       Eyes: PERRL, EOMI, no jaundice       ENT: No discharge from the ears and nose.       Neck: No JVD, no bruit, no mass felt. Heme: No neck lymph node enlargement. Cardiac: S1/S2, RRR, No murmurs, No gallops or rubs. Respiratory: No rales, wheezing, rhonchi or rubs. GI: Soft, nondistended, nontender, no organomegaly, BS present. GU: No hematuria Ext: No pitting leg edema bilaterally. 1+DP/PT pulse bilaterally. Musculoskeletal: No joint deformities, No joint redness or warmth, no limitation of ROM in spin. Skin: No rashes.  Neuro: Confused, recognizes his son, not oriented to place and time. Cranial nerves II-XII grossly intact, moves all extremities. Has generalized weakness, no focal Psych: Patient is not psychotic, no suicidal or hemocidal ideation.  Labs on Admission: I have personally reviewed following labs and imaging studies  CBC: Recent Labs  Lab 05/17/23 0652  WBC 6.3  NEUTROABS 2.2  HGB 8.3*  HCT 24.6*  MCV 98.4  PLT 333   Basic Metabolic Panel: Recent Labs  Lab 05/17/23 0652 05/17/23 0700  NA 139  --   K 3.5  --   CL 106  --   CO2 26  --   GLUCOSE 87  --   BUN 20  --   CREATININE 1.63*  --   CALCIUM 8.4*  --   MG  --  1.8   GFR: Estimated Creatinine Clearance: 28.4 mL/min (A) (by C-G formula based on SCr of 1.63 mg/dL (H)). Liver Function Tests: Recent Labs  Lab 05/17/23 0652  AST 13*  ALT  8  ALKPHOS 74  BILITOT 0.9  PROT 5.5*  ALBUMIN 3.0*   No results for input(s): "LIPASE", "AMYLASE" in the last 168 hours. No results for input(s): "AMMONIA" in the last 168 hours. Coagulation Profile: Recent Labs  Lab 05/17/23 0652  INR 1.1   Cardiac Enzymes: Recent Labs  Lab 05/17/23 0700  CKTOTAL 66   BNP (last 3 results) No results for input(s): "PROBNP" in the last 8760 hours. HbA1C: No results for input(s): "HGBA1C" in the last  72 hours. CBG: No results for input(s): "GLUCAP" in the last 168 hours. Lipid Profile: Recent Labs    05/17/23 0652  CHOL 166  HDL 36*  LDLCALC 110*  TRIG 100  CHOLHDL 4.6   Thyroid Function Tests: No results for input(s): "TSH", "T4TOTAL", "FREET4", "T3FREE", "THYROIDAB" in the last 72 hours. Anemia Panel: No results for input(s): "VITAMINB12", "FOLATE", "FERRITIN", "TIBC", "IRON", "RETICCTPCT" in the last 72 hours. Urine analysis:    Component Value Date/Time   COLORURINE YELLOW (A) 05/17/2023 0652   APPEARANCEUR HAZY (A) 05/17/2023 0652   APPEARANCEUR Clear 05/27/2015 1351   LABSPEC 1.014 05/17/2023 0652   PHURINE 5.0 05/17/2023 0652   GLUCOSEU NEGATIVE 05/17/2023 0652   HGBUR NEGATIVE 05/17/2023 0652   BILIRUBINUR NEGATIVE 05/17/2023 0652   BILIRUBINUR Negative 05/27/2015 1351   KETONESUR NEGATIVE 05/17/2023 0652   PROTEINUR 100 (A) 05/17/2023 0652   NITRITE NEGATIVE 05/17/2023 0652   LEUKOCYTESUR NEGATIVE 05/17/2023 0652   Sepsis Labs: @LABRCNTIP (procalcitonin:4,lacticidven:4) )No results found for this or any previous visit (from the past 240 hour(s)).   Radiological Exams on Admission: DG Chest 2 View  Result Date: 05/17/2023 CLINICAL DATA:  89 year old male with history of weakness. EXAM: CHEST - 2 VIEW COMPARISON:  Chest x-ray 01/24/2023. FINDINGS: Skin fold artifact projecting over the lateral aspect of the right hemithorax. Lung volumes are low. No consolidative airspace disease. No pleural effusions. No  pneumothorax. No pulmonary nodule or mass noted. Pulmonary vasculature and the cardiomediastinal silhouette are within normal limits. Atherosclerotic calcifications are noted in the thoracic aorta. IMPRESSION: 1. Low lung volumes without radiographic evidence of acute cardiopulmonary disease. 2. Aortic atherosclerosis. Electronically Signed   By: Trudie Reed M.D.   On: 05/17/2023 07:55   CT Cervical Spine Wo Contrast  Result Date: 05/17/2023 CLINICAL DATA:  89 year old male found down this morning. Weakness and strong urine odor. EXAM: CT CERVICAL SPINE WITHOUT CONTRAST TECHNIQUE: Multidetector CT imaging of the cervical spine was performed without intravenous contrast. Multiplanar CT image reconstructions were also generated. RADIATION DOSE REDUCTION: This exam was performed according to the departmental dose-optimization program which includes automated exposure control, adjustment of the mA and/or kV according to patient size and/or use of iterative reconstruction technique. COMPARISON:  Cervical spine CT 05/08/2022. FINDINGS: Alignment: Stable since last year. Straightening of upper cervical lordosis. Bilateral posterior element alignment is within normal limits. Cervicothoracic junction alignment is within normal limits. Skull base and vertebrae: Bone mineralization is within normal limits for age. Visualized skull base is intact. No atlanto-occipital dissociation. C1 and C2 appear intact and aligned. No acute osseous abnormality identified. Soft tissues and spinal canal: No prevertebral fluid or swelling. No visible canal hematoma. Chronic left carotid space surgical clips. Chronic right suboccipital sebaceous cyst. Disc levels: Chronic cervical spine degeneration, including facet arthropathy. Degenerative left C2-C3 facet ankylosis is newly developing since last year. Multifactorial spinal stenosis appears stable, probably maximal at C5-C6. Upper chest: Visible upper thoracic levels appears stable  and intact. Negative lung apices. IMPRESSION: 1. No acute traumatic injury identified in the cervical spine. 2. Chronic cervical spine degeneration with chronic spinal stenosis and developing left C2-C3 facet ankylosis. Electronically Signed   By: Odessa Fleming M.D.   On: 05/17/2023 07:48   CT Head Wo Contrast  Result Date: 05/17/2023 CLINICAL DATA:  89 year old male found down this morning. Weakness and strong urine odor. EXAM: CT HEAD WITHOUT CONTRAST TECHNIQUE: Contiguous axial images were obtained from the base of the skull through the vertex without intravenous contrast. RADIATION DOSE  REDUCTION: This exam was performed according to the departmental dose-optimization program which includes automated exposure control, adjustment of the mA and/or kV according to patient size and/or use of iterative reconstruction technique. COMPARISON:  Brain MRI 01/24/2023. Head CT 01/25/2023. FINDINGS: Brain: Stable cerebral volume. No midline shift, ventriculomegaly, mass effect, evidence of mass lesion, intracranial hemorrhage or evidence of cortically based acute infarction. Confluent bilateral cerebral white matter hypodensity with a small area of left superior perirolandic cortical encephalomalacia is stable. Vascular: Calcified atherosclerosis at the skull base. No suspicious intracranial vascular hyperdensity. Skull: No acute osseous abnormality identified.  TMJ degeneration. Sinuses/Orbits: Visualized paranasal sinuses and mastoids are stable and well aerated. Other: No acute orbit or scalp soft tissue injury is identified. Chronic right suboccipital sebaceous cyst in some scalp vessel calcified atherosclerosis. IMPRESSION: 1. No acute intracranial abnormality or acute traumatic injury identified. 2.  Stable non contrast CT appearance of chronic ischemic disease. Electronically Signed   By: Odessa Fleming M.D.   On: 05/17/2023 07:45      Assessment/Plan Principal Problem:   Fall Active Problems:   Essential  hypertension   Hypertensive urgency   CAD S/P percutaneous coronary angioplasty   Myocardial injury   Stroke [facility])   Atrial fibrillation, chronic (HCC)   HLD (hyperlipidemia)   Chronic diastolic CHF (congestive heart failure) (HCC)   Chronic kidney disease, stage 3b (HCC)   Normocytic anemia   Depression   Assessment and Plan:  Fall: Patient has generalized weakness.  No focal neurodeficit on physical examination.  CK level 66.  -Placed on head of bed for position -PT/OT -Fall precaution -Frequent neurocheck -MRI for brain is ordered by EDP, will follow-up MRI for brain to rule out stroke -->negative for new stroke  Essential hypertension and hypertensive urgency: Bp 202/29 --> 152/79 -IV hydralazine as needed -Amlodipine  CAD S/P percutaneous coronary angioplasty and myocardial injury: trop  51, no CP -Hold aspirin due to worsening anemia, concerning for chronic GI blood loss -Lipitor  Stroke (HCC) -Lipitor -Hold aspirin  Atrial fibrillation, chronic (HCC): Heart rate 49-59.  Not on anticoagulants possibly due to chronic anemia due to GI blood loss -Telemetry monitoring  HLD (hyperlipidemia) -Lipitor  Chronic diastolic CHF (congestive heart failure) (HCC): 2D echo on 01/25/2023 showed EF of 50-55%.  BNP is elevated at 462, but no shortness of breath, no leg edema or JVD.  CHF seem to be compensated. -Watch volume status closely  Chronic kidney disease, stage 3b (HCC): Stable.  Recent baseline creatinine 1.92 on 02/12/2023.  His creatinine is 1.63, BUN 20, GFR 40 today. -Follow-up with BMP  Normocytic anemia: Hemoglobin dropped from 11.0 on 02/12/2023 to 8.3 today.  No active bleeding.  No dark stool or rectal bleeding.  FOBT is negative per ED physician. Of note, pt has abnormal differential, with 35% of neutrophil and 55% of lymphocyte. -Check anemia panel -Follow-up CBC q8h -Goal for transfusion is hemoglobin less than 7 -May need to follow-up with hematologist as  outpatient workup  Depression -Prozac       DVT ppx: SCD  Code Status: DNR per  his son  Family Communication:   Yes, patient's son at bed side.   Disposition Plan:  Anticipate discharge back to previous environment  Consults called:  none   Admission status and Level of care: Telemetry Medical:    for obs    Dispo: The patient is from: Home                 Anticipated  d/c is to: Home              Anticipated d/c date is: 1 day              Patient currently is not medically stable to d/c.    Severity of Illness:  The appropriate patient status for this patient is OBSERVATION. Observation status is judged to be reasonable and necessary in order to provide the required intensity of service to ensure the patient's safety. The patient's presenting symptoms, physical exam findings, and initial radiographic and laboratory data in the context of their medical condition is felt to place them at decreased risk for further clinical deterioration. Furthermore, it is anticipated that the patient will be medically stable for discharge from the hospital within 2 midnights of admission.        Date of Service 05/17/2023    Lorretta Harp [facility]   If 7PM-7AM, please contact night-coverage [email or web address] 05/17/2023, 10:27 AM

## 2023-05-17 NOTE — Progress Notes (Signed)
PT Cancellation Note  Patient Details Name: Charles Sims MRN: [id number] DOB: 14-Aug-1930   Cancelled Treatment:    Reason Eval/Treat Not Completed: Patient not medically ready. Patient has MRI pending to r/o CVA. Will await MRI results prior to evaluation.    Dariana Garbett 05/17/2023, 12:48 PM

## 2023-05-17 NOTE — ED Notes (Signed)
EDP at [facility] for rectal exam, chaperone RN, and son present. Tolerated well.

## 2023-05-17 NOTE — ED Notes (Signed)
Admitting MD at BS.  

## 2023-05-17 NOTE — ED Notes (Signed)
Back from Radiology.

## 2023-05-17 NOTE — Progress Notes (Signed)
OT Cancellation Note  Patient Details Name: Charles Sims MRN: [id number] DOB: Jul 28, 1930   Cancelled Treatment:    Reason Eval/Treat Not Completed: Other (comment). Pt pending further work up for admission, including MRI. Will hold OT evaluation at this time and re-attempt once completed.   Arman Filter., MPH, MS, OTR/L ascom [phone] 05/17/23, 1:11 PM

## 2023-05-17 NOTE — ED Notes (Signed)
Pt in MRI at this time 

## 2023-05-17 NOTE — ED Provider Notes (Signed)
[facility] Provider Note    Event Date/Time   First MD Initiated Contact with Patient 05/17/23 [phone]     (approximate)   History   Chief Complaint Fall and Weakness   HPI  Charles Sims is a 89 y.o. male with past medical history of hypertension, CAD, stroke, and atrial fibrillation who presents to the ED with fall and weakness.  Per EMS, patient was found down on the ground this morning by family, who stated that he had been increasingly weak over the past couple of days.  Patient states that he does not remember falling and is not sure why he is here.  He currently denies any areas of pain, denies any cough, difficulty breathing, or dysuria.  Family reported a strong odor to patient's urine recently.     Physical Exam   Triage Vital Signs: ED Triage Vitals  Encounter Vitals Group     BP 05/17/23 0644 (!) 230/88     Systolic BP Percentile --      Diastolic BP Percentile --      Pulse Rate 05/17/23 0644 (!) 58     Resp 05/17/23 0644 10     Temp 05/17/23 0644 97.6 F (36.4 C)     Temp Source 05/17/23 0644 Axillary     SpO2 05/17/23 0644 100 %     Weight 05/17/23 0648 150 lb (68 kg)     Height 05/17/23 0648 5\' 10"  (1.778 m)     Head Circumference --      Peak Flow --      Pain Score --      Pain Loc --      Pain Education --      Exclude from Growth Chart --     Most recent vital signs: Vitals:   05/17/23 0720 05/17/23 0725  BP:    Pulse: (!) 53 (!) 53  Resp: 12 10  Temp:    SpO2: 100% 100%    Constitutional: Alert and oriented to person, but not place, time, or situation. Eyes: Conjunctivae are normal.  Pupils equal, round, reactive to light bilaterally. Head: Atraumatic. Nose: No congestion/rhinnorhea. Mouth/Throat: Mucous membranes are dry. Neck: No midline cervical spine tenderness to palpation. Cardiovascular: Bradycardic, regular rhythm. Grossly normal heart sounds.  2+ radial pulses bilaterally. Respiratory: Normal  respiratory effort.  No retractions. Lungs CTAB.  No chest wall tenderness to palpation. Gastrointestinal: Soft and nontender. No distention. Musculoskeletal: No lower extremity tenderness nor edema.  Skin tears to bilateral elbows, no upper extremity bony tenderness to palpation.  Range of motion intact to all 4 extremities. Neurologic:  Normal speech and language.  Global weakness noted with no gross focal neurologic deficits appreciated.    ED Results / Procedures / Treatments   Labs (all labs ordered are listed, but only abnormal results are displayed) Labs Reviewed  CBC WITH DIFFERENTIAL/PLATELET - Abnormal; Notable for the following components:      Result Value   RBC 2.50 (*)    Hemoglobin 8.3 (*)    HCT 24.6 (*)    RDW 16.0 (*)    All other components within normal limits  COMPREHENSIVE METABOLIC PANEL - Abnormal; Notable for the following components:   Creatinine, Ser 1.63 (*)    Calcium 8.4 (*)    Total Protein 5.5 (*)    Albumin 3.0 (*)    AST 13 (*)    GFR, Estimated 40 (*)    All other components within normal limits  URINALYSIS, ROUTINE W REFLEX MICROSCOPIC - Abnormal; Notable for the following components:   Color, Urine YELLOW (*)    APPearance HAZY (*)    Protein, ur 100 (*)    All other components within normal limits  TROPONIN I (HIGH SENSITIVITY) - Abnormal; Notable for the following components:   Troponin I (High Sensitivity) 51 (*)    All other components within normal limits  CK  MAGNESIUM     EKG  ED ECG REPORT I, Chesley Noon, the attending physician, personally viewed and interpreted this ECG.   Date: 05/17/2023  EKG Time: 6:43  Rate: 58  Rhythm: sinus bradycardia  Axis: Normal  Intervals:none  ST&T Change: None  RADIOLOGY CT head reviewed and interpreted by me with no hemorrhage or midline shift.  PROCEDURES:  Critical Care performed: No  Procedures   MEDICATIONS ORDERED IN ED: Medications  lactated ringers bolus 1,000 mL (0  mLs Intravenous Stopped 05/17/23 0834)     IMPRESSION / MDM / ASSESSMENT AND PLAN / ED COURSE  I reviewed the triage vital signs and the nursing notes.                              89 y.o. male with past medical history of hypertension, CAD, stroke, and atrial fibrillation who presents to the ED complaining of generalized weakness over the past couple of days with fall and unknown downtime last night.  Patient's presentation is most consistent with acute presentation with potential threat to life or bodily function.  Differential diagnosis includes, but is not limited to, intracranial injury, cervical spine injury, extremity injury, pneumonia, UTI, electrode abnormality, AKI, rhabdomyolysis, stroke.  Patient chronically ill-appearing but in no acute distress, vital signs remarkable for bradycardia and elevated blood pressure.  He is alert but oriented only to person, appears globally weak but with no focal neurologic deficits.  We will check CT head and cervical spine for evidence of traumatic injury, no evidence of traumatic injury to trunk on exam and while he has skin tears to bilateral elbows, no evidence of bony injury.  He appears dehydrated, labs are pending at this time and we will hydrate with IV fluids.  Also plan to check for infectious process with chest x-ray and urinalysis.  CT head and cervical spine are negative for acute process, chest x-ray also unremarkable.  Labs are reassuring with stable chronic kidney disease, no acute electrolyte abnormality or LFT abnormality noted.  Patient does have drop of hemoglobin compared to previous, son at bedside reports no recent bleeding and rectal exam is negative for occult blood.  No significant leukocytosis noted and urinalysis shows no signs of infection.  Given change in mental status and acute weakness, case discussed with hospitalist for admission.  He had a similar presentation in March and was found to have stroke at that time, MRI brain  was ordered.      FINAL CLINICAL IMPRESSION(S) / ED DIAGNOSES   Final diagnoses:  Altered mental status, unspecified altered mental status type  Generalized weakness     Rx / DC Orders   ED Discharge Orders          Ordered    MR BRAIN WO CONTRAST  Status:  Canceled        05/17/23 1610             Note:  This document was prepared using Dragon voice recognition software and may include unintentional dictation errors.  Chesley Noon, MD 05/17/23 [phone]

## 2023-05-17 NOTE — ED Triage Notes (Signed)
Patient BIB EMS after a fall.  Patient does not remember how long ago he fell, but family found him down on the floor this morning.  Family reports increased weakness as well as a strong urine odor over the past few days.

## 2023-05-18 DIAGNOSIS — I10 Essential (primary) hypertension: Secondary | ICD-10-CM | POA: Diagnosis not present

## 2023-05-18 DIAGNOSIS — W19XXXA Unspecified fall, initial encounter: Secondary | ICD-10-CM | POA: Diagnosis not present

## 2023-05-18 DIAGNOSIS — I482 Chronic atrial fibrillation, unspecified: Secondary | ICD-10-CM | POA: Diagnosis not present

## 2023-05-18 DIAGNOSIS — D649 Anemia, unspecified: Secondary | ICD-10-CM | POA: Diagnosis not present

## 2023-05-18 DIAGNOSIS — F0390 Unspecified dementia without behavioral disturbance: Secondary | ICD-10-CM

## 2023-05-18 LAB — BASIC METABOLIC PANEL
Anion gap: 7 (ref 5–15)
BUN: 25 mg/dL — ABNORMAL HIGH (ref 8–23)
CO2: 26 mmol/L (ref 22–32)
Calcium: 8.1 mg/dL — ABNORMAL LOW (ref 8.9–10.3)
Chloride: 105 mmol/L (ref 98–111)
Creatinine, Ser: 1.4 mg/dL — ABNORMAL HIGH (ref 0.61–1.24)
GFR, Estimated: 47 mL/min — ABNORMAL LOW (ref >60)
Glucose, Bld: 86 mg/dL (ref 70–99)
Potassium: 3.8 mmol/L (ref 3.5–5.1)
Sodium: 138 mmol/L (ref 135–145)

## 2023-05-18 LAB — CBC
HCT: 21.3 % — ABNORMAL LOW (ref 39.0–52.0)
Hemoglobin: 7.3 g/dL — ABNORMAL LOW (ref 13.0–17.0)
MCH: 33.5 pg (ref 26.0–34.0)
MCHC: 34.3 g/dL (ref 30.0–36.0)
MCV: 97.7 fL (ref 80.0–100.0)
Platelets: 312 10*3/uL (ref 150–400)
RBC: 2.18 MIL/uL — ABNORMAL LOW (ref 4.22–5.81)
RDW: 15.7 % — ABNORMAL HIGH (ref 11.5–15.5)
WBC: 5.7 10*3/uL (ref 4.0–10.5)
nRBC: 0 % (ref 0.0–0.2)

## 2023-05-18 MED ORDER — LORAZEPAM 2 MG/ML IJ SOLN: 1.0000 mg | Freq: Four times a day (QID) | INTRAMUSCULAR | Status: DC | PRN

## 2023-05-18 MED ORDER — HALOPERIDOL LACTATE 5 MG/ML IJ SOLN
1.0000 mg | Freq: Four times a day (QID) | INTRAMUSCULAR | Status: DC | PRN
  Administered 2023-05-18: 2 mg via INTRAVENOUS
  Administered 2023-05-18: 1 mg via INTRAVENOUS
  Administered 2023-05-19: 2 mg via INTRAVENOUS
  Filled 2023-05-18 (×3): qty 1

## 2023-05-18 MED ORDER — LORAZEPAM 2 MG/ML IJ SOLN
1.0000 mg | Freq: Three times a day (TID) | INTRAMUSCULAR | Status: DC | PRN
  Administered 2023-05-18 (×2): 1 mg via INTRAVENOUS
  Filled 2023-05-18 (×3): qty 1

## 2023-05-18 NOTE — Evaluation (Addendum)
Physical Therapy Evaluation Patient Details Name: Charles Sims MRN: [id number] DOB: 08-Nov-1929 Today's Date: 05/18/2023  History of Present Illness  Pt is a 89 year old male presenting with a fall; PMH significant for HTN, HLD, CAD, dCHF, stroke, depression, CKD-3B, BPH, carotid artery stenosis, atrial fibrillation not on anticoagulants, anemia, memory loss  Clinical Impression  Pt presents side lying in bed and apprehensive about participating in PT. PT able to encourage pt to participate after repeated attempts and education about need to verify pt's safety at home. Pt continues to show impaired cognition that does not seem to be significantly different than baseline. Pt is poor historian and PT unable to confirm pt's level of assistance at home and has to rely on chart. Nursing to confirm level of assistance with family, who reports concern about their ability to ensure pt safety and how this concern has been ongoing for some time prior to admission to hospital. Pt will benefit from ongoing skilled PT to address decreased mobility and LE strength and to decrease risk of falling to prevent further injury and hospital admissions. Pt still shows ability to follow 1 step instructions albeit with moderate verbal and tactile cues and the allowance of increase time to sequence and execute task.       Assistance Recommended at Discharge Frequent or constant Supervision/Assistance  If plan is discharge home, recommend the following:  Can travel by private vehicle  A lot of help with bathing/dressing/bathroom;Assistance with cooking/housework;Assistance with feeding;Assist for transportation;A little help with walking and/or transfers   No    Equipment Recommendations    Recommendations for Other Services       Functional Status Assessment Patient has had a recent decline in their functional status and demonstrates the ability to make significant improvements in function in a reasonable and  predictable amount of time.     Precautions / Restrictions Precautions Precautions: Fall Restrictions Weight Bearing Restrictions: No      Mobility  Bed Mobility Overal bed mobility: Modified Independent Bed Mobility: Sidelying to Sit   Sidelying to sit: Modified independent (Device/Increase time)   Sit to supine: Modified independent (Device/Increase time)        Transfers Overall transfer level: Needs assistance Equipment used: Rolling walker (2 wheels) Transfers: Sit to/from Stand Sit to Stand: Min assist                Ambulation/Gait Ambulation/Gait assistance: Supervision Gait Distance (Feet): 10 Feet Assistive device: Rolling walker (2 wheels) Gait Pattern/deviations: Decreased step length - right, Decreased step length - left, Knee flexed in stance - left, Knee flexed in stance - right Gait velocity: Slow     General Gait Details: Decreased step length right and left and increased reliance on BUE support with forward flexed trunk posture  Stairs            Wheelchair Mobility     Tilt Bed    Modified Rankin (Stroke Patients Only)       Balance Overall balance assessment: Modified Independent Sitting-balance support: No upper extremity supported Sitting balance-Leahy Scale: Good     Standing balance support: Bilateral upper extremity supported Standing balance-Leahy Scale: Fair Standing balance comment: Use of 2WW to maintain BUE support                             Pertinent Vitals/Pain Pain Assessment Pain Assessment: Faces Faces Pain Scale: Hurts whole lot Pain Location: Lumbar spine over central  spinous process Pain Intervention(s): Monitored during session    Home Living Family/patient expects to be discharged to:: Private residence Living Arrangements: Spouse/significant other Available Help at Discharge: Family;Available 24 hours/day (Per Chart) Type of Home: House Home Access: Ramped entrance      Alternate Level Stairs-Number of Steps: 13 Home Layout: Two level (Able to live on main floor) Home Equipment: Rolling Walker (2 wheels) Additional Comments: home set up from previous admission, pt is a poor historian,reports no use of RW, will need to confirm    Prior Function Prior Level of Function : Needs assist (History of falls)             Mobility Comments: Use of RW per chart but pt unable to confirm how he is moving at home due to cognitive decline ADLs Comments: Per chart pt did have 24/7 supervision, pt is a poor report     Hand Dominance        Extremity/Trunk Assessment   Upper Extremity Assessment Upper Extremity Assessment: Defer to OT evaluation    Lower Extremity Assessment Lower Extremity Assessment: Generalized weakness    Cervical / Trunk Assessment Cervical / Trunk Assessment: Kyphotic  Communication   Communication: Other (comment) (Pt's provides vague responses to questions and he also was experiencing auditory hallucination during session)  Cognition Arousal/Alertness: Lethargic Behavior During Therapy: Flat affect Overall Cognitive Status: History of cognitive impairments - at baseline Area of Impairment: Orientation, Memory, Attention                 Orientation Level: Person Current Attention Level: Selective   Following Commands: Follows one step commands with increased time Safety/Judgement: Decreased awareness of safety   Problem Solving: Requires verbal cues, Requires tactile cues General Comments: Pt demonstrates difficulty with executing motor tasks and requires moderate VC and TC to initiate motor task        General Comments General comments (skin integrity, edema, etc.): vss throughout    Exercises     Assessment/Plan    PT Assessment Patient needs continued PT services  PT Problem List Decreased cognition;Decreased knowledge of use of DME;Decreased safety awareness;Decreased mobility;Decreased strength;Decreased  knowledge of precautions;Decreased balance;Pain       PT Treatment Interventions DME instruction;Gait training;Therapeutic activities;Therapeutic exercise;Balance training;Patient/family education    PT Goals (Current goals can be found in the Care Plan section)  Acute Rehab PT Goals Patient Stated Goal: To return home Time For Goal Achievement: 06/01/23 Potential to Achieve Goals: Fair    Frequency Min 2X/week     Co-evaluation               AM-PAC PT "6 Clicks" Mobility  Outcome Measure Help needed turning from your back to your side while in a flat bed without using bedrails?: None Help needed moving from lying on your back to sitting on the side of a flat bed without using bedrails?: A Little Help needed moving to and from a bed to a chair (including a wheelchair)?: A Lot Help needed standing up from a chair using your arms (e.g., wheelchair or bedside chair)?: A Lot Help needed to walk in hospital room?: A Lot Help needed climbing 3-5 steps with a railing? : A Lot 6 Click Score: 15    End of Session Equipment Utilized During Treatment: Gait belt Activity Tolerance: Other (comment) (Treatment limited by pt's motivation and decreased cognition)   Nurse Communication: Mobility status PT Visit Diagnosis: Unsteadiness on feet (R26.81);Repeated falls (R29.6);Muscle weakness (generalized) (M62.81)  Time: 1610-9604 PT Time Calculation (min) (ACUTE ONLY): 30 min   Charges:   PT Evaluation $PT Eval Moderate Complexity: 1 Mod PT Treatments $Therapeutic Activity: 23-37 mins PT General Charges $$ ACUTE PT VISIT: 1 Visit       Ellin Goodie PT, DPT  05/18/2023, 11:25 AM

## 2023-05-18 NOTE — Evaluation (Signed)
Occupational Therapy Evaluation Patient Details Name: Charles Sims MRN: [id number] DOB: [birth year] Today's Date: 05/18/2023   History of Present Illness Pt is a 89 year old male presenting with a fall; PMH significant for HTN, HLD, CAD, dCHF, stroke, depression, CKD-3B, BPH, carotid artery stenosis, atrial fibrillation not on anticoagulants, anemia, memory loss   Clinical Impression   Chart reviewed, pt greeted in bed, initially resistant to OT evaluation however agreeable with encouragement. Pt is oriented to self only, unaware he is in the hospital, does state "I feel weak". Per chart pt has baseline cognitive deficits. No family in room to confirm baseline status- per chart pt has supervision at home, assist for ADL/IADL, will need to confirm. Pt presents with deficits in strength, endurance, activity tolerance, balance, cognition affecting safe and optimal ADL completion. Step by step vcs required throughout for task participation. Pt will benefit from acute OT to address functional deficits.      Recommendations for follow up therapy are one component of a multi-disciplinary discharge planning process, led by the attending physician.  Recommendations may be updated based on patient status, additional functional criteria and insurance authorization.   Assistance Recommended at Discharge Frequent or constant Supervision/Assistance  Patient can return home with the following A little help with walking and/or transfers;A little help with bathing/dressing/bathroom;Direct supervision/assist for medications management;Assist for transportation;Direct supervision/assist for financial management;Help with stairs or ramp for entrance    Functional Status Assessment  Patient has had a recent decline in their functional status and demonstrates the ability to make significant improvements in function in a reasonable and predictable amount of time.  Equipment Recommendations  Wheelchair (measurements  OT)    Recommendations for Other Services       Precautions / Restrictions Precautions Precautions: Fall Restrictions Weight Bearing Restrictions: No      Mobility Bed Mobility Overal bed mobility: Needs Assistance Bed Mobility: Supine to Sit, Sit to Supine     Supine to sit: Min assist, HOB elevated Sit to supine: Supervision, HOB elevated        Transfers Overall transfer level: Needs assistance Equipment used: Rolling walker (2 wheels), None Transfers: Sit to/from Stand Sit to Stand:  (MOD A without RW, MIN A with RW, step by step vcs throughout)                  Balance Overall balance assessment: Needs assistance Sitting-balance support: Feet supported Sitting balance-Leahy Scale: Fair     Standing balance support: Bilateral upper extremity supported Standing balance-Leahy Scale: Poor                             ADL either performed or assessed with clinical judgement   ADL Overall ADL's : Needs assistance/impaired Eating/Feeding: Supervision/ safety   Grooming: Wash/dry hands;Supervision/safety               Lower Body Dressing: Maximal assistance   Toilet Transfer: Min guard;Minimal assistance;Ambulation;Rolling walker (2 wheels) Toilet Transfer Details (indicate cue type and reason): simulated with step by step vcs Toileting- Clothing Manipulation and Hygiene: Maximal assistance       Functional mobility during ADLs: Min guard;Minimal assistance;Rolling walker (2 wheels) (approx 15' in room, pt requires step by step vcs for posture)       Vision Patient Visual Report: No change from baseline Additional Comments: will continue to assess     Perception     Praxis      Pertinent  Vitals/Pain Pain Assessment Pain Assessment: PAINAD Breathing: normal Negative Vocalization: occasional moan/groan, low speech, negative/disapproving quality Facial Expression: sad, frightened, frown Body Language: tense, distressed pacing,  fidgeting Consolability: distracted or reassured by voice/touch PAINAD Score: 4 Pain Location: generalized, stomach Pain Descriptors / Indicators: Discomfort Pain Intervention(s): Monitored during session     Hand Dominance     Extremity/Trunk Assessment Upper Extremity Assessment Upper Extremity Assessment: Generalized weakness   Lower Extremity Assessment Lower Extremity Assessment: Generalized weakness       Communication     Cognition Arousal/Alertness: Lethargic, Awake/alert (lethargic intially, improved participation as session progressed) Behavior During Therapy: Flat affect Overall Cognitive Status: No family/caregiver present to determine baseline cognitive functioning Area of Impairment: Orientation, Attention, Memory, Following commands, Safety/judgement, Awareness, Problem solving                 Orientation Level: Disoriented to, Time, Situation, Place (does state "I am weak") Current Attention Level: Focused Memory: Decreased short-term memory Following Commands: Follows one step commands with increased time Safety/Judgement: Decreased awareness of safety, Decreased awareness of deficits Awareness: Intellectual Problem Solving: Slow processing, Difficulty sequencing, Requires verbal cues, Requires tactile cues General Comments: baseline dementia     General Comments  vss throughout    Exercises     Shoulder Instructions      Home Living Family/patient expects to be discharged to:: Private residence Living Arrangements: Spouse/significant other Available Help at Discharge: Family;Available 24 hours/day (per chart) Type of Home: House Home Access: Ramped entrance     Home Layout: Two level;Able to live on main level with bedroom/bathroom     Bathroom Shower/Tub: Producer, television/film/video: Standard     Home Equipment: Agricultural consultant (2 wheels);Shower seat   Additional Comments: home set up from previous admission, pt is a poor  historian,reports no use of RW, will need to confirm      Prior Functioning/Environment Prior Level of Function : Needs assist;History of Falls (last six months)             Mobility Comments: Pt has a RW, reports he does not use it. Fall history. ADLs Comments: Per chart pt did have 24/7 supervision, pt is a poor report        OT Problem List: Decreased strength;Impaired balance (sitting and/or standing);Decreased activity tolerance;Decreased safety awareness;Decreased knowledge of precautions;Decreased knowledge of use of DME or AE      OT Treatment/Interventions: Self-care/ADL training;Energy conservation;Cognitive remediation/compensation;Balance training;Therapeutic exercise;DME and/or AE instruction;Therapeutic activities    OT Goals(Current goals can be found in the care plan section) Acute Rehab OT Goals Patient Stated Goal: go back to bed OT Goal Formulation: With patient Time For Goal Achievement: 06/01/23 Potential to Achieve Goals: Good ADL Goals Pt Will Perform Grooming: with supervision Pt Will Perform Lower Body Dressing: with supervision Pt Will Transfer to Toilet: with supervision Pt Will Perform Toileting - Clothing Manipulation and hygiene: with supervision  OT Frequency: Min 1X/week    Co-evaluation              AM-PAC OT "6 Clicks" Daily Activity     Outcome Measure Help from another person eating meals?: A Little Help from another person taking care of personal grooming?: A Lot Help from another person toileting, which includes using toliet, bedpan, or urinal?: A Lot Help from another person bathing (including washing, rinsing, drying)?: A Lot Help from another person to put on and taking off regular upper body clothing?: A Lot Help from another person to  put on and taking off regular lower body clothing?: A Lot 6 Click Score: 13   End of Session Equipment Utilized During Treatment: Rolling walker (2 wheels);Gait belt Nurse Communication:  Mobility status  Activity Tolerance: Patient tolerated treatment well Patient left: in bed;with call bell/phone within reach;with bed alarm set  OT Visit Diagnosis: Unsteadiness on feet (R26.81);History of falling (Z91.81);Other abnormalities of gait and mobility (R26.89);Muscle weakness (generalized) (M62.81)                Time: 1610-9604 OT Time Calculation (min): 17 min Charges:  OT General Charges $OT Visit: 1 Visit OT Evaluation $OT Eval Low Complexity: 1 Low  Oleta Mouse, OTD OTR/L  05/18/23, 10:00 AM

## 2023-05-18 NOTE — TOC Initial Note (Signed)
Transition of Care [facility]) - Initial/Assessment Note    Patient Details  Name: Charles Sims MRN: [id number] Date of Birth: 08-31-1930  Transition [facility]) CM/SW Contact:    Kemper Durie, RN Phone Number: 05/18/2023, 4:04 PM  Clinical Narrative:                  Spoke with daughter Charles Sims, report patient and his wife live with her.  Dr. Para March is PCP, obtains medications from [facility].  Uses wheelchair, walker, and BSC in the home.  State patient was previously active with [facility] but has gotten more difficult to manage in the home.  She has discussed with the hospice team the need for placement in memory care unit.  Spoke with Revonda Standard at [facility], confirms they have discussed long term care with [facility] and they are anticipating placement for patient.  Will reach out to [facility] to plan for discharge there for short term rehab initially with expectation to transition to long term care with hospice.   Expected Discharge Plan: Skilled Nursing Facility Barriers to Discharge: Continued Medical Work up   Patient Goals and CMS Choice Patient states their goals for this hospitalization and ongoing recovery are:: SNF for rehab, then long term care for Memory Care CMS Medicare.gov Compare Post Acute Care list provided to:: Patient Represenative (must comment) (Daughter Charles Sims) Choice offered to / list presented to : Adult Children      Expected Discharge Plan and Services     Post Acute Care Choice: Skilled Nursing Facility Living arrangements for the past 2 months: Single Family Home                                      Prior Living Arrangements/Services Living arrangements for the past 2 months: Single Family Home Lives with:: Adult Children, Spouse              Current home services: DME    Activities of Daily Living Home Assistive Devices/Equipment: Eyeglasses, Medical laboratory scientific officer (specify quad or straight), Walker (specify type) ADL Screening  (condition at time of admission) Patient's cognitive ability adequate to safely complete daily activities?: Yes Is the patient deaf or have difficulty hearing?: No Does the patient have difficulty seeing, even when wearing glasses/contacts?: No Does the patient have difficulty concentrating, remembering, or making decisions?: Yes Patient able to express need for assistance with ADLs?: Yes Does the patient have difficulty dressing or bathing?: Yes Independently performs ADLs?: No Communication: Independent Dressing (OT): Needs assistance Is this a change from baseline?: Change from baseline, expected to last <3days Grooming: Independent Feeding: Independent Bathing: Needs assistance Is this a change from baseline?: Pre-admission baseline Toileting: Needs assistance Is this a change from baseline?: Change from baseline, expected to last <3 days In/Out Bed: Needs assistance Is this a change from baseline?: Change from baseline, expected to last >3 days Walks in Home: Needs assistance Is this a change from baseline?: Change from baseline, expected to last >3 days Does the patient have difficulty walking or climbing stairs?: Yes Weakness of Legs: Both Weakness of Arms/Hands: None  Permission Sought/Granted                  Emotional Assessment              Admission diagnosis:  Fall [W19.XXXA] Generalized weakness [R53.1] Altered mental status, unspecified altered mental status type [R41.82] Patient Active Problem  List   Diagnosis Date Noted   Dementia without behavioral disturbance (HCC) 05/18/2023   Fall 05/17/2023   Atrial fibrillation, chronic (HCC) 05/17/2023   Hypertensive urgency 05/17/2023   HLD (hyperlipidemia) 05/17/2023   Chronic diastolic CHF (congestive heart failure) (HCC) 05/17/2023   Depression 05/17/2023   Chronic kidney disease, stage 3b (HCC) 05/17/2023   Myocardial injury 05/17/2023   Normocytic anemia 05/17/2023   Atrial fibrillation (HCC)  02/14/2023   Memory loss 02/13/2023   Malnutrition of moderate degree 01/28/2023   Stroke (cerebrum) (HCC) 01/25/2023   Weakness 01/24/2023   Hypokalemia 01/24/2023   Altered mental status 01/24/2023   Stroke (HCC) 01/24/2023   Iron deficiency anemia due to chronic blood loss 05/14/2022   Syncope 05/08/2022   Physical deconditioning 05/08/2022   Acute metabolic encephalopathy 05/14/2019   Community acquired pneumonia of left lower lobe of lung 05/14/2019   Abdominal tenderness, left lower quadrant 05/14/2019   Bradycardia, sinus 02/13/2014   Chest pain 02/13/2014   Breathlessness on exertion 02/13/2014   Essential hypertension 02/13/2014   Carotid stenosis 02/13/2014   Benign prostatic hyperplasia with urinary obstruction 11/03/2012   Chronic prostatitis 11/03/2012   Abnormal prostate specific antigen 11/03/2012   Prostatitis 11/03/2012   Urgency of micturation 11/03/2012   CAD S/P percutaneous coronary angioplasty 06/04/2012   PCP:  Joaquim Nam, MD Pharmacy:   [facility] [address] [place] - [address] [place] [address] Phone: [phone] Fax: [phone]  [facility] - [place], [place] - [address] [place] [address] Phone: [phone] Fax: [phone]     Social Determinants of Health (SDOH) Social History: SDOH Screenings   Food Insecurity: No Food Insecurity (05/17/2023)  Housing: Low Risk  (05/17/2023)  Transportation Needs: No Transportation Needs (05/17/2023)  Utilities: Not At Risk (05/17/2023)  Depression (PHQ2-9): Low Risk  (05/14/2022)  Tobacco Use: Medium Risk (05/17/2023)   SDOH Interventions:     Readmission Risk Interventions     No data to display

## 2023-05-18 NOTE — Progress Notes (Signed)
[facility]  - [facility]   PATIENT NAME: Charles Sims    MR#:  [id number]  DATE OF BIRTH:  [birth year]  SUBJECTIVE:   Patient seen earlier. No family at bedside. Earlier worked with occupational therapist. To me he remembers his name and wife's name and children's name. Does not know the place and time. When asked questions he tells I don't know.   VITALS:  Blood pressure (!) 123/57, pulse 65, temperature (!) 97.2 F (36.2 C), resp. rate 16, height 5\' 10"  (1.778 m), weight 68 kg, SpO2 100%.  PHYSICAL EXAMINATION:  limited exam GENERAL:  88 y.o.-year-old patient with no acute distress. Weak frail malnourished LUNGS: Normal breath sounds bilaterally CARDIOVASCULAR: S1, S2 normal. No murmur   ABDOMEN: Soft, nontender, nondistended.  EXTREMITIES: No  edema b/l.    NEUROLOGIC: nonfocal  patient is alert and awake, confused at baseline. Moves all extremities   LABORATORY PANEL:  CBC Recent Labs  Lab 05/18/23 0349  WBC 5.7  HGB 7.3*  HCT 21.3*  PLT 312    Chemistries  Recent Labs  Lab 05/17/23 0652 05/17/23 0700 05/18/23 0349  NA 139  --  138  K 3.5  --  3.8  CL 106  --  105  CO2 26  --  26  GLUCOSE 87  --  86  BUN 20  --  25*  CREATININE 1.63*  --  1.40*  CALCIUM 8.4*  --  8.1*  MG  --  1.8  --   AST 13*  --   --   ALT 8  --   --   ALKPHOS 74  --   --   BILITOT 0.9  --   --    Cardiac Enzymes No results for input(s): "TROPONINI" in the last 168 hours. RADIOLOGY:  MR BRAIN WO CONTRAST  Result Date: 05/17/2023 CLINICAL DATA:  Mental status change, unknown cause EXAM: MRI HEAD WITHOUT CONTRAST TECHNIQUE: Multiplanar, multiecho pulse sequences of the brain and surrounding structures were obtained without intravenous contrast. COMPARISON:  MRI January 24, 2023. FINDINGS: Brain: No acute infarction, hemorrhage, hydrocephalus, extra-axial collection or mass lesion. Moderate T2/FLAIR hyperintense in the white matter, compatible with chronic  microvascular disease. Cerebral atrophy. Vascular: Major arterial voids are maintained at the skull base. Skull and upper cervical spine: Normal marrow signal. Sinuses/Orbits: Mild paranasal sinus mucosal thickening. No acute orbital findings. Other: No mastoid effusions. IMPRESSION: 1. No evidence of acute intracranial. 2. Chronic microvascular ischemic disease and cerebral atrophy (ICD10-G31.9). Electronically Signed   By: Feliberto Harts M.D.   On: 05/17/2023 14:25   DG Chest 2 View  Result Date: 05/17/2023 CLINICAL DATA:  89 year old male with history of weakness. EXAM: CHEST - 2 VIEW COMPARISON:  Chest x-ray 01/24/2023. FINDINGS: Skin fold artifact projecting over the lateral aspect of the right hemithorax. Lung volumes are low. No consolidative airspace disease. No pleural effusions. No pneumothorax. No pulmonary nodule or mass noted. Pulmonary vasculature and the cardiomediastinal silhouette are within normal limits. Atherosclerotic calcifications are noted in the thoracic aorta. IMPRESSION: 1. Low lung volumes without radiographic evidence of acute cardiopulmonary disease. 2. Aortic atherosclerosis. Electronically Signed   By: Trudie Reed M.D.   On: 05/17/2023 07:55   CT Cervical Spine Wo Contrast  Result Date: 05/17/2023 CLINICAL DATA:  89 year old male found down this morning. Weakness and strong urine odor. EXAM: CT CERVICAL SPINE WITHOUT CONTRAST TECHNIQUE: Multidetector CT imaging of the cervical spine was performed without intravenous contrast. Multiplanar CT image  reconstructions were also generated. RADIATION DOSE REDUCTION: This exam was performed according to the departmental dose-optimization program which includes automated exposure control, adjustment of the mA and/or kV according to patient size and/or use of iterative reconstruction technique. COMPARISON:  Cervical spine CT 05/08/2022. FINDINGS: Alignment: Stable since last year. Straightening of upper cervical lordosis.  Bilateral posterior element alignment is within normal limits. Cervicothoracic junction alignment is within normal limits. Skull base and vertebrae: Bone mineralization is within normal limits for age. Visualized skull base is intact. No atlanto-occipital dissociation. C1 and C2 appear intact and aligned. No acute osseous abnormality identified. Soft tissues and spinal canal: No prevertebral fluid or swelling. No visible canal hematoma. Chronic left carotid space surgical clips. Chronic right suboccipital sebaceous cyst. Disc levels: Chronic cervical spine degeneration, including facet arthropathy. Degenerative left C2-C3 facet ankylosis is newly developing since last year. Multifactorial spinal stenosis appears stable, probably maximal at C5-C6. Upper chest: Visible upper thoracic levels appears stable and intact. Negative lung apices. IMPRESSION: 1. No acute traumatic injury identified in the cervical spine. 2. Chronic cervical spine degeneration with chronic spinal stenosis and developing left C2-C3 facet ankylosis. Electronically Signed   By: Odessa Fleming M.D.   On: 05/17/2023 07:48   CT Head Wo Contrast  Result Date: 05/17/2023 CLINICAL DATA:  89 year old male found down this morning. Weakness and strong urine odor. EXAM: CT HEAD WITHOUT CONTRAST TECHNIQUE: Contiguous axial images were obtained from the base of the skull through the vertex without intravenous contrast. RADIATION DOSE REDUCTION: This exam was performed according to the departmental dose-optimization program which includes automated exposure control, adjustment of the mA and/or kV according to patient size and/or use of iterative reconstruction technique. COMPARISON:  Brain MRI 01/24/2023. Head CT 01/25/2023. FINDINGS: Brain: Stable cerebral volume. No midline shift, ventriculomegaly, mass effect, evidence of mass lesion, intracranial hemorrhage or evidence of cortically based acute infarction. Confluent bilateral cerebral white matter hypodensity  with a small area of left superior perirolandic cortical encephalomalacia is stable. Vascular: Calcified atherosclerosis at the skull base. No suspicious intracranial vascular hyperdensity. Skull: No acute osseous abnormality identified.  TMJ degeneration. Sinuses/Orbits: Visualized paranasal sinuses and mastoids are stable and well aerated. Other: No acute orbit or scalp soft tissue injury is identified. Chronic right suboccipital sebaceous cyst in some scalp vessel calcified atherosclerosis. IMPRESSION: 1. No acute intracranial abnormality or acute traumatic injury identified. 2.  Stable non contrast CT appearance of chronic ischemic disease. Electronically Signed   By: Odessa Fleming M.D.   On: 05/17/2023 07:45    Assessment and Plan   TYRANE ASKAR is a 89 y.o. male with medical history significant of HTN, HLD, CAD, dCHF, stroke, depression, CKD-3B, BPH, carotid artery stenosis, atrial fibrillation not on anticoagulants, anemia, memory loss, who presents with fall.  Patient moves all extremities. No facial droop or slurred speech. He has generalized weakness, no unilateral numbness or tingling in extremities.  His hemoglobin has dropped from 11.0 on 02/12/2023 to 8.3.  Per his son, patient does not have dark stool or rectal bleeding.  Per ED physician, rectal exam is negative for blood.    Altered mental status, confusion, history of advanced dementia -- per daughter [name] patient has been overall declining. Poor PO intake, failure to thrive, remains confused. -- She has been working with outpatient hospice regarding placement. -- TOC for discharge planning. -- Daughter wishes to not pursue any further evaluation or workup.  Fall: Patient has generalized weakness.  No focal neurodeficit on physical examination.  CK level 66.  -Placed on head of bed for position -PT/OT to see pt--reahb -Fall precaution -- MRI for brain to rule out stroke -->negative for new stroke   Essential hypertension and  hypertensive urgency: Bp 202/29 --> 152/79 -IV hydralazine as needed -Amlodipine   CAD S/P percutaneous coronary angioplasty and myocardial injury: trop  51, no CP -Hold aspirin due to worsening anemia, concerning for chronic GI blood loss -Lipitor   Stroke (HCC) -Lipitor -Hold aspirin   Atrial fibrillation, chronic (HCC): Heart rate 49-59.  Not on anticoagulants possibly due to chronic anemia due to GI blood loss --pt trying to pull leads--d/c tele   HLD (hyperlipidemia) -Lipitor   Chronic diastolic CHF (congestive heart failure) (HCC): 2D echo on 01/25/2023 showed EF of 50-55%.  BNP is elevated at 462, but no shortness of breath, no leg edema or JVD.  CHF seem to be compensated. -Watch volume status closely   Chronic kidney disease, stage 3b (HCC): Stable.  Recent baseline creatinine 1.92 on 02/12/2023.  His creatinine is 1.63, BUN 20, GFR 40 today.   Normocytic anemia: Hemoglobin dropped from 11.0 on 02/12/2023 to 8.3 today.  No active bleeding.  No dark stool or rectal bleeding.  FOBT is negative per ED physician. Of note, pt has abnormal differential, with 35% of neutrophil and 55% of lymphocyte. -Goal for transfusion is hemoglobin less than 7 -discussed with patient's daughter [name] oh once. She does not wish to get any G.I. workup of further labs ordered given patient's overall decline and worsening dementia.   Depression -Prozac    DVT ppx: SCD  Code Status: DNR per  his son and dter  Family Communication:   Yes, spoke with dter [name] -- he understands overall poor prognosis  Disposition Plan: TOC for d/c planning  Level of care: Telemetry Medical Status is: Observation The patient remains OBS appropriate and will d/c before 2 midnights.    TOTAL TIME TAKING CARE OF THIS PATIENT: 35 minutes.  >50% time spent on counselling and coordination of care  Note: This dictation was prepared with Dragon dictation along with smaller phrase technology. Any transcriptional errors  that result from this process are unintentional.  Enedina Finner M.D    [facility]   CC: Primary care physician; Joaquim Nam, MD

## 2023-05-19 DIAGNOSIS — I130 Hypertensive heart and chronic kidney disease with heart failure and stage 1 through stage 4 chronic kidney disease, or unspecified chronic kidney disease: Secondary | ICD-10-CM | POA: Diagnosis present

## 2023-05-19 DIAGNOSIS — R4182 Altered mental status, unspecified: Secondary | ICD-10-CM | POA: Diagnosis present

## 2023-05-19 DIAGNOSIS — F32A Depression, unspecified: Secondary | ICD-10-CM | POA: Diagnosis present

## 2023-05-19 DIAGNOSIS — R627 Adult failure to thrive: Secondary | ICD-10-CM | POA: Diagnosis present

## 2023-05-19 DIAGNOSIS — I2510 Atherosclerotic heart disease of native coronary artery without angina pectoris: Secondary | ICD-10-CM | POA: Diagnosis present

## 2023-05-19 DIAGNOSIS — N1832 Chronic kidney disease, stage 3b: Secondary | ICD-10-CM | POA: Diagnosis present

## 2023-05-19 DIAGNOSIS — D649 Anemia, unspecified: Secondary | ICD-10-CM | POA: Diagnosis not present

## 2023-05-19 DIAGNOSIS — I160 Hypertensive urgency: Secondary | ICD-10-CM | POA: Diagnosis present

## 2023-05-19 DIAGNOSIS — Z888 Allergy status to other drugs, medicaments and biological substances status: Secondary | ICD-10-CM | POA: Diagnosis not present

## 2023-05-19 DIAGNOSIS — F0393 Unspecified dementia, unspecified severity, with mood disturbance: Secondary | ICD-10-CM | POA: Diagnosis present

## 2023-05-19 DIAGNOSIS — I5032 Chronic diastolic (congestive) heart failure: Secondary | ICD-10-CM | POA: Diagnosis present

## 2023-05-19 DIAGNOSIS — Z8673 Personal history of transient ischemic attack (TIA), and cerebral infarction without residual deficits: Secondary | ICD-10-CM | POA: Diagnosis not present

## 2023-05-19 DIAGNOSIS — R001 Bradycardia, unspecified: Secondary | ICD-10-CM | POA: Diagnosis present

## 2023-05-19 DIAGNOSIS — E785 Hyperlipidemia, unspecified: Secondary | ICD-10-CM | POA: Diagnosis present

## 2023-05-19 DIAGNOSIS — I5A Non-ischemic myocardial injury (non-traumatic): Secondary | ICD-10-CM | POA: Diagnosis present

## 2023-05-19 DIAGNOSIS — I10 Essential (primary) hypertension: Secondary | ICD-10-CM | POA: Diagnosis not present

## 2023-05-19 DIAGNOSIS — Z87891 Personal history of nicotine dependence: Secondary | ICD-10-CM | POA: Diagnosis not present

## 2023-05-19 DIAGNOSIS — I482 Chronic atrial fibrillation, unspecified: Secondary | ICD-10-CM | POA: Diagnosis present

## 2023-05-19 DIAGNOSIS — E46 Unspecified protein-calorie malnutrition: Secondary | ICD-10-CM | POA: Diagnosis present

## 2023-05-19 DIAGNOSIS — Z820 Family history of epilepsy and other diseases of the nervous system: Secondary | ICD-10-CM | POA: Diagnosis not present

## 2023-05-19 DIAGNOSIS — F0390 Unspecified dementia without behavioral disturbance: Secondary | ICD-10-CM

## 2023-05-19 DIAGNOSIS — Z8051 Family history of malignant neoplasm of kidney: Secondary | ICD-10-CM | POA: Diagnosis not present

## 2023-05-19 DIAGNOSIS — D631 Anemia in chronic kidney disease: Secondary | ICD-10-CM | POA: Diagnosis present

## 2023-05-19 DIAGNOSIS — N401 Enlarged prostate with lower urinary tract symptoms: Secondary | ICD-10-CM | POA: Diagnosis present

## 2023-05-19 DIAGNOSIS — W19XXXA Unspecified fall, initial encounter: Secondary | ICD-10-CM | POA: Diagnosis not present

## 2023-05-19 DIAGNOSIS — Z9181 History of falling: Secondary | ICD-10-CM | POA: Diagnosis not present

## 2023-05-19 DIAGNOSIS — K219 Gastro-esophageal reflux disease without esophagitis: Secondary | ICD-10-CM | POA: Diagnosis present

## 2023-05-19 DIAGNOSIS — Z66 Do not resuscitate: Secondary | ICD-10-CM | POA: Diagnosis present

## 2023-05-19 DIAGNOSIS — R531 Weakness: Secondary | ICD-10-CM | POA: Diagnosis present

## 2023-05-19 LAB — HEMOGLOBIN: Hemoglobin: 7 g/dL — ABNORMAL LOW (ref 13.0–17.0)

## 2023-05-19 MED ORDER — LORAZEPAM 2 MG/ML IJ SOLN
1.0000 mg | Freq: Three times a day (TID) | INTRAMUSCULAR | Status: DC | PRN
  Administered 2023-05-19 – 2023-05-21 (×4): 1 mg via INTRAMUSCULAR
  Filled 2023-05-19 (×4): qty 1

## 2023-05-19 MED ORDER — HALOPERIDOL LACTATE 5 MG/ML IJ SOLN
1.0000 mg | Freq: Four times a day (QID) | INTRAMUSCULAR | Status: DC | PRN
  Administered 2023-05-19 – 2023-05-21 (×6): 2 mg via INTRAMUSCULAR
  Filled 2023-05-19 (×6): qty 1

## 2023-05-19 NOTE — Progress Notes (Signed)
IV team to bedside for evaluation of new IV site as pt has pulled out two IV s in confusion. Discussed patient status, including ongoing confusion and current medical treatments with RN, and possibility of alternate, IM, route for PRN agitation/axiety medications. Rn to discuss with MD, request IV access if necessary.

## 2023-05-19 NOTE — NC FL2 (Signed)
[place] MEDICAID FL2 LEVEL OF CARE FORM     IDENTIFICATION  Patient Name: Charles Sims Birthdate: [birth year] Sex: male Admission Date (Current Location): 05/17/2023  [place] and [place] Number:  Chiropodist and Address:  [facility], [address], [place], [place] [address]      Provider Number: [id number]  Attending Physician Name and Address:  Enedina Finner, MD  Relative Name and Phone Number:  Liliane Bade (Daughter)  [phone]    Current Level of Care: Hospital Recommended Level of Care: Skilled Nursing Facility Prior Approval Number:    Date Approved/Denied:   PASRR Number: Pending  Discharge Plan: SNF    Current Diagnoses: Patient Active Problem List   Diagnosis Date Noted   AMS (altered mental status) 05/19/2023   Dementia without behavioral disturbance (HCC) 05/18/2023   Fall 05/17/2023   Atrial fibrillation, chronic (HCC) 05/17/2023   Hypertensive urgency 05/17/2023   HLD (hyperlipidemia) 05/17/2023   Chronic diastolic CHF (congestive heart failure) (HCC) 05/17/2023   Depression 05/17/2023   Chronic kidney disease, stage 3b (HCC) 05/17/2023   Myocardial injury 05/17/2023   Normocytic anemia 05/17/2023   Atrial fibrillation (HCC) 02/14/2023   Memory loss 02/13/2023   Malnutrition of moderate degree 01/28/2023   Stroke (cerebrum) (HCC) 01/25/2023   Weakness 01/24/2023   Hypokalemia 01/24/2023   Altered mental status 01/24/2023   Stroke (HCC) 01/24/2023   Iron deficiency anemia due to chronic blood loss 05/14/2022   Syncope 05/08/2022   Physical deconditioning 05/08/2022   Acute metabolic encephalopathy 05/14/2019   Community acquired pneumonia of left lower lobe of lung 05/14/2019   Abdominal tenderness, left lower quadrant 05/14/2019   Bradycardia, sinus 02/13/2014   Chest pain 02/13/2014   Breathlessness on exertion 02/13/2014   Essential hypertension 02/13/2014   Carotid stenosis 02/13/2014   Benign  prostatic hyperplasia with urinary obstruction 11/03/2012   Chronic prostatitis 11/03/2012   Abnormal prostate specific antigen 11/03/2012   Prostatitis 11/03/2012   Urgency of micturation 11/03/2012   CAD S/P percutaneous coronary angioplasty 06/04/2012    Orientation RESPIRATION BLADDER Height & Weight     Self  Normal External catheter Weight: 68 kg Height:  5\' 10"  (177.8 cm)  BEHAVIORAL SYMPTOMS/MOOD NEUROLOGICAL BOWEL NUTRITION STATUS      Continent    AMBULATORY STATUS COMMUNICATION OF NEEDS Skin   Limited Assist Verbally Normal                       Personal Care Assistance Level of Assistance  Bathing, Feeding, Dressing Bathing Assistance: Maximum assistance Feeding assistance: Maximum assistance Dressing Assistance: Maximum assistance     Functional Limitations Info             SPECIAL CARE FACTORS FREQUENCY  PT (By licensed PT), OT (By licensed OT)     PT Frequency: 5 times a week OT Frequency: 5 times a week            Contractures Contractures Info: Not present    Additional Factors Info  Code Status, Allergies Code Status Info: DNR Allergies Info: Lisinopril           Current Medications (05/19/2023):  This is the current hospital active medication list Current Facility-Administered Medications  Medication Dose Route Frequency Provider Last Rate Last Admin   acetaminophen (TYLENOL) tablet 650 mg  650 mg Oral Q6H PRN Lorretta Harp, MD   650 mg at 05/19/23 0119   amLODipine (NORVASC) tablet 10 mg  10 mg Oral  Daily Lorretta Harp, MD   10 mg at 05/19/23 0911   atorvastatin (LIPITOR) tablet 80 mg  80 mg Oral Daily Lorretta Harp, MD   80 mg at 05/19/23 0910   FLUoxetine (PROZAC) capsule 20 mg  20 mg Oral Daily Lorretta Harp, MD   20 mg at 05/19/23 1610   haloperidol lactate (HALDOL) injection 1-2 mg  1-2 mg Intramuscular Q6H PRN Enedina Finner, MD       LORazepam (ATIVAN) injection 1 mg  1 mg Intramuscular Q8H PRN Enedina Finner, MD   1 mg at 05/19/23 1254    multivitamin with minerals tablet 1 tablet  1 tablet Oral Daily Lorretta Harp, MD   1 tablet at 05/19/23 0910   ondansetron (ZOFRAN) injection 4 mg  4 mg Intravenous Q8H PRN Lorretta Harp, MD       polyethylene glycol (MIRALAX / GLYCOLAX) packet 17 g  17 g Oral Daily PRN Lorretta Harp, MD       senna-docusate (Senokot-S) tablet 1 tablet  1 tablet Oral QHS PRN Lorretta Harp, MD         Discharge Medications: Please see discharge summary for a list of discharge medications.  Relevant Imaging Results:  Relevant Lab Results:   Additional Information SSN - [id number]  Kemper Durie, RN

## 2023-05-19 NOTE — Progress Notes (Addendum)
[facility]  - [place] at [facility]   PATIENT NAME: Charles Sims    MR#:  [id number]  DATE OF BIRTH:  08-23-1930  SUBJECTIVE:   Patient seen earlier. No family at bedside. Pulled IV out per RN. Will change meds to IM Poor po intake Remains confused. Safety rounds by staff VITALS:  Blood pressure (!) 148/65, pulse 77, temperature (!) 97.2 F (36.2 C), resp. rate 18, height 5\' 10"  (1.778 m), weight 68 kg, SpO2 95%.  PHYSICAL EXAMINATION:  limited exam GENERAL:  89 y.o.-year-old patient with no acute distress. Weak frail malnourished LUNGS: Normal breath sounds bilaterally CARDIOVASCULAR: S1, S2 normal. No murmur   ABDOMEN: Soft, nontender, nondistended.   NEUROLOGIC: nonfocal  patient is sleepy, confused at baseline. Moves all extremities   LABORATORY PANEL:  CBC Recent Labs  Lab 05/18/23 0349 05/19/23 0420  WBC 5.7  --   HGB 7.3* 7.0*  HCT 21.3*  --   PLT 312  --     Chemistries  Recent Labs  Lab 05/17/23 0652 05/17/23 0700 05/18/23 0349  NA 139  --  138  K 3.5  --  3.8  CL 106  --  105  CO2 26  --  26  GLUCOSE 87  --  86  BUN 20  --  25*  CREATININE 1.63*  --  1.40*  CALCIUM 8.4*  --  8.1*  MG  --  1.8  --   AST 13*  --   --   ALT 8  --   --   ALKPHOS 74  --   --   BILITOT 0.9  --   --    Cardiac Enzymes No results for input(s): "TROPONINI" in the last 168 hours. RADIOLOGY:  MR BRAIN WO CONTRAST  Result Date: 05/17/2023 CLINICAL DATA:  Mental status change, unknown cause EXAM: MRI HEAD WITHOUT CONTRAST TECHNIQUE: Multiplanar, multiecho pulse sequences of the brain and surrounding structures were obtained without intravenous contrast. COMPARISON:  MRI January 24, 2023. FINDINGS: Brain: No acute infarction, hemorrhage, hydrocephalus, extra-axial collection or mass lesion. Moderate T2/FLAIR hyperintense in the white matter, compatible with chronic microvascular disease. Cerebral atrophy. Vascular: Major arterial voids are maintained at the  skull base. Skull and upper cervical spine: Normal marrow signal. Sinuses/Orbits: Mild paranasal sinus mucosal thickening. No acute orbital findings. Other: No mastoid effusions. IMPRESSION: 1. No evidence of acute intracranial. 2. Chronic microvascular ischemic disease and cerebral atrophy (ICD10-G31.9). Electronically Signed   By: Feliberto Harts M.D.   On: 05/17/2023 14:25    Assessment and Plan   Charles Sims is a 89 y.o. male with medical history significant of HTN, HLD, CAD, dCHF, stroke, depression, CKD-3B, BPH, carotid artery stenosis, atrial fibrillation not on anticoagulants, anemia, memory loss, who presents with fall.  Patient moves all extremities. No facial droop or slurred speech. He has generalized weakness, no unilateral numbness or tingling in extremities.  His hemoglobin has dropped from 11.0 on 02/12/2023 to 8.3.  Per his son, patient does not have dark stool or rectal bleeding.  Per ED physician, rectal exam is negative for blood.    Altered mental status, confusion, history of advanced dementia -- per daughter [name] patient has been overall declining. Poor PO intake, failure to thrive, remains confused. -- She has been working with outpatient hospice regarding placement. -- TOC for discharge planning. -- Daughter wishes to not pursue any further evaluation or workup.  Fall: Patient has generalized weakness.  No focal neurodeficit on physical examination.  CK level 66.  -Placed on head of bed for position -PT/OT to see pt--reahb -Fall precaution -- MRI for brain to rule out stroke -->negative for new stroke   Essential hypertension and hypertensive urgency: Bp 202/29 --> 152/79 -IV hydralazine as needed -Amlodipine   CAD S/P percutaneous coronary angioplasty and myocardial injury: trop  51, no CP -Hold aspirin due to worsening anemia, concerning for chronic GI blood loss -Lipitor   Stroke (HCC) -Lipitor -Hold aspirin   Atrial fibrillation, chronic (HCC): Heart  rate 49-59.  Not on anticoagulants possibly due to chronic anemia due to GI blood loss --pt trying to pull leads--d/c tele   HLD (hyperlipidemia) -Lipitor   Chronic diastolic CHF (congestive heart failure) (HCC): 2D echo on 01/25/2023 showed EF of 50-55%.  BNP is elevated at 462, but no shortness of breath, no leg edema or JVD.  CHF seem to be compensated. -Watch volume status closely   Chronic kidney disease, stage 3b (HCC): Stable.  Recent baseline creatinine 1.92 on 02/12/2023.  His creatinine is 1.63, BUN 20, GFR 40 today.   Normocytic anemia: Hemoglobin dropped from 11.0 on 02/12/2023 to 8.3 today.  No active bleeding.  No dark stool or rectal bleeding.  FOBT is negative per ED physician. Of note, pt has abnormal differential, with 35% of neutrophil and 55% of lymphocyte. -Goal for transfusion is hemoglobin less than 7 -discussed with patient's daughter [name] on the phone. She does not wish to get any G.I. workup of further labs ordered given patient's overall decline and worsening dementia.   Depression -Prozac    DVT ppx: SCD  Code Status: DNR per  family  Family Communication:   Yes, spoke with dter [name] -- he understands overall poor prognosis  Disposition Plan: TOC for d/c planning    TOTAL TIME TAKING CARE OF THIS PATIENT: 35 minutes.  >50% time spent on counselling and coordination of care  Note: This dictation was prepared with Dragon dictation along with smaller phrase technology. Any transcriptional errors that result from this process are unintentional.  Enedina Finner M.D    [facility]   CC: Primary care physician; Joaquim Nam, MD

## 2023-05-19 NOTE — TOC Progression Note (Signed)
Transition of Care [facility]) - Progression Note    Patient Details  Name: Charles Sims MRN: [id number] Date of Birth: October 01, 1930  Transition of Care [facility]) CM/SW Contact  Kemper Durie, RN Phone Number: 05/19/2023, 2:50 PM  Clinical Narrative:     Bed requested from [facility] as Revonda Standard with [facility] was already working to have patient admitted for LTC/Memory care prior to admission.  Called Verlon Au at [facility] [employer] to discuss plan to discharge for STR then transition to LTC with hospice, message left.   Expected Discharge Plan: Skilled Nursing Facility Barriers to Discharge: Continued Medical Work up  Expected Discharge Plan and Services     Post Acute Care Choice: Skilled Nursing Facility Living arrangements for the past 2 months: Single Family Home                                       Social Determinants of Health (SDOH) Interventions SDOH Screenings   Food Insecurity: No Food Insecurity (05/17/2023)  Housing: Low Risk  (05/17/2023)  Transportation Needs: No Transportation Needs (05/17/2023)  Utilities: Not At Risk (05/17/2023)  Depression (PHQ2-9): Low Risk  (05/14/2022)  Tobacco Use: Medium Risk (05/17/2023)    Readmission Risk Interventions     No data to display

## 2023-05-19 NOTE — Plan of Care (Signed)

## 2023-05-20 DIAGNOSIS — W19XXXA Unspecified fall, initial encounter: Secondary | ICD-10-CM | POA: Diagnosis not present

## 2023-05-20 DIAGNOSIS — D649 Anemia, unspecified: Secondary | ICD-10-CM | POA: Diagnosis not present

## 2023-05-20 DIAGNOSIS — I482 Chronic atrial fibrillation, unspecified: Secondary | ICD-10-CM | POA: Diagnosis not present

## 2023-05-20 DIAGNOSIS — I10 Essential (primary) hypertension: Secondary | ICD-10-CM | POA: Diagnosis not present

## 2023-05-20 LAB — HEMOGLOBIN A1C
Hgb A1c MFr Bld: 5.2 % (ref 4.8–5.6)
Mean Plasma Glucose: 103 mg/dL

## 2023-05-20 NOTE — TOC Progression Note (Addendum)
Transition of Care [facility]) - Progression Note    Patient Details  Name: Charles Sims MRN: [id number] Date of Birth: 05-13-1930  Transition of Care [facility]) CM/SW Contact  Margarito Liner, LCSW Phone Number: 05/20/2023, 10:14 AM  Clinical Narrative:  PASARR obtained: [phone] A. CSW left message for [facility] admissions coordinator to see if they would be able to accept patient straight into LTC/Memory Care or if he would have to start with rehab.  Expected Discharge Plan: Skilled Nursing Facility Barriers to Discharge: Continued Medical Work up  Expected Discharge Plan and Services     Post Acute Care Choice: Skilled Nursing Facility Living arrangements for the past 2 months: Single Family Home                                       Social Determinants of Health (SDOH) Interventions SDOH Screenings   Food Insecurity: No Food Insecurity (05/17/2023)  Housing: Low Risk  (05/17/2023)  Transportation Needs: No Transportation Needs (05/17/2023)  Utilities: Not At Risk (05/17/2023)  Depression (PHQ2-9): Low Risk  (05/14/2022)  Tobacco Use: Medium Risk (05/17/2023)    Readmission Risk Interventions     No data to display

## 2023-05-20 NOTE — Progress Notes (Signed)
OT Cancellation Note  Patient Details Name: NAEEM QUILLIN MRN: [id number] DOB: [birth year]   Cancelled Treatment:    Reason Eval/Treat Not Completed: Other (comment);Medical issues which prohibited therapy. Per chart review, hemoglobin at 7.0 this morning, discussed with RN, no plans to transfuse. Pt sleeping upon arrival, RN requests to let pt sleep as he has been restless today. Will re-attempt as able and medically appropriate.   Lise Auer Danissa Rundle 05/20/2023, 2:44 PM

## 2023-05-20 NOTE — Plan of Care (Signed)
   Problem: Clinical Measurements: Goal: Ability to maintain clinical measurements within normal limits will improve Outcome: Progressing   

## 2023-05-20 NOTE — Progress Notes (Signed)
[facility]  - [place] at [facility]   PATIENT NAME: Charles Sims    MR#:  [id number]  DATE OF BIRTH:  Mar 23, 1930  SUBJECTIVE:   Patient seen earlier. No family at bedside. Poor po intake Remains confused. Safety rounds by staff VITALS:  Blood pressure (!) 117/52, pulse 78, temperature 98.3 F (36.8 C), resp. rate 18, height 5\' 10"  (1.778 m), weight 68 kg, SpO2 97%.  PHYSICAL EXAMINATION:  limited exam GENERAL:  89 y.o.-year-old patient with no acute distress. Weak frail malnourished LUNGS: Normal breath sounds bilaterally CARDIOVASCULAR: S1, S2 normal. No murmur   ABDOMEN: Soft, nontender, nondistended.   NEUROLOGIC: nonfocal  patient is sleepy, confused at baseline. Moves all extremities   LABORATORY PANEL:  CBC Recent Labs  Lab 05/18/23 0349 05/19/23 0420  WBC 5.7  --   HGB 7.3* 7.0*  HCT 21.3*  --   PLT 312  --     Chemistries  Recent Labs  Lab 05/17/23 0652 05/17/23 0700 05/18/23 0349  NA 139  --  138  K 3.5  --  3.8  CL 106  --  105  CO2 26  --  26  GLUCOSE 87  --  86  BUN 20  --  25*  CREATININE 1.63*  --  1.40*  CALCIUM 8.4*  --  8.1*  MG  --  1.8  --   AST 13*  --   --   ALT 8  --   --   ALKPHOS 74  --   --   BILITOT 0.9  --   --    Cardiac Enzymes No results for input(s): "TROPONINI" in the last 168 hours. RADIOLOGY:  No results found.  Assessment and Plan   Charles Sims is a 89 y.o. male with medical history significant of HTN, HLD, CAD, dCHF, stroke, depression, CKD-3B, BPH, carotid artery stenosis, atrial fibrillation not on anticoagulants, anemia, memory loss, who presents with fall.  Patient moves all extremities. No facial droop or slurred speech. He has generalized weakness, no unilateral numbness or tingling in extremities.  His hemoglobin has dropped from 11.0 on 02/12/2023 to 8.3.  Per his son, patient does not have dark stool or rectal bleeding.  Per ED physician, rectal exam is negative for blood.    Altered  mental status, confusion, history of advanced dementia -- per daughter Charles Sims patient has been overall declining. Poor PO intake, failure to thrive, remains confused. -- She has been working with outpatient hospice regarding placement. -- TOC for discharge planning. -- Daughter wishes to not pursue any further evaluation or workup.  Fall: Patient has generalized weakness.  No focal neurodeficit on physical examination.  CK level 66.  -Placed on head of bed for position -PT/OT to see pt--reahb -Fall precaution -- MRI for brain to rule out stroke -->negative for new stroke   Essential hypertension and hypertensive urgency: Bp 202/29 --> 152/79 -IV hydralazine as needed -Amlodipine   CAD S/P percutaneous coronary angioplasty and myocardial injury: trop  51, no CP -Hold aspirin due to worsening anemia, concerning for chronic GI blood loss -Lipitor   Stroke (HCC) -Lipitor -Hold aspirin   Atrial fibrillation, chronic (HCC): Heart rate 49-59.  Not on anticoagulants possibly due to chronic anemia due to GI blood loss --pt trying to pull leads--d/c tele   HLD (hyperlipidemia) -Lipitor   Chronic diastolic CHF (congestive heart failure) (HCC): 2D echo on 01/25/2023 showed EF of 50-55%.  BNP is elevated at 462, but no shortness  of breath, no leg edema or JVD.  CHF seem to be compensated. -Watch volume status closely   Chronic kidney disease, stage 3b (HCC): Stable.  Recent baseline creatinine 1.92 on 02/12/2023.  His creatinine is 1.63, BUN 20, GFR 40 today.   Normocytic anemia: Hemoglobin dropped from 11.0 on 02/12/2023 to 8.3 today.  No active bleeding.  No dark stool or rectal bleeding.  FOBT is negative per ED physician. Of note, pt has abnormal differential, with 35% of neutrophil and 55% of lymphocyte. -Goal for transfusion is hemoglobin less than 7 -discussed with patient's daughter Charles Sims on the phone. She does not wish to get any G.I. workup of further labs ordered given patient's  overall decline and worsening dementia.   Depression -Prozac    DVT ppx: SCD  Code Status: DNR per  family  Family Communication:   Yes, spoke with dter Charles Sims -- she understands overall poor prognosis  Disposition Plan: TOC for d/c planning   Pt medically best at baseline    TOTAL TIME TAKING CARE OF THIS PATIENT: 35 minutes.  >50% time spent on counselling and coordination of care  Note: This dictation was prepared with Dragon dictation along with smaller phrase technology. Any transcriptional errors that result from this process are unintentional.  Enedina Finner M.D    [facility]   CC: Primary care physician; Joaquim Nam, MD

## 2023-05-20 NOTE — Progress Notes (Signed)
PT Cancellation Note  Patient Details Name: Charles Sims MRN: [id number] DOB: 06/01/1930   Cancelled Treatment:    Reason Eval/Treat Not Completed: Fatigue/lethargy limiting ability to participate.Pt sleeping upon entering the room. RN requests we let the patient rest as they have been restless today. Will attempt tomorrow.   Malachi Carl, SPT   Malachi Carl 05/20/2023, 3:02 PM

## 2023-05-20 NOTE — Plan of Care (Signed)

## 2023-05-20 NOTE — Progress Notes (Signed)
I fed pt dinner pt ate 80% of entire dinner. Will encourage oncoming nurse to encourage feedings and nutrition

## 2023-05-21 DIAGNOSIS — W19XXXA Unspecified fall, initial encounter: Secondary | ICD-10-CM | POA: Diagnosis not present

## 2023-05-21 DIAGNOSIS — D649 Anemia, unspecified: Secondary | ICD-10-CM | POA: Diagnosis not present

## 2023-05-21 DIAGNOSIS — I482 Chronic atrial fibrillation, unspecified: Secondary | ICD-10-CM | POA: Diagnosis not present

## 2023-05-21 DIAGNOSIS — I10 Essential (primary) hypertension: Secondary | ICD-10-CM | POA: Diagnosis not present

## 2023-05-21 NOTE — Progress Notes (Signed)
[facility]  - [place] at [facility]   PATIENT NAME: Charles Sims    MR#:  [id number]  DATE OF BIRTH:  02-17-1930  SUBJECTIVE:   Patient seen earlier. No family at bedside. Pt eats when fed by staff Remains confused. Safety rounds by staff VITALS:  Blood pressure 107/65, pulse 68, temperature (!) 97.3 F (36.3 C), temperature source Oral, resp. rate 16, height 5\' 10"  (1.778 m), weight 68 kg, SpO2 98%.  PHYSICAL EXAMINATION:  limited exam GENERAL:  89 y.o.-year-old patient with no acute distress. Weak frail malnourished LUNGS: Normal breath sounds bilaterally CARDIOVASCULAR: S1, S2 normal. No murmur   ABDOMEN: Soft, nontender, nondistended.   NEUROLOGIC: nonfocal  patient is sleepy, confused at baseline. Moves all extremities   LABORATORY PANEL:  CBC Recent Labs  Lab 05/18/23 0349 05/19/23 0420  WBC 5.7  --   HGB 7.3* 7.0*  HCT 21.3*  --   PLT 312  --     Chemistries  Recent Labs  Lab 05/17/23 0652 05/17/23 0700 05/18/23 0349  NA 139  --  138  K 3.5  --  3.8  CL 106  --  105  CO2 26  --  26  GLUCOSE 87  --  86  BUN 20  --  25*  CREATININE 1.63*  --  1.40*  CALCIUM 8.4*  --  8.1*  MG  --  1.8  --   AST 13*  --   --   ALT 8  --   --   ALKPHOS 74  --   --   BILITOT 0.9  --   --    Cardiac Enzymes No results for input(s): "TROPONINI" in the last 168 hours. RADIOLOGY:  No results found.  Assessment and Plan   Charles Sims is a 89 y.o. male with medical history significant of HTN, HLD, CAD, dCHF, stroke, depression, CKD-3B, BPH, carotid artery stenosis, atrial fibrillation not on anticoagulants, anemia, memory loss, who presents with fall.  Patient moves all extremities. No facial droop or slurred speech. He has generalized weakness, no unilateral numbness or tingling in extremities.  His hemoglobin has dropped from 11.0 on 02/12/2023 to 8.3.  Per his son, patient does not have dark stool or rectal bleeding.  Per ED physician, rectal exam is  negative for blood.    Altered mental status, confusion, history of advanced dementia -- per daughter [name] patient has been overall declining. Poor PO intake, failure to thrive, remains confused. -- She has been working with outpatient hospice regarding placement. -- TOC for discharge planning. -- Daughter wishes to not pursue any further evaluation or workup.  Fall: Patient has generalized weakness.  No focal neurodeficit on physical examination.  CK level 66.  -Placed on head of bed for position -Fall precaution -- MRI for brain -->negative for new stroke   Essential hypertension and hypertensive urgency: Bp 202/29 --> 152/79 -IV hydralazine as needed -Amlodipine   CAD S/P percutaneous coronary angioplasty and myocardial injury: trop  51, no CP -Hold aspirin due to worsening anemia, concerning for chronic GI blood loss -Lipitor   Stroke (HCC) -Lipitor -Hold aspirin   Atrial fibrillation, chronic (HCC): Heart rate 49-59.  Not on anticoagulants possibly due to chronic anemia due to GI blood loss --pt trying to pull leads--d/c tele   HLD (hyperlipidemia) -Lipitor   Chronic diastolic CHF (congestive heart failure) (HCC): 2D echo on 01/25/2023 showed EF of 50-55%.  BNP is elevated at 462, but no shortness of breath,  no leg edema or JVD.  CHF seem to be compensated. -Watch volume status closely   Chronic kidney disease, stage 3b (HCC): Stable.  Recent baseline creatinine 1.92 on 02/12/2023.  His creatinine is 1.63, BUN 20, GFR 40 today.   Normocytic anemia: Hemoglobin dropped from 11.0 on 02/12/2023 to 8.3 today.  No active bleeding.  No dark stool or rectal bleeding.  FOBT is negative per ED physician. Of note, pt has abnormal differential, with 35% of neutrophil and 55% of lymphocyte. -Goal for transfusion is hemoglobin less than 7 -discussed with patient's daughter [name] on the phone. She does not wish to get any G.I. workup of further labs ordered given patient's overall decline  and worsening dementia.   Depression -Prozac    DVT ppx: SCD  Code Status: DNR per  family  Family Communication:   Yes, spoke with dter [name] -- she understands overall poor prognosis  Disposition Plan: TOC for d/c planning   Pt medically best at baseline for d/c to LTC.     TOTAL TIME TAKING CARE OF THIS PATIENT: 35 minutes.  >50% time spent on counselling and coordination of care  Note: This dictation was prepared with Dragon dictation along with smaller phrase technology. Any transcriptional errors that result from this process are unintentional.  Enedina Finner M.D    [facility]   CC: Primary care physician; Joaquim Nam, MD

## 2023-05-21 NOTE — TOC Progression Note (Signed)
Transition of Care [facility]) - Progression Note    Patient Details  Name: Charles Sims MRN: [id number] Date of Birth: 12-Oct-1930  Transition of Care [facility]) CM/SW Contact  Charles Fitch, RN Phone Number: 05/21/2023, 1:58 PM  Clinical Narrative:     Charles Sims with [facility] confirms patient has rescinded hospice benefits   Per Charles Sims with [employer] since patient has rescinded his hospice services he would come for STR under his medicare benefits and would not need auth.  They would then transition to LTC with hospice  Discussed with daughter Charles Sims and she is in agreement   Expected Discharge Plan: Skilled Nursing Facility Barriers to Discharge: Continued Medical Work up  Expected Discharge Plan and Services     Post Acute Care Choice: Skilled Nursing Facility Living arrangements for the past 2 months: Single Family Home                                       Social Determinants of Health (SDOH) Interventions SDOH Screenings   Food Insecurity: No Food Insecurity (05/17/2023)  Housing: Low Risk  (05/17/2023)  Transportation Needs: No Transportation Needs (05/17/2023)  Utilities: Not At Risk (05/17/2023)  Depression (PHQ2-9): Low Risk  (05/14/2022)  Tobacco Use: Medium Risk (05/17/2023)    Readmission Risk Interventions     No data to display

## 2023-05-21 NOTE — Plan of Care (Signed)
  Problem: Education: Goal: Knowledge of General Education information will improve Description: Including pain rating scale, medication(s)/side effects and non-pharmacologic comfort measures 05/21/2023 1720 by Alver Fisher, RN Outcome: Progressing 05/21/2023 1658 by Alver Fisher, RN Outcome: Progressing   Problem: Health Behavior/Discharge Planning: Goal: Ability to manage health-related needs will improve 05/21/2023 1720 by Alver Fisher, RN Outcome: Progressing 05/21/2023 1658 by Alver Fisher, RN Outcome: Progressing   Problem: Clinical Measurements: Goal: Ability to maintain clinical measurements within normal limits will improve 05/21/2023 1720 by Alver Fisher, RN Outcome: Progressing 05/21/2023 1658 by Tereasa Coop D, RN Outcome: Progressing Goal: Will remain free from infection 05/21/2023 1720 by Alver Fisher, RN Outcome: Progressing 05/21/2023 1658 by Tereasa Coop D, RN Outcome: Progressing Goal: Diagnostic test results will improve 05/21/2023 1720 by Alver Fisher, RN Outcome: Progressing 05/21/2023 1658 by Tereasa Coop D, RN Outcome: Progressing Goal: Respiratory complications will improve 05/21/2023 1720 by Alver Fisher, RN Outcome: Progressing 05/21/2023 1658 by Tereasa Coop D, RN Outcome: Progressing Goal: Cardiovascular complication will be avoided 05/21/2023 1720 by Alver Fisher, RN Outcome: Progressing 05/21/2023 1658 by Alver Fisher, RN Outcome: Progressing   Problem: Activity: Goal: Risk for activity intolerance will decrease 05/21/2023 1720 by Alver Fisher, RN Outcome: Progressing 05/21/2023 1658 by Tereasa Coop D, RN Outcome: Progressing   Problem: Nutrition: Goal: Adequate nutrition will be maintained 05/21/2023 1720 by Alver Fisher, RN Outcome: Progressing 05/21/2023 1658 by Tereasa Coop D, RN Outcome: Progressing   Problem: Coping: Goal: Level  of anxiety will decrease 05/21/2023 1720 by Alver Fisher, RN Outcome: Progressing 05/21/2023 1658 by Alver Fisher, RN Outcome: Progressing   Problem: Elimination: Goal: Will not experience complications related to bowel motility 05/21/2023 1720 by Alver Fisher, RN Outcome: Progressing 05/21/2023 1658 by Alver Fisher, RN Outcome: Progressing Goal: Will not experience complications related to urinary retention 05/21/2023 1720 by Alver Fisher, RN Outcome: Progressing 05/21/2023 1658 by Alver Fisher, RN Outcome: Progressing   Problem: Pain Managment: Goal: General experience of comfort will improve 05/21/2023 1720 by Alver Fisher, RN Outcome: Progressing 05/21/2023 1658 by Tereasa Coop D, RN Outcome: Progressing   Problem: Safety: Goal: Ability to remain free from injury will improve 05/21/2023 1720 by Alver Fisher, RN Outcome: Progressing 05/21/2023 1658 by Tereasa Coop D, RN Outcome: Progressing   Problem: Skin Integrity: Goal: Risk for impaired skin integrity will decrease 05/21/2023 1720 by Alver Fisher, RN Outcome: Progressing 05/21/2023 1658 by Alver Fisher, RN Outcome: Progressing

## 2023-05-21 NOTE — Plan of Care (Signed)

## 2023-05-21 NOTE — Progress Notes (Signed)
Physical Therapy Treatment Patient Details Name: Charles Sims MRN: [id number] DOB: 05/28/1930 Today's Date: 05/21/2023   History of Present Illness Pt is a 89 year old male presenting with a fall; PMH significant for HTN, HLD, CAD, dCHF, stroke, depression, CKD-3B, BPH, carotid artery stenosis, atrial fibrillation not on anticoagulants, anemia, memory loss    PT Comments  Discussed pt with nursing. Hgb remains at 7.0 without plans to transfuse. Pt cleared for attempted PT session by nursing, however remains very lethargic possibly from receiving Haldol last night. PT session limited to repositioning in bed to facilitate upright posture and skin integrity. Attempted B LE ROM, pt resistant and moaning with minimal range. Will continue PT per POC and progress as appropriate.     Assistance Recommended at Discharge Frequent or constant Supervision/Assistance  If plan is discharge home, recommend the following:  Can travel by private vehicle    A lot of help with bathing/dressing/bathroom;Assistance with cooking/housework;Assistance with feeding;Assist for transportation;A little help with walking and/or transfers   No  Equipment Recommendations  Other (comment) (TBD at next facility)    Recommendations for Other Services       Precautions / Restrictions Precautions Precautions: Fall Restrictions Weight Bearing Restrictions: No     Mobility  Bed Mobility               General bed mobility comments:  (Total assist this date due to lethargy and possible side effects of Haldol)    Transfers                   General transfer comment:  (Unable to safely complete)    Ambulation/Gait               General Gait Details:  (Unable to safely complete due to lethargy)   Stairs             Wheelchair Mobility     Tilt Bed    Modified Rankin (Stroke Patients Only)       Balance                                             Cognition Arousal/Alertness: Lethargic Behavior During Therapy: Flat affect Overall Cognitive Status: History of cognitive impairments - at baseline                                 General Comments:  (Pt very lethargic, unable to vocalize specific needs.)        Exercises General Exercises - Lower Extremity Ankle Circles/Pumps: PROM, Both, 5 reps, Supine Heel Slides: PROM, Both, 5 reps, Supine Hip ABduction/ADduction: PROM, Both, 5 reps, Supine    General Comments General comments (skin integrity, edema, etc.):  (Multiple attempts to arouse pt for EOB activity, pt assisted with repositioning up in bed and supported with pillows to promote skin integrity and reduce breakdown from immobility)      Pertinent Vitals/Pain Pain Assessment Pain Assessment: Faces Faces Pain Scale: Hurts little more Negative Vocalization: occasional moan/groan, low speech, negative/disapproving quality Facial Expression: facial grimacing Pain Descriptors / Indicators: Grimacing, Guarding, Moaning Pain Intervention(s): Limited activity within patient's tolerance, Repositioned (Pt unable to specifiy pain location)    Home Living  Prior Function            PT Goals (current goals can now be found in the care plan section) Acute Rehab PT Goals Patient Stated Goal: To return home    Frequency    Min 2X/week      PT Plan      Co-evaluation              AM-PAC PT "6 Clicks" Mobility   Outcome Measure  Help needed turning from your back to your side while in a flat bed without using bedrails?: None Help needed moving from lying on your back to sitting on the side of a flat bed without using bedrails?: A Little Help needed moving to and from a bed to a chair (including a wheelchair)?: A Lot Help needed standing up from a chair using your arms (e.g., wheelchair or bedside chair)?: A Lot Help needed to walk in hospital room?: A Lot Help  needed climbing 3-5 steps with a railing? : A Lot 6 Click Score: 15    End of Session   Activity Tolerance: Patient limited by lethargy Patient left: in bed;with call bell/phone within reach;with bed alarm set;with SCD's reapplied Nurse Communication: Mobility status (Poor tolerance for activity) PT Visit Diagnosis: Unsteadiness on feet (R26.81);Repeated falls (R29.6);Muscle weakness (generalized) (M62.81)     Time: 9629-5284 PT Time Calculation (min) (ACUTE ONLY): 9 min  Charges:    $Therapeutic Exercise: 8-22 mins PT General Charges $$ ACUTE PT VISIT: 1 Visit                    Zadie Cleverly, PTA  Jannet Askew 05/21/2023, 12:23 PM

## 2023-05-21 NOTE — TOC Progression Note (Signed)
Transition of Care [facility]) - Progression Note    Patient Details  Name: Charles Sims MRN: [id number] Date of Birth: 1930/08/10  [facility] CM/SW Contact  Chapman Fitch, RN Phone Number: 05/21/2023, 12:12 PM  Clinical Narrative:     VM list for Tiffany at [facility] to follow up about ltc/memory care vs rehab   Expected Discharge Plan: Skilled Nursing Facility Barriers to Discharge: Continued Medical Work up  Expected Discharge Plan and Services     Post Acute Care Choice: Skilled Nursing Facility Living arrangements for the past 2 months: Single Family Home                                       Social Determinants of Health (SDOH) Interventions SDOH Screenings   Food Insecurity: No Food Insecurity (05/17/2023)  Housing: Low Risk  (05/17/2023)  Transportation Needs: No Transportation Needs (05/17/2023)  Utilities: Not At Risk (05/17/2023)  Depression (PHQ2-9): Low Risk  (05/14/2022)  Tobacco Use: Medium Risk (05/17/2023)    Readmission Risk Interventions     No data to display

## 2023-05-21 NOTE — Progress Notes (Signed)
Occupational Therapy Treatment Patient Details Name: Charles Sims MRN: [id number] DOB: 13-Aug-1930 Today's Date: 05/21/2023   History of present illness Pt is a 89 year old male presenting with a fall; PMH significant for HTN, HLD, CAD, dCHF, stroke, depression, CKD-3B, BPH, carotid artery stenosis, atrial fibrillation not on anticoagulants, anemia, memory loss   OT comments  Pt seen for OT treatment on this date. Hgb 7 with no plans to transfuse. Upon arrival to room pt on bedpan, lethargic and awakens with multimodal cuing.  Unable to verbalize besides basic yes/no d/t lethargy.  OT facilitated ADL management as described below. See ADL section for additional details regarding occupational performance. Pt continues to be functionally limited by decreased level of arousal, cognitive impairment, decreased ADL status and limited functional mobility. Pt making limited progress towards functional gains due to lethargy. Will continue to follow POC as written. Discharge recommendation updated.   Recommendations for follow up therapy are one component of a multi-disciplinary discharge planning process, led by the attending physician.  Recommendations may be updated based on patient status, additional functional criteria and insurance authorization.    Assistance Recommended at Discharge Frequent or constant Supervision/Assistance  Patient can return home with the following  A lot of help with walking and/or transfers;A lot of help with bathing/dressing/bathroom;Direct supervision/assist for medications management;Direct supervision/assist for financial management;Assist for transportation;Help with stairs or ramp for entrance         Precautions / Restrictions Precautions Precautions: Fall Restrictions Weight Bearing Restrictions: No       Mobility Bed Mobility Overal bed mobility: Needs Assistance Bed Mobility: Rolling Rolling: Min assist         General bed mobility comments: Inititates  movement, lethargic    Transfers                   General transfer comment: Did not attempt, pt on bed pan. Level of arousal not safe to complete.         ADL either performed or assessed with clinical judgement   ADL Overall ADL's : Needs assistance/impaired     Grooming: Wash/dry face;Wash/dry hands;Minimal assistance;Cueing for sequencing;Bed level                                 General ADL Comments: Pt able to initate roll to R with increased multimodal cuing for OT to check bedpan. Still having BM. OT facilitates grooming, repositioning and AROM of BLE/UE. Min multimodal cuing to sequence washing face      Cognition Arousal/Alertness: Lethargic Behavior During Therapy: Flat affect Overall Cognitive Status: History of cognitive impairments - at baseline                                 General Comments: lethargic, responds with "yes" "no" and head nods        Exercises Exercises: General Upper Extremity, General Lower Extremity General Exercises - Upper Extremity Shoulder Flexion: AROM, Both, 5 reps General Exercises - Lower Extremity Ankle Circles/Pumps: AROM, 5 reps, Supine Heel Slides: Supine, Both, AROM Toe Raises: AROM, 5 reps, Both, Supine            Pertinent Vitals/ Pain       Pain Assessment Pain Assessment: Faces Faces Pain Scale: Hurts a little bit Pain Intervention(s): Limited activity within patient's tolerance   Frequency  Min 1X/week  Progress Toward Goals  OT Goals(current goals can now be found in the care plan section)  Progress towards OT goals: OT to reassess next treatment;Not progressing toward goals - comment     Plan Discharge plan needs to be updated    Co-evaluation                 AM-PAC OT "6 Clicks" Daily Activity     Outcome Measure   Help from another person eating meals?: A Little Help from another person taking care of personal grooming?: A Lot Help from  another person toileting, which includes using toliet, bedpan, or urinal?: A Lot Help from another person bathing (including washing, rinsing, drying)?: A Lot Help from another person to put on and taking off regular upper body clothing?: A Lot Help from another person to put on and taking off regular lower body clothing?: A Lot 6 Click Score: 13    End of Session    OT Visit Diagnosis: Unsteadiness on feet (R26.81);History of falling (Z91.81);Other abnormalities of gait and mobility (R26.89);Muscle weakness (generalized) (M62.81)   Activity Tolerance Patient limited by lethargy   Patient Left in bed;with call bell/phone within reach;with bed alarm set   Nurse Communication Mobility status        Time: 1610-9604 OT Time Calculation (min): 10 min  Charges: OT General Charges $OT Visit: 1 Visit OT Treatments $Self Care/Home Management : 8-22 mins  [name], OTR/L  05/21/23, 4:00 PM

## 2023-05-21 NOTE — Progress Notes (Signed)
I have reviewed and concur with this student's documentation.   Leodis Sias, RN 05/21/2023 11:47 AM

## 2023-05-22 DIAGNOSIS — R4182 Altered mental status, unspecified: Secondary | ICD-10-CM

## 2023-05-22 DIAGNOSIS — R531 Weakness: Secondary | ICD-10-CM

## 2023-05-22 DIAGNOSIS — W19XXXA Unspecified fall, initial encounter: Secondary | ICD-10-CM | POA: Diagnosis not present

## 2023-05-22 MED ORDER — SENNA 8.6 MG PO TABS: 1.0000 | ORAL_TABLET | Freq: Every day | ORAL | 0 refills | Status: DC | PRN

## 2023-05-22 MED ORDER — ACETAMINOPHEN 325 MG PO TABS: 650.0000 mg | ORAL_TABLET | Freq: Four times a day (QID) | ORAL | Status: DC | PRN

## 2023-05-22 NOTE — TOC Transition Note (Signed)
Transition of Care [facility]) - CM/SW Discharge Note   Patient Details  Name: Charles Sims MRN: [id number] Date of Birth: Jul 09, 1930  [facility] CM/SW Contact:  Chapman Fitch, RN Phone Number: 05/22/2023, 1:34 PM   Clinical Narrative:     Patient will DC to: [facility] Anticipated DC date:05/22/23  Family notified: daughter sherri Transport WU:[id number]  Per MD patient ready for DC to . RN, patient, patient's family, and facility notified of DC. Discharge Summary sent to facility. RN given number for report. DC packet on chart. Ambulance transport requested for patient.  TOC signing off.     Barriers to Discharge: Continued Medical Work up   Patient Goals and CMS Choice CMS Medicare.gov Compare Post Acute Care list provided to:: Patient Represenative (must comment) (Daughter [name]) Choice offered to / list presented to : Adult Children  Discharge Placement                         Discharge Plan and Services Additional resources added to the After Visit Summary for       Post Acute Care Choice: Skilled Nursing Facility                               Social Determinants of Health (SDOH) Interventions SDOH Screenings   Food Insecurity: No Food Insecurity (05/17/2023)  Housing: Low Risk  (05/17/2023)  Transportation Needs: No Transportation Needs (05/17/2023)  Utilities: Not At Risk (05/17/2023)  Depression (PHQ2-9): Low Risk  (05/14/2022)  Tobacco Use: Medium Risk (05/17/2023)     Readmission Risk Interventions     No data to display

## 2023-05-22 NOTE — Care Management Important Message (Signed)
Important Message  Patient Details  Name: Charles Sims MRN: [id number] Date of Birth: 01-24-1930   Medicare Important Message Given:  N/A - LOS <3 / Initial given by admissions     Johnell Comings 05/22/2023, 10:56 AM

## 2023-05-22 NOTE — Progress Notes (Signed)
Telephone report given to Crystal, LPN at receiving facility; all questions answered.

## 2023-05-22 NOTE — Discharge Summary (Addendum)
Physician Discharge Summary  Patient: Charles Sims UJW:[id number] DOB: 02-21-1930   Code Status: DNR Admit date: 05/17/2023 Discharge date: 05/22/2023 Disposition: Skilled nursing facility, PT, OT, SLP, nurse aid, and social worker PCP: Joaquim Nam, MD  Recommendations for Outpatient Follow-up:  Follow up with PCP within 1-2 weeks Regarding general hospital follow up and preventative care Recommend follow up with hospice and further discuss GOC  Discharge Diagnoses:  Principal Problem:   Fall Active Problems:   Essential hypertension   Hypertensive urgency   CAD S/P percutaneous coronary angioplasty   Myocardial injury   Stroke [facility])   Atrial fibrillation, chronic (HCC)   HLD (hyperlipidemia)   Chronic diastolic CHF (congestive heart failure) (HCC)   Chronic kidney disease, stage 3b (HCC)   Normocytic anemia   Depression   Dementia without behavioral disturbance (HCC)   AMS (altered mental status)  Brief Hospital Course Summary: Charles Sims is a 89 y.o. male with medical history significant of HTN, HLD, CAD, dCHF, stroke, depression, CKD-3B, BPH, carotid artery stenosis, atrial fibrillation not on anticoagulants, anemia, memory loss, who presents with fall. On presentation, patient moves all extremities. No facial droop or slurred speech. He has generalized weakness, no unilateral numbness or tingling in extremities.  Imaging did not reveal any acute injuries from the fall or causes of his altered mental status. Brain MRI negative for acute strokes.   Of note, his hemoglobin has dropped from 11.0 on 02/12/2023 to 8.3 on admission.  Per his son, patient does not have dark stool or rectal bleeding.  Per ED physician, rectal exam is negative for blood. GI consult was discussed with his daughter, [name] and was declined further workup at this time.   PT/OT evaluated and recommended SNF for further rehabilitation after discharge. Per his daughter, he has had a gradual decline  in his function over the recent past and they have established care with hospice. He will follow with hospice after discharge.   Of note, had significantly elevated blood pressures on presentation with 200s systolics. His pressures greatly improved within normal limits when treated with home amlodipine.   Discharge Condition: Stable, improved Recommended discharge diet: Regular healthy diet  Consultations: None   Procedures/Studies: Brain MRI  Allergies as of 05/22/2023       Reactions   Lisinopril Cough        Medication List     STOP taking these medications    senna-docusate 8.6-50 MG tablet Commonly known as: Senokot-S       TAKE these medications    acetaminophen 325 MG tablet Commonly known as: TYLENOL Take 2 tablets (650 mg total) by mouth every 6 (six) hours as needed for mild pain or fever.   amLODipine 10 MG tablet Commonly known as: NORVASC Take 1 tablet (10 mg total) by mouth daily.   aspirin EC 81 MG tablet Take 1 tablet (81 mg total) by mouth daily. Swallow whole.   atorvastatin 80 MG tablet Commonly known as: LIPITOR Take 1 tablet (80 mg total) by mouth daily.   FLUoxetine 20 MG capsule Commonly known as: PROZAC Take 1 capsule (20 mg total) by mouth daily.   multivitamin with minerals Tabs tablet Take 1 tablet by mouth daily.   polyethylene glycol 17 g packet Commonly known as: MIRALAX / GLYCOLAX Take 17 g by mouth daily as needed for moderate constipation.   senna 8.6 MG Tabs tablet Commonly known as: SENOKOT Take 1 tablet (8.6 mg total) by mouth daily as  needed for mild constipation.       Subjective   Pt reports no complaints today. He states he feels good. Denies pain. He is not oriented to the circumstances of him being in the hospital or where he is going to be discharged to.  All questions and concerns were addressed at time of discharge.  Objective  Blood pressure (!) 109/57, pulse 70, temperature 98.3 F (36.8 C), resp.  rate 18, height 5\' 10"  (1.778 m), weight 68 kg, SpO2 98%.   General: Pt is alert, awake, not in acute distress Cardiovascular: RRR, S1/S2 +, no rubs, no gallops Respiratory: CTA bilaterally, no wheezing, no rhonchi Abdominal: Soft, NT, ND, bowel sounds + Extremities: no edema, no cyanosis  The results of significant diagnostics from this hospitalization (including imaging, microbiology, ancillary and laboratory) are listed below for reference.   Imaging studies: MR BRAIN WO CONTRAST  Result Date: 05/17/2023 CLINICAL DATA:  Mental status change, unknown cause EXAM: MRI HEAD WITHOUT CONTRAST TECHNIQUE: Multiplanar, multiecho pulse sequences of the brain and surrounding structures were obtained without intravenous contrast. COMPARISON:  MRI January 24, 2023. FINDINGS: Brain: No acute infarction, hemorrhage, hydrocephalus, extra-axial collection or mass lesion. Moderate T2/FLAIR hyperintense in the white matter, compatible with chronic microvascular disease. Cerebral atrophy. Vascular: Major arterial voids are maintained at the skull base. Skull and upper cervical spine: Normal marrow signal. Sinuses/Orbits: Mild paranasal sinus mucosal thickening. No acute orbital findings. Other: No mastoid effusions. IMPRESSION: 1. No evidence of acute intracranial. 2. Chronic microvascular ischemic disease and cerebral atrophy (ICD10-G31.9). Electronically Signed   By: Feliberto Harts M.D.   On: 05/17/2023 14:25   DG Chest 2 View  Result Date: 05/17/2023 CLINICAL DATA:  89 year old male with history of weakness. EXAM: CHEST - 2 VIEW COMPARISON:  Chest x-ray 01/24/2023. FINDINGS: Skin fold artifact projecting over the lateral aspect of the right hemithorax. Lung volumes are low. No consolidative airspace disease. No pleural effusions. No pneumothorax. No pulmonary nodule or mass noted. Pulmonary vasculature and the cardiomediastinal silhouette are within normal limits. Atherosclerotic calcifications are noted in the  thoracic aorta. IMPRESSION: 1. Low lung volumes without radiographic evidence of acute cardiopulmonary disease. 2. Aortic atherosclerosis. Electronically Signed   By: Trudie Reed M.D.   On: 05/17/2023 07:55   CT Cervical Spine Wo Contrast  Result Date: 05/17/2023 CLINICAL DATA:  89 year old male found down this morning. Weakness and strong urine odor. EXAM: CT CERVICAL SPINE WITHOUT CONTRAST TECHNIQUE: Multidetector CT imaging of the cervical spine was performed without intravenous contrast. Multiplanar CT image reconstructions were also generated. RADIATION DOSE REDUCTION: This exam was performed according to the departmental dose-optimization program which includes automated exposure control, adjustment of the mA and/or kV according to patient size and/or use of iterative reconstruction technique. COMPARISON:  Cervical spine CT 05/08/2022. FINDINGS: Alignment: Stable since last year. Straightening of upper cervical lordosis. Bilateral posterior element alignment is within normal limits. Cervicothoracic junction alignment is within normal limits. Skull base and vertebrae: Bone mineralization is within normal limits for age. Visualized skull base is intact. No atlanto-occipital dissociation. C1 and C2 appear intact and aligned. No acute osseous abnormality identified. Soft tissues and spinal canal: No prevertebral fluid or swelling. No visible canal hematoma. Chronic left carotid space surgical clips. Chronic right suboccipital sebaceous cyst. Disc levels: Chronic cervical spine degeneration, including facet arthropathy. Degenerative left C2-C3 facet ankylosis is newly developing since last year. Multifactorial spinal stenosis appears stable, probably maximal at C5-C6. Upper chest: Visible upper thoracic levels appears stable and  intact. Negative lung apices. IMPRESSION: 1. No acute traumatic injury identified in the cervical spine. 2. Chronic cervical spine degeneration with chronic spinal stenosis and  developing left C2-C3 facet ankylosis. Electronically Signed   By: Odessa Fleming M.D.   On: 05/17/2023 07:48   CT Head Wo Contrast  Result Date: 05/17/2023 CLINICAL DATA:  89 year old male found down this morning. Weakness and strong urine odor. EXAM: CT HEAD WITHOUT CONTRAST TECHNIQUE: Contiguous axial images were obtained from the base of the skull through the vertex without intravenous contrast. RADIATION DOSE REDUCTION: This exam was performed according to the departmental dose-optimization program which includes automated exposure control, adjustment of the mA and/or kV according to patient size and/or use of iterative reconstruction technique. COMPARISON:  Brain MRI 01/24/2023. Head CT 01/25/2023. FINDINGS: Brain: Stable cerebral volume. No midline shift, ventriculomegaly, mass effect, evidence of mass lesion, intracranial hemorrhage or evidence of cortically based acute infarction. Confluent bilateral cerebral white matter hypodensity with a small area of left superior perirolandic cortical encephalomalacia is stable. Vascular: Calcified atherosclerosis at the skull base. No suspicious intracranial vascular hyperdensity. Skull: No acute osseous abnormality identified.  TMJ degeneration. Sinuses/Orbits: Visualized paranasal sinuses and mastoids are stable and well aerated. Other: No acute orbit or scalp soft tissue injury is identified. Chronic right suboccipital sebaceous cyst in some scalp vessel calcified atherosclerosis. IMPRESSION: 1. No acute intracranial abnormality or acute traumatic injury identified. 2.  Stable non contrast CT appearance of chronic ischemic disease. Electronically Signed   By: Odessa Fleming M.D.   On: 05/17/2023 07:45    Labs: Basic Metabolic Panel: Recent Labs  Lab 05/17/23 0652 05/17/23 0700 05/18/23 0349  NA 139  --  138  K 3.5  --  3.8  CL 106  --  105  CO2 26  --  26  GLUCOSE 87  --  86  BUN 20  --  25*  CREATININE 1.63*  --  1.40*  CALCIUM 8.4*  --  8.1*  MG  --  1.8   --    CBC: Recent Labs  Lab 05/17/23 0652 05/17/23 1005 05/17/23 1804 05/18/23 0349 05/19/23 0420  WBC 6.3 6.3 6.0 5.7  --   NEUTROABS 2.2  --   --   --   --   HGB 8.3* 7.8* 8.2* 7.3* 7.0*  HCT 24.6* 23.3* 24.3* 21.3*  --   MCV 98.4 97.9 97.2 97.7  --   PLT 333 301 333 312  --    Microbiology: Results for orders placed or performed during the hospital encounter of 05/08/22  Culture, blood (Routine X 2) w Reflex to ID Panel     Status: None   Collection Time: 05/08/22  2:24 PM   Specimen: BLOOD  Result Value Ref Range Status   Specimen Description BLOOD BLOOD LEFT FOREARM  Final   Special Requests   Final    BOTTLES DRAWN AEROBIC AND ANAEROBIC Blood Culture adequate volume   Culture   Final    NO GROWTH 5 DAYS Performed at [facility], [address], [place], [place] [address]    Report Status 05/13/2022 FINAL  Final  Culture, blood (Routine X 2) w Reflex to ID Panel     Status: None   Collection Time: 05/08/22  2:24 PM   Specimen: BLOOD  Result Value Ref Range Status   Specimen Description BLOOD RIGHT ANTECUBITAL  Final   Special Requests   Final    BOTTLES DRAWN AEROBIC AND ANAEROBIC Blood Culture adequate volume  Culture   Final    NO GROWTH 5 DAYS Performed at [facility], [address] [place] [place], [place] [address]    Report Status 05/13/2022 FINAL  Final   Time coordinating discharge: Over 30 minutes  Leeroy Bock, MD  [facility] 05/22/2023, 12:08 PM

## 2023-05-22 NOTE — Plan of Care (Signed)
Resolve Care plan, transition to lower level of care.  Cornell Barman Pamla Pangle

## 2023-05-22 NOTE — Plan of Care (Signed)

## 2023-05-27 ENCOUNTER — Emergency Department
Admission: EM | Admit: 2023-05-27 | Discharge: 2023-05-27 | Disposition: A | Discharge status: Home / Self Care | Payer: Medicare Other | Source: Home / Self Care | Attending: Emergency Medicine | Admitting: Emergency Medicine

## 2023-05-27 ENCOUNTER — Other Ambulatory Visit: Payer: Self-pay

## 2023-05-27 DIAGNOSIS — E785 Hyperlipidemia, unspecified: Secondary | ICD-10-CM | POA: Diagnosis not present

## 2023-05-27 DIAGNOSIS — R918 Other nonspecific abnormal finding of lung field: Secondary | ICD-10-CM | POA: Diagnosis not present

## 2023-05-27 DIAGNOSIS — F0390 Unspecified dementia without behavioral disturbance: Secondary | ICD-10-CM | POA: Insufficient documentation

## 2023-05-27 DIAGNOSIS — Z7189 Other specified counseling: Secondary | ICD-10-CM | POA: Diagnosis not present

## 2023-05-27 DIAGNOSIS — I700 Atherosclerosis of aorta: Secondary | ICD-10-CM | POA: Diagnosis not present

## 2023-05-27 DIAGNOSIS — D631 Anemia in chronic kidney disease: Secondary | ICD-10-CM | POA: Diagnosis present

## 2023-05-27 DIAGNOSIS — D649 Anemia, unspecified: Secondary | ICD-10-CM | POA: Insufficient documentation

## 2023-05-27 DIAGNOSIS — D500 Iron deficiency anemia secondary to blood loss (chronic): Secondary | ICD-10-CM | POA: Diagnosis present

## 2023-05-27 DIAGNOSIS — E43 Unspecified severe protein-calorie malnutrition: Secondary | ICD-10-CM | POA: Diagnosis present

## 2023-05-27 DIAGNOSIS — N390 Urinary tract infection, site not specified: Secondary | ICD-10-CM | POA: Diagnosis not present

## 2023-05-27 DIAGNOSIS — I672 Cerebral atherosclerosis: Secondary | ICD-10-CM | POA: Diagnosis not present

## 2023-05-27 DIAGNOSIS — R4182 Altered mental status, unspecified: Secondary | ICD-10-CM | POA: Diagnosis not present

## 2023-05-27 DIAGNOSIS — F03C0 Unspecified dementia, severe, without behavioral disturbance, psychotic disturbance, mood disturbance, and anxiety: Secondary | ICD-10-CM

## 2023-05-27 DIAGNOSIS — J9601 Acute respiratory failure with hypoxia: Secondary | ICD-10-CM | POA: Diagnosis present

## 2023-05-27 DIAGNOSIS — R0689 Other abnormalities of breathing: Secondary | ICD-10-CM | POA: Diagnosis not present

## 2023-05-27 DIAGNOSIS — A4151 Sepsis due to Escherichia coli [E. coli]: Secondary | ICD-10-CM | POA: Diagnosis present

## 2023-05-27 DIAGNOSIS — Y95 Nosocomial condition: Secondary | ICD-10-CM | POA: Diagnosis present

## 2023-05-27 DIAGNOSIS — G9340 Encephalopathy, unspecified: Secondary | ICD-10-CM | POA: Diagnosis not present

## 2023-05-27 DIAGNOSIS — R0989 Other specified symptoms and signs involving the circulatory and respiratory systems: Secondary | ICD-10-CM | POA: Diagnosis not present

## 2023-05-27 DIAGNOSIS — Z515 Encounter for palliative care: Secondary | ICD-10-CM | POA: Diagnosis not present

## 2023-05-27 DIAGNOSIS — R1111 Vomiting without nausea: Secondary | ICD-10-CM | POA: Diagnosis not present

## 2023-05-27 DIAGNOSIS — K920 Hematemesis: Secondary | ICD-10-CM | POA: Diagnosis not present

## 2023-05-27 DIAGNOSIS — I4891 Unspecified atrial fibrillation: Secondary | ICD-10-CM | POA: Diagnosis present

## 2023-05-27 DIAGNOSIS — N3289 Other specified disorders of bladder: Secondary | ICD-10-CM | POA: Diagnosis not present

## 2023-05-27 DIAGNOSIS — K7689 Other specified diseases of liver: Secondary | ICD-10-CM | POA: Diagnosis present

## 2023-05-27 DIAGNOSIS — Z7401 Bed confinement status: Secondary | ICD-10-CM | POA: Diagnosis not present

## 2023-05-27 DIAGNOSIS — R55 Syncope and collapse: Secondary | ICD-10-CM | POA: Diagnosis not present

## 2023-05-27 DIAGNOSIS — I2510 Atherosclerotic heart disease of native coronary artery without angina pectoris: Secondary | ICD-10-CM | POA: Diagnosis present

## 2023-05-27 DIAGNOSIS — N3000 Acute cystitis without hematuria: Secondary | ICD-10-CM | POA: Diagnosis present

## 2023-05-27 DIAGNOSIS — Z66 Do not resuscitate: Secondary | ICD-10-CM | POA: Diagnosis present

## 2023-05-27 DIAGNOSIS — I10 Essential (primary) hypertension: Secondary | ICD-10-CM | POA: Diagnosis not present

## 2023-05-27 DIAGNOSIS — J90 Pleural effusion, not elsewhere classified: Secondary | ICD-10-CM | POA: Diagnosis not present

## 2023-05-27 DIAGNOSIS — E872 Acidosis, unspecified: Secondary | ICD-10-CM | POA: Diagnosis present

## 2023-05-27 DIAGNOSIS — R0902 Hypoxemia: Secondary | ICD-10-CM | POA: Diagnosis not present

## 2023-05-27 DIAGNOSIS — I5032 Chronic diastolic (congestive) heart failure: Secondary | ICD-10-CM | POA: Diagnosis present

## 2023-05-27 DIAGNOSIS — I6782 Cerebral ischemia: Secondary | ICD-10-CM | POA: Diagnosis not present

## 2023-05-27 DIAGNOSIS — A419 Sepsis, unspecified organism: Secondary | ICD-10-CM | POA: Diagnosis not present

## 2023-05-27 DIAGNOSIS — J189 Pneumonia, unspecified organism: Secondary | ICD-10-CM | POA: Diagnosis present

## 2023-05-27 DIAGNOSIS — Z1152 Encounter for screening for COVID-19: Secondary | ICD-10-CM | POA: Diagnosis not present

## 2023-05-27 DIAGNOSIS — F03C3 Unspecified dementia, severe, with mood disturbance: Secondary | ICD-10-CM | POA: Diagnosis present

## 2023-05-27 DIAGNOSIS — N179 Acute kidney failure, unspecified: Secondary | ICD-10-CM | POA: Diagnosis present

## 2023-05-27 DIAGNOSIS — J9602 Acute respiratory failure with hypercapnia: Secondary | ICD-10-CM | POA: Diagnosis present

## 2023-05-27 DIAGNOSIS — G9341 Metabolic encephalopathy: Secondary | ICD-10-CM | POA: Diagnosis present

## 2023-05-27 DIAGNOSIS — E8809 Other disorders of plasma-protein metabolism, not elsewhere classified: Secondary | ICD-10-CM | POA: Diagnosis present

## 2023-05-27 DIAGNOSIS — I130 Hypertensive heart and chronic kidney disease with heart failure and stage 1 through stage 4 chronic kidney disease, or unspecified chronic kidney disease: Secondary | ICD-10-CM | POA: Diagnosis present

## 2023-05-27 DIAGNOSIS — Z1611 Resistance to penicillins: Secondary | ICD-10-CM | POA: Diagnosis present

## 2023-05-27 DIAGNOSIS — R001 Bradycardia, unspecified: Secondary | ICD-10-CM | POA: Diagnosis not present

## 2023-05-27 DIAGNOSIS — R6521 Severe sepsis with septic shock: Secondary | ICD-10-CM | POA: Diagnosis present

## 2023-05-27 DIAGNOSIS — R000 Tachycardia, unspecified: Secondary | ICD-10-CM | POA: Diagnosis not present

## 2023-05-27 DIAGNOSIS — N1832 Chronic kidney disease, stage 3b: Secondary | ICD-10-CM | POA: Diagnosis present

## 2023-05-27 DIAGNOSIS — Z4682 Encounter for fitting and adjustment of non-vascular catheter: Secondary | ICD-10-CM | POA: Diagnosis not present

## 2023-05-27 LAB — COMPREHENSIVE METABOLIC PANEL
ALT: 15 U/L (ref 0–44)
AST: 18 U/L (ref 15–41)
Albumin: 3.1 g/dL — ABNORMAL LOW (ref 3.5–5.0)
Alkaline Phosphatase: 68 U/L (ref 38–126)
Anion gap: 8 (ref 5–15)
BUN: 28 mg/dL — ABNORMAL HIGH (ref 8–23)
CO2: 23 mmol/L (ref 22–32)
Calcium: 8.1 mg/dL — ABNORMAL LOW (ref 8.9–10.3)
Chloride: 104 mmol/L (ref 98–111)
Creatinine, Ser: 1.71 mg/dL — ABNORMAL HIGH (ref 0.61–1.24)
GFR, Estimated: 37 mL/min — ABNORMAL LOW (ref >60)
Glucose, Bld: 95 mg/dL (ref 70–99)
Potassium: 4.3 mmol/L (ref 3.5–5.1)
Sodium: 135 mmol/L (ref 135–145)
Total Bilirubin: 0.5 mg/dL (ref 0.3–1.2)
Total Protein: 5.7 g/dL — ABNORMAL LOW (ref 6.5–8.1)

## 2023-05-27 LAB — CBC
HCT: 21.6 % — ABNORMAL LOW (ref 39.0–52.0)
Hemoglobin: 7.1 g/dL — ABNORMAL LOW (ref 13.0–17.0)
MCH: 33 pg (ref 26.0–34.0)
MCHC: 32.9 g/dL (ref 30.0–36.0)
MCV: 100.5 fL — ABNORMAL HIGH (ref 80.0–100.0)
Platelets: 502 10*3/uL — ABNORMAL HIGH (ref 150–400)
RBC: 2.15 MIL/uL — ABNORMAL LOW (ref 4.22–5.81)
RDW: 15.9 % — ABNORMAL HIGH (ref 11.5–15.5)
WBC: 9.1 10*3/uL (ref 4.0–10.5)
nRBC: 0 % (ref 0.0–0.2)

## 2023-05-27 LAB — TYPE AND SCREEN
ABO/RH(D): A POS
Antibody Screen: NEGATIVE

## 2023-05-27 NOTE — ED Provider Notes (Signed)
[facility] Provider Note    Event Date/Time   First MD Initiated Contact with Patient 05/27/23 [phone]     (approximate)   History   Chief Complaint: Hematemesis   HPI  Charles Sims is a 89 y.o. male with a history of dementia sent to the ED from his nursing home due to suspected blood and an episode of emesis.  No other acute complaints.  Patient is not able to provide any history due to his advanced dementia.     Physical Exam   Triage Vital Signs: ED Triage Vitals  Encounter Vitals Group     BP 05/27/23 0110 95/63     Systolic BP Percentile --      Diastolic BP Percentile --      Pulse Rate 05/27/23 0110 65     Resp 05/27/23 0110 16     Temp 05/27/23 0110 99.6 F (37.6 C)     Temp src --      SpO2 05/27/23 0110 95 %     Weight 05/27/23 0104 149 lb 14.6 oz (68 kg)     Height 05/27/23 0104 5\' 10"  (1.778 m)     Head Circumference --      Peak Flow --      Pain Score 05/27/23 0104 0     Pain Loc --      Pain Education --      Exclude from Growth Chart --     Most recent vital signs: Vitals:   05/27/23 0110 05/27/23 0230  BP: 95/63   Pulse: 65 77  Resp: 16 18  Temp: 99.6 F (37.6 C)   SpO2: 95% 92%    General: Awake, no distress. disoriented CV:  Good peripheral perfusion. RRR Resp:  Normal effort. ctab Abd:  No distention. Soft nt. Rectal = brown stool, HO neg. Other:  MMM   ED Results / Procedures / Treatments   Labs (all labs ordered are listed, but only abnormal results are displayed) Labs Reviewed  COMPREHENSIVE METABOLIC PANEL - Abnormal; Notable for the following components:      Result Value   BUN 28 (*)    Creatinine, Ser 1.71 (*)    Calcium 8.1 (*)    Total Protein 5.7 (*)    Albumin 3.1 (*)    GFR, Estimated 37 (*)    All other components within normal limits  CBC - Abnormal; Notable for the following components:   RBC 2.15 (*)    Hemoglobin 7.1 (*)    HCT 21.6 (*)    MCV 100.5 (*)    RDW 15.9 (*)     Platelets 502 (*)    All other components within normal limits  TYPE AND SCREEN     EKG    RADIOLOGY    PROCEDURES:  Procedures   MEDICATIONS ORDERED IN ED: Medications - No data to display   IMPRESSION / MDM / ASSESSMENT AND PLAN / ED COURSE  I reviewed the triage vital signs and the nursing notes.  DDx: Anemia, AKI, electrolyte abnormality, GI bleed  Patient's presentation is most consistent with acute presentation with potential threat to life or bodily function.  Patient sent to the ED for evaluation of suspected hematemesis.  Labs are all baseline including CKD, chronic anemia.  Rectal exam negative for occult blood or melena.  No emesis in the ED, vital signs are normal.  Stable for discharge.       FINAL CLINICAL IMPRESSION(S) / ED DIAGNOSES  Final diagnoses:  Chronic anemia  Severe dementia, unspecified dementia type, unspecified whether behavioral, psychotic, or mood disturbance or anxiety (HCC)     Rx / DC Orders   ED Discharge Orders     None        Note:  This document was prepared using Dragon voice recognition software and may include unintentional dictation errors.   Sharman Cheek, MD 05/27/23 0300

## 2023-05-27 NOTE — Discharge Instructions (Signed)
Your lab tests today were all stable from previous levels.  Your exam did not show any evidence of GI bleeding.

## 2023-05-27 NOTE — ED Triage Notes (Signed)
Pt to ed from [facility] via [facility]. Pt had one episode of emesis with some blood in it and staff woke the pt up now to have him evaluated for it. EMS advised he was asleep on his couch when they arrived. Pt states he feels fine and has no complaints at this time.   95/70 95% 70HR DNR NOT sent with pt.

## 2023-05-28 ENCOUNTER — Emergency Department: Payer: Medicare Other

## 2023-05-28 ENCOUNTER — Other Ambulatory Visit: Payer: Self-pay

## 2023-05-28 ENCOUNTER — Encounter: Payer: Self-pay | Admitting: Emergency Medicine

## 2023-05-28 ENCOUNTER — Inpatient Hospital Stay: Payer: Medicare Other

## 2023-05-28 ENCOUNTER — Inpatient Hospital Stay
Admission: EM | Admit: 2023-05-28 | Discharge: 2023-06-04 | DRG: 871 | Disposition: A | Discharge status: Skilled Nursing Facility | Payer: Medicare Other | Source: Skilled Nursing Facility | Attending: Internal Medicine | Admitting: Internal Medicine

## 2023-05-28 DIAGNOSIS — E872 Acidosis, unspecified: Secondary | ICD-10-CM | POA: Diagnosis present

## 2023-05-28 DIAGNOSIS — N179 Acute kidney failure, unspecified: Secondary | ICD-10-CM | POA: Diagnosis present

## 2023-05-28 DIAGNOSIS — J9601 Acute respiratory failure with hypoxia: Secondary | ICD-10-CM | POA: Diagnosis present

## 2023-05-28 DIAGNOSIS — R0989 Other specified symptoms and signs involving the circulatory and respiratory systems: Secondary | ICD-10-CM | POA: Diagnosis not present

## 2023-05-28 DIAGNOSIS — R001 Bradycardia, unspecified: Secondary | ICD-10-CM | POA: Diagnosis present

## 2023-05-28 DIAGNOSIS — I700 Atherosclerosis of aorta: Secondary | ICD-10-CM | POA: Diagnosis not present

## 2023-05-28 DIAGNOSIS — A419 Sepsis, unspecified organism: Secondary | ICD-10-CM

## 2023-05-28 DIAGNOSIS — I4891 Unspecified atrial fibrillation: Secondary | ICD-10-CM | POA: Diagnosis present

## 2023-05-28 DIAGNOSIS — N3289 Other specified disorders of bladder: Secondary | ICD-10-CM | POA: Diagnosis not present

## 2023-05-28 DIAGNOSIS — I10 Essential (primary) hypertension: Secondary | ICD-10-CM | POA: Diagnosis not present

## 2023-05-28 DIAGNOSIS — N1832 Chronic kidney disease, stage 3b: Secondary | ICD-10-CM | POA: Diagnosis present

## 2023-05-28 DIAGNOSIS — Z1611 Resistance to penicillins: Secondary | ICD-10-CM | POA: Diagnosis present

## 2023-05-28 DIAGNOSIS — Z7982 Long term (current) use of aspirin: Secondary | ICD-10-CM

## 2023-05-28 DIAGNOSIS — R55 Syncope and collapse: Secondary | ICD-10-CM | POA: Diagnosis not present

## 2023-05-28 DIAGNOSIS — R918 Other nonspecific abnormal finding of lung field: Secondary | ICD-10-CM | POA: Diagnosis not present

## 2023-05-28 DIAGNOSIS — J189 Pneumonia, unspecified organism: Secondary | ICD-10-CM | POA: Diagnosis present

## 2023-05-28 DIAGNOSIS — Z8051 Family history of malignant neoplasm of kidney: Secondary | ICD-10-CM

## 2023-05-28 DIAGNOSIS — Z1152 Encounter for screening for COVID-19: Secondary | ICD-10-CM | POA: Diagnosis not present

## 2023-05-28 DIAGNOSIS — E43 Unspecified severe protein-calorie malnutrition: Secondary | ICD-10-CM | POA: Diagnosis present

## 2023-05-28 DIAGNOSIS — D500 Iron deficiency anemia secondary to blood loss (chronic): Secondary | ICD-10-CM | POA: Diagnosis not present

## 2023-05-28 DIAGNOSIS — J9602 Acute respiratory failure with hypercapnia: Secondary | ICD-10-CM | POA: Diagnosis present

## 2023-05-28 DIAGNOSIS — K7689 Other specified diseases of liver: Secondary | ICD-10-CM | POA: Diagnosis present

## 2023-05-28 DIAGNOSIS — Z820 Family history of epilepsy and other diseases of the nervous system: Secondary | ICD-10-CM

## 2023-05-28 DIAGNOSIS — I5032 Chronic diastolic (congestive) heart failure: Secondary | ICD-10-CM | POA: Diagnosis not present

## 2023-05-28 DIAGNOSIS — R0902 Hypoxemia: Secondary | ICD-10-CM | POA: Diagnosis not present

## 2023-05-28 DIAGNOSIS — Z4682 Encounter for fitting and adjustment of non-vascular catheter: Secondary | ICD-10-CM | POA: Diagnosis not present

## 2023-05-28 DIAGNOSIS — R6521 Severe sepsis with septic shock: Secondary | ICD-10-CM | POA: Diagnosis not present

## 2023-05-28 DIAGNOSIS — G9340 Encephalopathy, unspecified: Secondary | ICD-10-CM | POA: Diagnosis not present

## 2023-05-28 DIAGNOSIS — N3000 Acute cystitis without hematuria: Secondary | ICD-10-CM | POA: Diagnosis not present

## 2023-05-28 DIAGNOSIS — N390 Urinary tract infection, site not specified: Secondary | ICD-10-CM | POA: Diagnosis not present

## 2023-05-28 DIAGNOSIS — Z888 Allergy status to other drugs, medicaments and biological substances status: Secondary | ICD-10-CM

## 2023-05-28 DIAGNOSIS — D631 Anemia in chronic kidney disease: Secondary | ICD-10-CM | POA: Diagnosis present

## 2023-05-28 DIAGNOSIS — I130 Hypertensive heart and chronic kidney disease with heart failure and stage 1 through stage 4 chronic kidney disease, or unspecified chronic kidney disease: Secondary | ICD-10-CM | POA: Diagnosis present

## 2023-05-28 DIAGNOSIS — A4151 Sepsis due to Escherichia coli [E. coli]: Secondary | ICD-10-CM | POA: Diagnosis not present

## 2023-05-28 DIAGNOSIS — Z7189 Other specified counseling: Secondary | ICD-10-CM | POA: Diagnosis not present

## 2023-05-28 DIAGNOSIS — Z515 Encounter for palliative care: Secondary | ICD-10-CM

## 2023-05-28 DIAGNOSIS — Y95 Nosocomial condition: Secondary | ICD-10-CM | POA: Diagnosis present

## 2023-05-28 DIAGNOSIS — Z7401 Bed confinement status: Secondary | ICD-10-CM | POA: Diagnosis not present

## 2023-05-28 DIAGNOSIS — R0689 Other abnormalities of breathing: Secondary | ICD-10-CM | POA: Diagnosis not present

## 2023-05-28 DIAGNOSIS — E8809 Other disorders of plasma-protein metabolism, not elsewhere classified: Secondary | ICD-10-CM | POA: Diagnosis present

## 2023-05-28 DIAGNOSIS — E785 Hyperlipidemia, unspecified: Secondary | ICD-10-CM | POA: Diagnosis not present

## 2023-05-28 DIAGNOSIS — Z66 Do not resuscitate: Secondary | ICD-10-CM | POA: Diagnosis present

## 2023-05-28 DIAGNOSIS — I2510 Atherosclerotic heart disease of native coronary artery without angina pectoris: Secondary | ICD-10-CM | POA: Diagnosis present

## 2023-05-28 DIAGNOSIS — J90 Pleural effusion, not elsewhere classified: Secondary | ICD-10-CM | POA: Diagnosis not present

## 2023-05-28 DIAGNOSIS — Z87891 Personal history of nicotine dependence: Secondary | ICD-10-CM

## 2023-05-28 DIAGNOSIS — I672 Cerebral atherosclerosis: Secondary | ICD-10-CM | POA: Diagnosis not present

## 2023-05-28 DIAGNOSIS — G9341 Metabolic encephalopathy: Secondary | ICD-10-CM | POA: Diagnosis not present

## 2023-05-28 DIAGNOSIS — Z79899 Other long term (current) drug therapy: Secondary | ICD-10-CM

## 2023-05-28 DIAGNOSIS — Z8673 Personal history of transient ischemic attack (TIA), and cerebral infarction without residual deficits: Secondary | ICD-10-CM

## 2023-05-28 DIAGNOSIS — I6782 Cerebral ischemia: Secondary | ICD-10-CM | POA: Diagnosis not present

## 2023-05-28 DIAGNOSIS — K219 Gastro-esophageal reflux disease without esophagitis: Secondary | ICD-10-CM | POA: Diagnosis present

## 2023-05-28 DIAGNOSIS — F03C3 Unspecified dementia, severe, with mood disturbance: Secondary | ICD-10-CM | POA: Diagnosis present

## 2023-05-28 DIAGNOSIS — D649 Anemia, unspecified: Secondary | ICD-10-CM | POA: Diagnosis not present

## 2023-05-28 DIAGNOSIS — R4182 Altered mental status, unspecified: Secondary | ICD-10-CM | POA: Diagnosis not present

## 2023-05-28 DIAGNOSIS — F32A Depression, unspecified: Secondary | ICD-10-CM | POA: Diagnosis present

## 2023-05-28 DIAGNOSIS — Z6820 Body mass index (BMI) 20.0-20.9, adult: Secondary | ICD-10-CM

## 2023-05-28 DIAGNOSIS — N401 Enlarged prostate with lower urinary tract symptoms: Secondary | ICD-10-CM | POA: Diagnosis present

## 2023-05-28 LAB — COMPREHENSIVE METABOLIC PANEL
ALT: 14 U/L (ref 0–44)
AST: 31 U/L (ref 15–41)
Albumin: 2.8 g/dL — ABNORMAL LOW (ref 3.5–5.0)
Alkaline Phosphatase: 99 U/L (ref 38–126)
Anion gap: 13 (ref 5–15)
BUN: 36 mg/dL — ABNORMAL HIGH (ref 8–23)
CO2: 20 mmol/L — ABNORMAL LOW (ref 22–32)
Calcium: 7.9 mg/dL — ABNORMAL LOW (ref 8.9–10.3)
Chloride: 104 mmol/L (ref 98–111)
Creatinine, Ser: 2.2 mg/dL — ABNORMAL HIGH (ref 0.61–1.24)
GFR, Estimated: 28 mL/min — ABNORMAL LOW (ref >60)
Glucose, Bld: 78 mg/dL (ref 70–99)
Potassium: 4.7 mmol/L (ref 3.5–5.1)
Sodium: 137 mmol/L (ref 135–145)
Total Bilirubin: 0.8 mg/dL (ref 0.3–1.2)
Total Protein: 5.3 g/dL — ABNORMAL LOW (ref 6.5–8.1)

## 2023-05-28 LAB — CSF CELL COUNT WITH DIFFERENTIAL
Eosinophils, CSF: 0 %
Eosinophils, CSF: 0 %
Lymphs, CSF: 59 %
Lymphs, CSF: 64 %
Monocyte-Macrophage-Spinal Fluid: 0 %
Monocyte-Macrophage-Spinal Fluid: 12 %
RBC Count, CSF: 144 /mm3 — ABNORMAL HIGH (ref 0–3)
RBC Count, CSF: 17 /mm3 — ABNORMAL HIGH (ref 0–3)
Segmented Neutrophils-CSF: 29 %
Segmented Neutrophils-CSF: 36 %
Tube #: 1
Tube #: 4
WBC, CSF: 2 /mm3 (ref 0–5)
WBC, CSF: 5 /mm3 (ref 0–5)

## 2023-05-28 LAB — TYPE AND SCREEN

## 2023-05-28 LAB — CBC WITH DIFFERENTIAL/PLATELET
Abs Immature Granulocytes: 0.02 10*3/uL (ref 0.00–0.07)
Abs Immature Granulocytes: 0.07 10*3/uL (ref 0.00–0.07)
Basophils Absolute: 0 10*3/uL (ref 0.0–0.1)
Basophils Absolute: 0 10*3/uL (ref 0.0–0.1)
Basophils Relative: 0 %
Basophils Relative: 0 %
Eosinophils Absolute: 0 10*3/uL (ref 0.0–0.5)
Eosinophils Absolute: 0 10*3/uL (ref 0.0–0.5)
Eosinophils Relative: 0 %
Eosinophils Relative: 0 %
HCT: 18.7 % — ABNORMAL LOW (ref 39.0–52.0)
HCT: 20.7 % — ABNORMAL LOW (ref 39.0–52.0)
Hemoglobin: 6.4 g/dL — ABNORMAL LOW (ref 13.0–17.0)
Hemoglobin: 6.6 g/dL — ABNORMAL LOW (ref 13.0–17.0)
Immature Granulocytes: 1 %
Immature Granulocytes: 2 %
Lymphocytes Relative: 29 %
Lymphocytes Relative: 30 %
Lymphs Abs: 1 10*3/uL (ref 0.7–4.0)
Lymphs Abs: 1 10*3/uL (ref 0.7–4.0)
MCH: 33.5 pg (ref 26.0–34.0)
MCH: 33.5 pg (ref 26.0–34.0)
MCHC: 31.9 g/dL (ref 30.0–36.0)
MCHC: 34.2 g/dL (ref 30.0–36.0)
MCV: 105.1 fL — ABNORMAL HIGH (ref 80.0–100.0)
MCV: 97.9 fL (ref 80.0–100.0)
Monocytes Absolute: 0 10*3/uL — ABNORMAL LOW (ref 0.1–1.0)
Monocytes Absolute: 0.1 10*3/uL (ref 0.1–1.0)
Monocytes Relative: 1 %
Monocytes Relative: 2 %
Neutro Abs: 2.2 10*3/uL (ref 1.7–7.7)
Neutro Abs: 2.3 10*3/uL (ref 1.7–7.7)
Neutrophils Relative %: 67 %
Neutrophils Relative %: 68 %
Platelets: 321 10*3/uL (ref 150–400)
Platelets: 335 10*3/uL (ref 150–400)
RBC: 1.91 MIL/uL — ABNORMAL LOW (ref 4.22–5.81)
RBC: 1.97 MIL/uL — ABNORMAL LOW (ref 4.22–5.81)
RDW: 15.7 % — ABNORMAL HIGH (ref 11.5–15.5)
RDW: 15.9 % — ABNORMAL HIGH (ref 11.5–15.5)
WBC: 3.2 10*3/uL — ABNORMAL LOW (ref 4.0–10.5)
WBC: 3.5 10*3/uL — ABNORMAL LOW (ref 4.0–10.5)
nRBC: 0 % (ref 0.0–0.2)
nRBC: 0 % (ref 0.0–0.2)

## 2023-05-28 LAB — URINALYSIS, W/ REFLEX TO CULTURE (INFECTION SUSPECTED)
Bilirubin Urine: NEGATIVE
Glucose, UA: NEGATIVE mg/dL
Ketones, ur: NEGATIVE mg/dL
Nitrite: NEGATIVE
Protein, ur: 100 mg/dL — AB
Specific Gravity, Urine: 1.012 (ref 1.005–1.030)
WBC, UA: >50 WBC/hpf (ref 0–5)
pH: 5 (ref 5.0–8.0)

## 2023-05-28 LAB — CSF CULTURE W GRAM STAIN

## 2023-05-28 LAB — BLOOD GAS, ARTERIAL
Acid-base deficit: 3.7 mmol/L — ABNORMAL HIGH (ref 0.0–2.0)
Bicarbonate: 19.5 mmol/L — ABNORMAL LOW (ref 20.0–28.0)
FIO2: 40 %
MECHVT: 450 mL
Mechanical Rate: 18
O2 Saturation: 99.7 %
PEEP: 5 cmH2O
Patient temperature: 37
pCO2 arterial: 28 mmHg — ABNORMAL LOW (ref 32–48)
pH, Arterial: 7.45 (ref 7.35–7.45)
pO2, Arterial: 132 mmHg — ABNORMAL HIGH (ref 83–108)

## 2023-05-28 LAB — PROTIME-INR
INR: 1.3 — ABNORMAL HIGH (ref 0.8–1.2)
Prothrombin Time: 16.8 seconds — ABNORMAL HIGH (ref 11.4–15.2)

## 2023-05-28 LAB — LACTIC ACID, PLASMA
Lactic Acid, Venous: 7.7 mmol/L (ref 0.5–1.9)
Lactic Acid, Venous: 8.3 mmol/L (ref 0.5–1.9)
Lactic Acid, Venous: 5.5 mmol/L (ref 0.5–1.9)

## 2023-05-28 LAB — RESP PANEL BY RT-PCR (RSV, FLU A&B, COVID)  RVPGX2
Influenza A by PCR: NEGATIVE
Influenza B by PCR: NEGATIVE
Resp Syncytial Virus by PCR: NEGATIVE
SARS Coronavirus 2 by RT PCR: NEGATIVE

## 2023-05-28 LAB — PROTEIN AND GLUCOSE, CSF
Glucose, CSF: 69 mg/dL (ref 40–70)
Total  Protein, CSF: 29 mg/dL (ref 15–45)

## 2023-05-28 LAB — PREPARE RBC (CROSSMATCH)

## 2023-05-28 LAB — GLUCOSE, CAPILLARY: Glucose-Capillary: 115 mg/dL — ABNORMAL HIGH (ref 70–99)

## 2023-05-28 LAB — MAGNESIUM: Magnesium: 1.5 mg/dL — ABNORMAL LOW (ref 1.7–2.4)

## 2023-05-28 LAB — BPAM RBC: ISSUE DATE / TIME: 202407232317

## 2023-05-28 LAB — PHOSPHORUS: Phosphorus: 3 mg/dL (ref 2.5–4.6)

## 2023-05-28 LAB — PROCALCITONIN: Procalcitonin: 23.22 ng/mL

## 2023-05-28 MED ORDER — SODIUM BICARBONATE 8.4 % IV SOLN
INTRAVENOUS | Status: DC
  Filled 2023-05-28: qty 150

## 2023-05-28 MED ORDER — BISACODYL 10 MG RE SUPP
10.0000 mg | Freq: Once | RECTAL | Status: AC
  Administered 2023-05-28: 10 mg via RECTAL
  Filled 2023-05-28: qty 1

## 2023-05-28 MED ORDER — METRONIDAZOLE 500 MG/100ML IV SOLN
500.0000 mg | Freq: Once | INTRAVENOUS | Status: AC
  Administered 2023-05-28: 500 mg via INTRAVENOUS
  Filled 2023-05-28: qty 100

## 2023-05-28 MED ORDER — DOCUSATE SODIUM 50 MG/5ML PO LIQD: 100.0000 mg | Freq: Two times a day (BID) | ORAL | Status: DC | PRN

## 2023-05-28 MED ORDER — SODIUM CHLORIDE 0.9 % IV SOLN: 250.0000 mL | INTRAVENOUS | Status: DC

## 2023-05-28 MED ORDER — SODIUM CHLORIDE 0.9 % IV SOLN
2.0000 g | Freq: Once | INTRAVENOUS | Status: AC
  Administered 2023-05-28: 2 g via INTRAVENOUS
  Filled 2023-05-28: qty 12.5

## 2023-05-28 MED ORDER — MIDAZOLAM HCL 2 MG/2ML IJ SOLN: 2.0000 mg | Freq: Once | INTRAMUSCULAR | Status: AC

## 2023-05-28 MED ORDER — IOHEXOL 350 MG/ML SOLN
75.0000 mL | Freq: Once | INTRAVENOUS | Status: AC | PRN
  Administered 2023-05-28: 75 mL via INTRAVENOUS

## 2023-05-28 MED ORDER — ETOMIDATE 2 MG/ML IV SOLN
0.3000 mg/kg | Freq: Once | INTRAVENOUS | Status: DC
  Filled 2023-05-28: qty 20

## 2023-05-28 MED ORDER — ACETAMINOPHEN 325 MG PO TABS: 650.0000 mg | ORAL_TABLET | Freq: Four times a day (QID) | ORAL | Status: DC | PRN

## 2023-05-28 MED ORDER — HEPARIN SODIUM (PORCINE) 5000 UNIT/ML IJ SOLN: 5000.0000 [IU] | Freq: Three times a day (TID) | INTRAMUSCULAR | Status: DC

## 2023-05-28 MED ORDER — NOREPINEPHRINE 4 MG/250ML-% IV SOLN
2.0000 ug/min | INTRAVENOUS | Status: DC
  Administered 2023-05-28: 9 ug/min via INTRAVENOUS
  Administered 2023-05-29 (×3): 10 ug/min via INTRAVENOUS
  Administered 2023-05-30: 7 ug/min via INTRAVENOUS
  Administered 2023-05-30: 9 ug/min via INTRAVENOUS
  Filled 2023-05-28 (×6): qty 250

## 2023-05-28 MED ORDER — SODIUM CHLORIDE 0.9 % IV SOLN
10.0000 mL/h | Freq: Once | INTRAVENOUS | Status: AC
  Administered 2023-05-28: 10 mL/h via INTRAVENOUS

## 2023-05-28 MED ORDER — ACETAMINOPHEN 325 MG RE SUPP
650.0000 mg | Freq: Once | RECTAL | Status: AC
  Administered 2023-05-28: 650 mg via RECTAL
  Filled 2023-05-28: qty 2

## 2023-05-28 MED ORDER — STERILE WATER FOR INJECTION IJ SOLN
INTRAMUSCULAR | Status: AC
  Administered 2023-05-28: 10 mL
  Filled 2023-05-28: qty 10

## 2023-05-28 MED ORDER — DEXAMETHASONE SODIUM PHOSPHATE 10 MG/ML IJ SOLN
10.0000 mg | Freq: Once | INTRAMUSCULAR | Status: AC
  Administered 2023-05-28: 10 mg via INTRAVENOUS
  Filled 2023-05-28: qty 1

## 2023-05-28 MED ORDER — VANCOMYCIN HCL 1500 MG/300ML IV SOLN
1500.0000 mg | Freq: Once | INTRAVENOUS | Status: AC
  Administered 2023-05-28: 1500 mg via INTRAVENOUS
  Filled 2023-05-28: qty 300

## 2023-05-28 MED ORDER — POLYETHYLENE GLYCOL 3350 17 G PO PACK
17.0000 g | PACK | Freq: Every day | ORAL | Status: DC
  Administered 2023-05-28: 17 g
  Filled 2023-05-28: qty 1

## 2023-05-28 MED ORDER — ROCURONIUM BROMIDE 10 MG/ML (PF) SYRINGE
70.0000 mg | PREFILLED_SYRINGE | Freq: Once | INTRAVENOUS | Status: AC
  Administered 2023-05-28: 70 mg via INTRAVENOUS
  Filled 2023-05-28: qty 7

## 2023-05-28 MED ORDER — FENTANYL CITRATE PF 50 MCG/ML IJ SOSY
25.0000 ug | PREFILLED_SYRINGE | INTRAMUSCULAR | Status: DC | PRN
  Administered 2023-05-29 (×3): 50 ug via INTRAVENOUS
  Administered 2023-05-29: 25 ug via INTRAVENOUS
  Administered 2023-05-29 (×2): 50 ug via INTRAVENOUS
  Administered 2023-05-29: 100 ug via INTRAVENOUS
  Administered 2023-05-30 (×2): 50 ug via INTRAVENOUS
  Filled 2023-05-28 (×6): qty 1
  Filled 2023-05-28: qty 2
  Filled 2023-05-28 (×2): qty 1

## 2023-05-28 MED ORDER — MAGNESIUM SULFATE 2 GM/50ML IV SOLN
2.0000 g | Freq: Once | INTRAVENOUS | Status: AC
  Administered 2023-05-29: 2 g via INTRAVENOUS
  Filled 2023-05-28: qty 50

## 2023-05-28 MED ORDER — LACTATED RINGERS IV BOLUS (SEPSIS)
1000.0000 mL | Freq: Once | INTRAVENOUS | Status: AC
  Administered 2023-05-28: 1000 mL via INTRAVENOUS

## 2023-05-28 MED ORDER — VANCOMYCIN HCL IN DEXTROSE 1-5 GM/200ML-% IV SOLN
1000.0000 mg | Freq: Once | INTRAVENOUS | Status: DC
  Filled 2023-05-28: qty 200

## 2023-05-28 MED ORDER — FLUOXETINE HCL 20 MG PO CAPS
20.0000 mg | ORAL_CAPSULE | Freq: Every day | ORAL | Status: DC
  Administered 2023-05-29 – 2023-05-30 (×2): 20 mg
  Filled 2023-05-28 (×4): qty 1

## 2023-05-28 MED ORDER — CHLORHEXIDINE GLUCONATE CLOTH 2 % EX PADS
6.0000 | MEDICATED_PAD | Freq: Every day | CUTANEOUS | Status: DC
  Administered 2023-05-28 – 2023-06-01 (×5): 6 via TOPICAL

## 2023-05-28 MED ORDER — PROPOFOL 1000 MG/100ML IV EMUL
5.0000 ug/kg/min | INTRAVENOUS | Status: DC
  Administered 2023-05-28: 5 ug/kg/min via INTRAVENOUS
  Filled 2023-05-28: qty 100

## 2023-05-28 MED ORDER — DOCUSATE SODIUM 50 MG/5ML PO LIQD
100.0000 mg | Freq: Two times a day (BID) | ORAL | Status: DC
  Administered 2023-05-28: 100 mg
  Filled 2023-05-28: qty 10

## 2023-05-28 MED ORDER — SODIUM CHLORIDE 0.9 % IV SOLN
2.0000 g | INTRAVENOUS | Status: DC
  Filled 2023-05-28: qty 12.5

## 2023-05-28 MED ORDER — ROCURONIUM BROMIDE 10 MG/ML (PF) SYRINGE
PREFILLED_SYRINGE | INTRAVENOUS | Status: AC
  Filled 2023-05-28: qty 10

## 2023-05-28 MED ORDER — ROCURONIUM BROMIDE 10 MG/ML (PF) SYRINGE: 1.0000 mg/kg | PREFILLED_SYRINGE | Freq: Once | INTRAVENOUS | Status: DC

## 2023-05-28 MED ORDER — ATORVASTATIN CALCIUM 20 MG PO TABS
80.0000 mg | ORAL_TABLET | Freq: Every day | ORAL | Status: DC
  Administered 2023-05-29 – 2023-05-30 (×2): 80 mg
  Filled 2023-05-28 (×4): qty 4

## 2023-05-28 MED ORDER — POLYETHYLENE GLYCOL 3350 17 G PO PACK: 17.0000 g | PACK | Freq: Every day | ORAL | Status: DC | PRN

## 2023-05-28 MED ORDER — ACETAMINOPHEN 650 MG RE SUPP: 650.0000 mg | Freq: Four times a day (QID) | RECTAL | Status: DC | PRN

## 2023-05-28 MED ORDER — FENTANYL CITRATE PF 50 MCG/ML IJ SOSY
25.0000 ug | PREFILLED_SYRINGE | INTRAMUSCULAR | Status: AC | PRN
  Administered 2023-05-28 – 2023-05-30 (×3): 25 ug via INTRAVENOUS
  Filled 2023-05-28 (×3): qty 1

## 2023-05-28 MED ORDER — LACTATED RINGERS IV BOLUS (SEPSIS)
250.0000 mL | Freq: Once | INTRAVENOUS | Status: AC
  Administered 2023-05-28: 250 mL via INTRAVENOUS

## 2023-05-28 MED ORDER — ETOMIDATE 2 MG/ML IV SOLN
20.0000 mg | Freq: Once | INTRAVENOUS | Status: AC
  Administered 2023-05-28: 20 mg via INTRAVENOUS

## 2023-05-28 MED ORDER — NOREPINEPHRINE 4 MG/250ML-% IV SOLN
INTRAVENOUS | Status: AC
  Administered 2023-05-28: 5 ug/min
  Filled 2023-05-28: qty 250

## 2023-05-28 MED ORDER — MIDAZOLAM HCL 2 MG/2ML IJ SOLN
INTRAMUSCULAR | Status: AC
  Administered 2023-05-28: 2 mg via INTRAVENOUS
  Filled 2023-05-28: qty 2

## 2023-05-28 MED ORDER — PANTOPRAZOLE SODIUM 40 MG IV SOLR
40.0000 mg | Freq: Two times a day (BID) | INTRAVENOUS | Status: DC
  Administered 2023-05-29 – 2023-06-01 (×7): 40 mg via INTRAVENOUS
  Filled 2023-05-28 (×7): qty 10

## 2023-05-28 MED ORDER — PANTOPRAZOLE SODIUM 40 MG IV SOLR
40.0000 mg | INTRAVENOUS | Status: DC
  Administered 2023-05-28: 40 mg via INTRAVENOUS
  Filled 2023-05-28: qty 10

## 2023-05-28 NOTE — Progress Notes (Signed)
eLink Physician-Brief Progress Note Patient Name: Charles Sims DOB: 10-10-1930 MRN: [id number]   Date of Service  05/28/2023  HPI/Events of Note  89 year old male with a history of essential hypertension, dyslipidemia, coronary artery disease with heart failure, stroke, CKD and A-fib not on anticoagulation with memory loss who presented with a fall and was discharged on 7/17.  Return to the emergency department 7/22 w/ hematemesis.  Patient is DNR but family opted to proceed with intubation.  Patient presented with anemia to 6.4, lactic acid of 8.3.  CT chest with rib fractures present, mild basilar airspace disease, and trace pleural effusions.  No obvious angiographic evidence of bleeding LP fairly unremarkable.  eICU Interventions  Type and screen with PRBC ordered.  Trend hemoglobin  Until etiology of bleeding is confirmed, consider increase PPI IV to twice daily  GI prophylaxis with pantoprazole DVT prophylaxis held     Intervention Category Evaluation Type: New Patient Evaluation  Dawson Hollman 05/28/2023, 10:40 PM

## 2023-05-28 NOTE — Consult Note (Signed)
PHARMACY -  BRIEF ANTIBIOTIC NOTE   Pharmacy has received consult(s) for vancomycin, cefepime from an ED provider.  The patient's profile has been reviewed for ht/wt/allergies/indication/available labs.    One time order(s) placed for vancomycin 1500 mg, cefepime 2g  Further antibiotics/pharmacy consults should be ordered by admitting physician if indicated.                       Thank you,  Celene Squibb, PharmD Clinical Pharmacist 05/28/2023 5:28 PM

## 2023-05-28 NOTE — ED Notes (Signed)
Dr. Rosalia Hammers, this RN, Gladys Damme, RN, and RT at bedside for intubation.

## 2023-05-28 NOTE — Progress Notes (Signed)
Pt transported to CT then ICU 20 on the vent without incident. Pt remains on the vent and is tol well at this time. Report given to ICU RT.

## 2023-05-28 NOTE — H&P (Signed)
NAME:  Charles Sims, MRN:  [id number], DOB:  06-15-1930, LOS: 0 ADMISSION DATE:  05/28/2023, CONSULTATION DATE:  05/28/23 REFERRING MD:  Dr. Rosalia Hammers, CHIEF COMPLAINT:  AMS  History of Present Illness:  89 yo M presenting to [facility] ED from [facility] via EMS for evaluation of AMS.  History provided per chart review and daughter, Charles Sims's, supplementation via telephone report as patient is intubated and unresponsive at this time.  Patient was recently discharged on 05/22/2023, at which point he was transferred to [facility] after PT/OT had recommended SNF placement.  This recent admission was after a fall and during admission he was noted to have a significant hemoglobin drop but family declined further GI workup due to overall decline.  At time of admission so reported baseline mentation to be ability to recognize family members, intermittent confusion- not oriented to place/time. Patient was also seen on 05/27/2023 in the ED due to concern of suspected hematemesis. On 7/22 Hgb was 7.1, no signs of bleeding noted, negative rectal exam.  At discharge, he was reportedly back to baseline then over the last several days (7/20-7/21), family reports mentation as becoming progressively worse. They deny any other complaints. No complaints of dyspnea, productive cough, urinary symptoms, falls, nausea/vomiting/diarrhea, chest pain per patient or SNF staff. Patient does have baseline tremors that have been worsening over the last few years. Per EMS the SNF staff were concerned feeling the patient's tremors had worsened as well as becoming progressively altered.  EMS reported difficulty obtaining in SpO2 reading but that he was 100% after being placed on a nonrebreather for transport.  ED course: Upon arrival patient GCS 3-4, febrile, hypotensive & tachypneic with what appeared to be rigors. Sepsis protocol initiated with empiric antibiotics & IVF resuscitation. Oxygenation stable but mentation unimproved while in  ED. Lengthy GOC discussion between EDP & daughter, Charles Sims. Patient confirmed to be DNR but while still wanting all other needed interventions including mechanical intubation and ventilatory support. Patient urgently intubated due to inability to protect airway. Case discussed with Neurology on call, patient not a candidate for continuous EEG monitoring as tremors felt to be rigors due to temperature. Initial CTH negative, CXR clear. Labs significant for leukopenia, anemia, AKI, mild NAGMA, hypoalbuminemia with significant lactic acidosis and UTI. Medications given: acetaminophen, decadron, etomidate/ versed/ rocuronium, cefepime/ flagyl/ vancomycin, 2.25 L IVF bolus Initial Vitals: 102.8/ 33/ 120/ 76/56 & 97% on RA Significant labs: (Labs/ Imaging personally reviewed) I, Cheryll Cockayne Rust-Chester, AGACNP-BC, personally viewed and interpreted this ECG. EKG Interpretation: Date: 05/28/23, EKG Time: 21:26, Rate: 85, Rhythm: NSR, QRS Axis: normal Intervals: prolonged QR, ST/T Wave abnormalities: none, Narrative Interpretation: NSR with prolonged QT Chemistry: Na+: 137, K+: 4.7, BUN/Cr.: 36/2.20, Serum CO2/ AG: 20/13, albumin: 2.8, glucose: 78 Hematology: WBC: 3.2, Hgb: 6.6,  Lactic/ PCT: 7.7 > 8.3/ 23.22, COVID-19 & Influenza A/B: negative UA: +mod Hgb, +large leuks, proteinuria, >50 WBC's  LP: pending ABG: 7.45/ 28/ 132/ 19.5 CXR 05/28/23: low lung volumes without acute or active cardiopulmonary disease. CT head wo contrast 05/28/23: no acute intracranial abnormality. Atrophy and advance chronic small vessel ischemic changes of the white matter. CT chest wo contrast 05/28/23: Trace bilateral pleural effusions. Patchy airspace opacities in the inferior right lower lobe worrisome for pneumonia. Acute/subacute right posterior sixth through tenth rib fractures. Mild cardiomegaly. CT angio GIB 05/28/23: Aortoiliac atherosclerosis.  No aneurysm or dissection. No contrast extravasation to localize GI bleed. Small  amount of pericholecystic fluid. No visible stones or gallbladder wall  thickening. Bladder wall thickening likely related to bladder outlet obstruction. Large stool burden in the rectosigmoid colon. Trace bilateral effusions, bibasilar atelectasis.  PCCM consulted for admission due to acute encephalopathy with inability to protect airway requiring urgent mechanical intubation and ventilatory support in the setting of suspected severe urosepsis.  Pertinent  Medical History  Bradycardia CAD Carotid artery stenosis HLD HTN Prostatitis HFpEF CKD Stage 3b BPH CVA Atrial Fibrillation not on anticoagulation Anemia Memory loss Significant Hospital Events: Including procedures, antibiotic start and stop dates in addition to other pertinent events   05/28/23: Admit to ICU with acute encephalopathy and inability to protect airway requiring urgent EGD mechanical intubation and ventilatory support in the setting of suspected severe urosepsis.  Interim History / Subjective:  Patient RASS -4  Called and spoke with daughter Charles Sims via telephone conference.  Plan of care discussed all questions and concerns answered at this time. Discussed palliative consultation to assist in answering questions. Discussed overall poor prognosis and importance of next 24-48 hours.  Objective   Blood pressure (!) 108/55, pulse 98, temperature (!) 100.6 F (38.1 C), resp. rate (!) 27, SpO2 100%.    Vent Mode: AC FiO2 (%):  [40 %] 40 % Set Rate:  [18 bmp] 18 bmp Vt Set:  [450 mL] 450 mL PEEP:  [5 cmH20] 5 cmH20  No intake or output data in the 24 hours ending 05/28/23 2057 There were no vitals filed for this visit.  Examination: General: Adult male, critically ill, lying in bed intubated & sedated requiring mechanical ventilation, NAD HEENT: MM pale/dry, anicteric, atraumatic, neck supple Neuro: RASS -4, unable to follow commands, PERRL +2, mild tremors and withdrawal to pain in all extremities CV: s1s2 RRR,  NSR on monitor, no r/m/g Pulm: Regular, non labored on PRVC 40% and PEEP of 5, breath sounds clear-BUL & course-BLL GI: soft, rounded, bs x 4 GU: foley in place with clear amber urine Skin: Scattered ecchymosis and scabbed abrasions Extremities: warm/dry, pulses + 2 R/P, trace nonpitting edema noted BLE  Resolved Hospital Problem list     Assessment & Plan:  Acute encephalopathy with inability to protect airway requiring mechanical ventilatory support  - Ventilator settings: PRVC  8 mL/kg, 40% FiO2, 5 PEEP, continue ventilator support & lung protective strategies - Wean PEEP & FiO2 as tolerated, maintain SpO2 > 90% - Head of bed elevated 30 degrees, VAP protocol in place - Plateau pressures less than 30 cm H20  - Intermittent chest x-ray & ABG PRN - Daily WUA with SBT as tolerated  - Ensure adequate pulmonary hygiene  - F/u cultures, trend PCT - HCAP coverage: cefepime - Bronchodilators PRN - PAD protocol in place:  Fentanyl IVP & Propofol drip > only if needed (currently off due to mentation  Suspected Sepsis with shock due to suspected UTI & HCAP Leukopenia Lumbar puncture performed with initial findings not suggestive of meningitis Initial interventions/workup included: 2.25 L of NS/LR & Cefepime/ Vancomycin/ Metronidazole - f/u cultures, trend lactic/ PCT - Daily CBC, monitor WBC/ fever curve - IV antibiotics: cefepime & vancomycin - Continue vasopressors to maintain MAP< 65: norepinephrine - Persistent hypotension consider stress dose steroids  - hold outpatient antihypertensive medications  Acute Encephalopathy CVA- history of Dementia history CTH negative, recent MRI negative - EEG in AM - consider f/u MRI  - monitor neuro status closely - hold sedating medications as tolerated  Acute Kidney Injury superimposed on CKD stage 3b  Risk for Contrast induced nephropathy Mild NAGMA Hypomagnesemia Baseline Cr:  1.5-1.7, Cr on admission: 2.20 - mg supplementation  ordered - Strict I/O's: alert provider if UOP < 0.5 mL/kg/hr - gentle IVF hydration > gentle sodium bicarbonate drip overnight to buffer kidneys - Daily BMP, replace electrolytes PRN - Avoid nephrotoxic agents as able, ensure adequate renal perfusion  Acute on Chronic Anemia unknown etiology in the setting of chronic disease CT angio GIB negative, no signs of bleeding currently - continue with 1 unit of PRBC's > f/u H&H post - Monitor for s/s of bleeding - Daily CBC - Transfuse for Hgb <7 - PPI BID - hold Sq heparin overnight, consider starting DVT prophylaxis in AM - SCD's - consider Gastroenterology follow-up as indicated  HTN HLD PMHx: CAD, bradycardia - continue outpatient Lipitor - hold norvasc and ASA at this time, consider restarting as patient stabilizes  Best Practice (right click and "Reselect all SmartList Selections" daily)  Diet/type: NPO w/ meds via tube DVT prophylaxis: SCD GI prophylaxis: PPI Lines: N/A Foley:  Yes, and it is still needed Code Status:  DNR Last date of multidisciplinary goals of care discussion [05/28/23] Daughter Charles Sims had extensive GOC discussion with EDP which I confirmed. DNR with all other interventions at this time. Grave prognosis dicussed.  Labs   CBC: Recent Labs  Lab 05/27/23 0112 05/28/23 1644 05/28/23 1711  WBC 9.1 3.2* 3.5*  NEUTROABS  --  2.2 2.3  HGB 7.1* 6.6* 6.4*  HCT 21.6* 20.7* 18.7*  MCV 100.5* 105.1* 97.9  PLT 502* 335 321    Basic Metabolic Panel: Recent Labs  Lab 05/27/23 0112 05/28/23 1644  NA 135 137  K 4.3 4.7  CL 104 104  CO2 23 20*  GLUCOSE 95 78  BUN 28* 36*  CREATININE 1.71* 2.20*  CALCIUM 8.1* 7.9*   GFR: Estimated Creatinine Clearance: 21 mL/min (A) (by C-G formula based on SCr of 2.2 mg/dL (H)). Recent Labs  Lab 05/27/23 0112 05/28/23 1644 05/28/23 1711 05/28/23 1853  WBC 9.1 3.2* 3.5*  --   LATICACIDVEN  --  7.7*  --  8.3*    Liver Function Tests: Recent Labs  Lab  05/27/23 0112 05/28/23 1644  AST 18 31  ALT 15 14  ALKPHOS 68 99  BILITOT 0.5 0.8  PROT 5.7* 5.3*  ALBUMIN 3.1* 2.8*   No results for input(s): "LIPASE", "AMYLASE" in the last 168 hours. No results for input(s): "AMMONIA" in the last 168 hours.  ABG    Component Value Date/Time   TCO2 25 05/13/2019 1940     Coagulation Profile: Recent Labs  Lab 05/28/23 1644  INR 1.3*    Cardiac Enzymes: No results for input(s): "CKTOTAL", "CKMB", "CKMBINDEX", "TROPONINI" in the last 168 hours.  HbA1C: Hgb A1c MFr Bld  Date/Time Value Ref Range Status  05/17/2023 10:00 AM 5.2 4.8 - 5.6 % Final    Comment:    (NOTE)         Prediabetes: 5.7 - 6.4         Diabetes: >6.4         Glycemic control for adults with diabetes: <7.0   01/26/2023 04:41 PM 5.1 4.8 - 5.6 % Final    Comment:    (NOTE)         Prediabetes: 5.7 - 6.4         Diabetes: >6.4         Glycemic control for adults with diabetes: <7.0     CBG: No results for input(s): "GLUCAP" in the last 168 hours.  Review of Systems:   UTA- patient intubated and minimally responsive, unable to participate in interview at this time  Past Medical History:  He,  has a past medical history of Adenoma of right adrenal gland, Arthritis, Benign prostatic hypertrophy with lower urinary tract symptoms (LUTS), Bladder calculi, Bradycardia, CAD (coronary artery disease), Carotid stenosis, Coronary artery disease, Dysrhythmia, Elevated PSA, GERD (gastroesophageal reflux disease), Gross hematuria, Hyperlipidemia, Hypertension, Memory loss, Prostatitis, Shortness of breath dyspnea, and Urinary frequency.   Surgical History:   Past Surgical History:  Procedure Laterality Date   CAROTID ENDARTERECTOMY Left    CYSTOSCOPY WITH LITHOLAPAXY N/A 04/13/2015   Procedure: CYSTOSCOPY WITH LITHOLAPAXY;  Surgeon: Vanna Scotland, MD;  Location: [facility] ORS;  Service: Urology;  Laterality: N/A;     Social History:   reports that he quit smoking about 18  years ago. His smoking use included cigarettes. He has never used smokeless tobacco. He reports that he does not drink alcohol and does not use drugs.   Family History:  His family history includes Alzheimer's disease in his mother; Kidney cancer in his father and paternal grandfather.   Allergies Allergies  Allergen Reactions   Lisinopril Cough     Home Medications  Prior to Admission medications   Medication Sig Start Date End Date Taking? Authorizing Provider  acetaminophen (TYLENOL) 325 MG tablet Take 2 tablets (650 mg total) by mouth every 6 (six) hours as needed for mild pain or fever. 05/22/23  Yes Leeroy Bock, MD  amLODipine (NORVASC) 10 MG tablet Take 1 tablet (10 mg total) by mouth daily. 02/12/23  Yes Joaquim Nam, MD  aspirin EC 81 MG tablet Take 1 tablet (81 mg total) by mouth daily. Swallow whole. 01/29/23  Yes Kathlen Mody, MD  atorvastatin (LIPITOR) 80 MG tablet Take 1 tablet (80 mg total) by mouth daily. 01/29/23  Yes Kathlen Mody, MD  FLUoxetine (PROZAC) 20 MG capsule Take 1 capsule (20 mg total) by mouth daily. 01/29/23  Yes Kathlen Mody, MD  melatonin 3 MG TABS tablet Take 6 mg by mouth at bedtime. 05/27/23  Yes [provider]  Multiple Vitamin (MULTIVITAMIN WITH MINERALS) TABS tablet Take 1 tablet by mouth daily. 01/29/23  Yes Kathlen Mody, MD  polyethylene glycol (MIRALAX / GLYCOLAX) 17 g packet Take 17 g by mouth daily as needed for moderate constipation. 01/28/23  Yes Kathlen Mody, MD  senna (SENOKOT) 8.6 MG TABS tablet Take 1 tablet (8.6 mg total) by mouth daily as needed for mild constipation. 05/22/23  Yes Leeroy Bock, MD  traZODone (DESYREL) 50 MG tablet Take 25 mg by mouth at bedtime. Patient not taking: Reported on 05/28/2023 05/25/23   [provider]     Critical care time: 68 minutes       Betsey Holiday, AGACNP-BC Acute Care Nurse Practitioner [facility]   [phone] /  [phone] Please see Amion for details.

## 2023-05-28 NOTE — Sepsis Progress Note (Signed)
Notified provider of need to order repeat lactic acid. ° °

## 2023-05-28 NOTE — ED Notes (Addendum)
Charles Sims at bedside ordered norepinephrine started at current rate.

## 2023-05-28 NOTE — Progress Notes (Signed)
Pharmacy Antibiotic Note  Charles Sims is a 89 y.o. male admitted on 05/28/2023 with HCAP/Sepsis.  Pharmacy has been consulted for Cefepime dosing.  Plan: Cefepime 2 gm q24hr per indication & renal fxn.  Pt given Vancomycin 1500 mg x 1 dose Vancomycin 500 mg IV Q 24 hrs. Goal AUC 400-550. Expected AUC: 457.2 SCr used: 2.2. TBW 68 kg < IBW 73 kg  Pharmacy will continue to follow and will adjust abx dosing whenever warranted.  Temp (24hrs), Avg:100 F (37.8 C), Min:98.4 F (36.9 C), Max:102.8 F (39.3 C)   Recent Labs  Lab 05/27/23 0112 05/28/23 1644 05/28/23 1711 05/28/23 1853  WBC 9.1 3.2* 3.5*  --   CREATININE 1.71* 2.20*  --   --   LATICACIDVEN  --  7.7*  --  8.3*    Estimated Creatinine Clearance: 21 mL/min (A) (by C-G formula based on SCr of 2.2 mg/dL (H)).    Allergies  Allergen Reactions   Lisinopril Cough    Antimicrobials this admission: 7/23 Cefepime >>  7/23 Flagyl >> x 1 dose 7/23 Vancomycin >>  Microbiology results: 7/23 BCx: Pending 7/23 UCx: Pending  7/23 RespCx: Pending  Thank you for allowing pharmacy to be a part of this patient's care.  Otelia Sergeant, PharmD, [place] [facility] 05/28/2023 11:22 PM

## 2023-05-28 NOTE — Progress Notes (Signed)
Pt transported to CT and returned to ED on the vent without incident. Pt remains on the vent and is tol well at this time.

## 2023-05-28 NOTE — ED Provider Notes (Signed)
[facility] Provider Note    Event Date/Time   First MD Initiated Contact with Patient 05/28/23 1630     (approximate)   History   Altered Mental Status   HPI  Charles Sims is a 89 y.o. male with history of hypertension, CAD, CHF, CKD, A-fib, anemia presenting to the emergency department for evaluation of altered mental status.  Per triage report, staff at his facility felt like his tremors have been worse and he has been more confused.  EMS had difficulty obtaining an oxygen saturation.  He was 100% after being placed on nonrebreather.  I did review his discharge summary from 05/22/2023.  At that time, he presented after a fall.  He was noted to have a significant hemoglobin drop, but family declined further GI workup.  He is evaluated PT/OT who recommended SNF placement.  He was also seen in our ER yesterday with concerns for hematemesis.  Stable blood work at that time.  He was discharged.      Physical Exam   Triage Vital Signs: ED Triage Vitals  Encounter Vitals Group     BP 05/28/23 1640 (!) 143/127     Systolic BP Percentile --      Diastolic BP Percentile --      Pulse Rate 05/28/23 1634 (!) 110     Resp 05/28/23 1634 (!) 27     Temp 05/28/23 1634 (!) 102.8 F (39.3 C)     Temp Source 05/28/23 1634 Rectal     SpO2 05/28/23 1634 100 %     Weight --      Height --      Head Circumference --      Peak Flow --      Pain Score --      Pain Loc --      Pain Education --      Exclude from Growth Chart --     Most recent vital signs: Vitals:   05/28/23 2341 05/28/23 2345  BP:  (!) 95/47  Pulse: 80 78  Resp: 16 (!) 24  Temp: 98.1 F (36.7 C) 98.1 F (36.7 C)  SpO2: 100% 100%     General: Eyes closed, diffuse tremors, moans but not clearly responsive, warm to touch CV:  Tachycardic with what appears to be an irregular rhythm though difficult to assess in the setting of tremors Resp:  Limited exam, but no significant increased work  of breathing  Abd:  Soft, nondistended.  Neuro:  Frequent tremors of the bilateral upper extremities, eyes closed, when they are opened, patient does not track, gaze does appear slightly to the left, no nystagmus, on reevaluation, tremors have resolved, patient occasionally moaning, but no appreciable response to painful stimuli, no spontaneous movement, eyes remain closed. ED Results / Procedures / Treatments   Labs (all labs ordered are listed, but only abnormal results are displayed) Labs Reviewed  COMPREHENSIVE METABOLIC PANEL - Abnormal; Notable for the following components:      Result Value   CO2 20 (*)    BUN 36 (*)    Creatinine, Ser 2.20 (*)    Calcium 7.9 (*)    Total Protein 5.3 (*)    Albumin 2.8 (*)    GFR, Estimated 28 (*)    All other components within normal limits  LACTIC ACID, PLASMA - Abnormal; Notable for the following components:   Lactic Acid, Venous 7.7 (*)    All other components within normal limits  LACTIC ACID,  PLASMA - Abnormal; Notable for the following components:   Lactic Acid, Venous 8.3 (*)    All other components within normal limits  CBC WITH DIFFERENTIAL/PLATELET - Abnormal; Notable for the following components:   WBC 3.2 (*)    RBC 1.97 (*)    Hemoglobin 6.6 (*)    HCT 20.7 (*)    MCV 105.1 (*)    RDW 15.9 (*)    Monocytes Absolute 0.0 (*)    All other components within normal limits  PROTIME-INR - Abnormal; Notable for the following components:   Prothrombin Time 16.8 (*)    INR 1.3 (*)    All other components within normal limits  URINALYSIS, W/ REFLEX TO CULTURE (INFECTION SUSPECTED) - Abnormal; Notable for the following components:   Color, Urine YELLOW (*)    APPearance CLOUDY (*)    Hgb urine dipstick MODERATE (*)    Protein, ur 100 (*)    Leukocytes,Ua LARGE (*)    Bacteria, UA MANY (*)    All other components within normal limits  CBC WITH DIFFERENTIAL/PLATELET - Abnormal; Notable for the following components:   WBC 3.5 (*)     RBC 1.91 (*)    Hemoglobin 6.4 (*)    HCT 18.7 (*)    RDW 15.7 (*)    All other components within normal limits  CSF CELL COUNT WITH DIFFERENTIAL - Abnormal; Notable for the following components:   Color, CSF CLEAR (*)    Appearance, CSF COLORLESS (*)    RBC Count, CSF 144 (*)    All other components within normal limits  CSF CELL COUNT WITH DIFFERENTIAL - Abnormal; Notable for the following components:   Color, CSF CLEAR (*)    Appearance, CSF COLORLESS (*)    RBC Count, CSF 17 (*)    All other components within normal limits  BLOOD GAS, ARTERIAL - Abnormal; Notable for the following components:   pCO2 arterial 28 (*)    pO2, Arterial 132 (*)    Bicarbonate 19.5 (*)    Acid-base deficit 3.7 (*)    All other components within normal limits  LACTIC ACID, PLASMA - Abnormal; Notable for the following components:   Lactic Acid, Venous 5.5 (*)    All other components within normal limits  MAGNESIUM - Abnormal; Notable for the following components:   Magnesium 1.5 (*)    All other components within normal limits  GLUCOSE, CAPILLARY - Abnormal; Notable for the following components:   Glucose-Capillary 115 (*)    All other components within normal limits  RESP PANEL BY RT-PCR (RSV, FLU A&B, COVID)  RVPGX2  CSF CULTURE W GRAM STAIN  CULTURE, BLOOD (ROUTINE X 2)  CULTURE, BLOOD (ROUTINE X 2)  URINE CULTURE  MRSA NEXT GEN BY PCR, NASAL  CULTURE, RESPIRATORY W GRAM STAIN  PROTEIN AND GLUCOSE, CSF  PROCALCITONIN  PHOSPHORUS  CBC  MAGNESIUM  PHOSPHORUS  TRIGLYCERIDES  BLOOD GAS, VENOUS  HIV ANTIBODY (ROUTINE TESTING W REFLEX)  COMPREHENSIVE METABOLIC PANEL  PROCALCITONIN  LACTIC ACID, PLASMA  PREPARE RBC (CROSSMATCH)  TYPE AND SCREEN     EKG EKG independently reviewed interpreted by myself (ER attending) demonstrates:    RADIOLOGY Imaging independently reviewed and interpreted by myself demonstrates:    PROCEDURES:  Critical Care performed: Yes, see critical care  procedure note(s)  CRITICAL CARE Performed by: Trinna Post   Total critical care time: 45 minutes  Critical care time was exclusive of separately billable procedures and treating other patients.  Critical care  was necessary to treat or prevent imminent or life-threatening deterioration.  Critical care was time spent personally by me on the following activities: development of treatment plan with patient and/or surrogate as well as nursing, discussions with consultants, evaluation of patient's response to treatment, examination of patient, obtaining history from patient or surrogate, ordering and performing treatments and interventions, ordering and review of laboratory studies, ordering and review of radiographic studies, pulse oximetry and re-evaluation of patient's condition.   .Lumbar Puncture  Date/Time: 05/28/2023 11:52 PM  Performed by: Trinna Post, MD Authorized by: Trinna Post, MD   Consent:    Consent obtained:  Emergent situation Pre-procedure details:    Procedure purpose:  Diagnostic   Preparation: Patient was prepped and draped in usual sterile fashion   Procedure details:    Lumbar space:  L3-L4 interspace   Patient position:  R lateral decubitus   Needle gauge:  20   Ultrasound guidance: no     Number of attempts:  1   Opening pressure (cm H2O):  18   Fluid appearance:  Clear   Tubes of fluid:  4   Total volume (ml):  7 Post-procedure details:    Puncture site:  Adhesive bandage applied   Procedure completion:  Tolerated    MEDICATIONS ORDERED IN ED: Medications  propofol (DIPRIVAN) 1000 MG/100ML infusion (0 mcg/kg/min  68 kg Intravenous Stopped 05/28/23 2318)  docusate (COLACE) 50 MG/5ML liquid 100 mg (has no administration in time range)  polyethylene glycol (MIRALAX / GLYCOLAX) packet 17 g (has no administration in time range)  docusate (COLACE) 50 MG/5ML liquid 100 mg (100 mg Per Tube Given 05/28/23 2304)  polyethylene glycol (MIRALAX / GLYCOLAX) packet 17 g  (17 g Per Tube Given 05/28/23 2305)  fentaNYL (SUBLIMAZE) injection 25 mcg (25 mcg Intravenous Given 05/28/23 2341)  fentaNYL (SUBLIMAZE) injection 25-100 mcg (has no administration in time range)  sodium bicarbonate 150 mEq in dextrose 5 % 1,150 mL infusion ( Intravenous Infusion Verify 05/28/23 2300)  acetaminophen (TYLENOL) suppository 650 mg (has no administration in time range)    Or  acetaminophen (TYLENOL) tablet 650 mg (has no administration in time range)  Chlorhexidine Gluconate Cloth 2 % PADS 6 each (has no administration in time range)  0.9 %  sodium chloride infusion (has no administration in time range)  norepinephrine (LEVOPHED) 4mg  in (0.016 mg/mL) premix infusion (9 mcg/min Intravenous New Bag/Given 05/28/23 2307)  pantoprazole (PROTONIX) injection 40 mg (has no administration in time range)  ceFEPIme (MAXIPIME) 2 g in sodium chloride 0.9 % 100 mL IVPB (has no administration in time range)  magnesium sulfate IVPB 2 g 50 mL (has no administration in time range)  atorvastatin (LIPITOR) tablet 80 mg (has no administration in time range)  FLUoxetine (PROZAC) capsule 20 mg (has no administration in time range)  midazolam (VERSED) injection 2 mg (2 mg Intravenous Given 05/28/23 1712)  lactated ringers bolus 1,000 mL (0 mLs Intravenous Stopped 05/28/23 1928)    And  lactated ringers bolus 1,000 mL (0 mLs Intravenous Stopped 05/28/23 1928)    And  lactated ringers bolus 250 mL (0 mLs Intravenous Stopped 05/28/23 2004)  dexamethasone (DECADRON) injection 10 mg (10 mg Intravenous Given 05/28/23 1730)  ceFEPIme (MAXIPIME) 2 g in sodium chloride 0.9 % 100 mL IVPB (0 g Intravenous Stopped 05/28/23 1758)  metroNIDAZOLE (FLAGYL) IVPB 500 mg (0 mg Intravenous Stopped 05/28/23 1928)  acetaminophen (TYLENOL) suppository 650 mg (650 mg Rectal Given 05/28/23 1731)  vancomycin (VANCOREADY) IVPB 1500 mg/300  mL (0 mg Intravenous Stopped 05/28/23 2256)  norepinephrine (LEVOPHED) 4-5 MG/250ML-% infusion  SOLN (0 mcg/kg/min  Stopped 05/28/23 2307)  etomidate (AMIDATE) injection 20 mg (20 mg Intravenous Given 05/28/23 1859)  rocuronium bromide 10 mg/mL (PF) syringe (70 mg Intravenous Given 05/28/23 1900)  0.9 %  sodium chloride infusion (0 mL/hr Intravenous Stopped 05/28/23 2329)  iohexol (OMNIPAQUE) 350 MG/ML injection 75 mL (75 mLs Intravenous Contrast Given 05/28/23 2151)  sterile water (preservative free) injection (10 mLs  Given 05/28/23 2306)  bisacodyl (DULCOLAX) suppository 10 mg (10 mg Rectal Given 05/28/23 2321)     IMPRESSION / MDM / ASSESSMENT AND PLAN / ED COURSE  I reviewed the triage vital signs and the nursing notes.  Differential diagnosis includes, but is not limited to, broad initial differential including questionable sepsis with possible sources including UTI, pneumonia, meningitis/encephalitis, acute blood loss anemia, electrolyte abnormality, acute intracranial bleed, CVA  Patient's presentation is most consistent with acute presentation with potential threat to life or bodily function.  89 year old male presenting to the emergency department with altered mental status.  Unclear exact last known well, so code stroke was not activated.  He did have upper extremity shaking on exam, but this was in the setting of fever and I suspect that this may be more rigors.  He was ordered for Tylenol and a small dose of Versed with improvement in this, but no significant change in his mental status.  I did initially initiate sepsis orders with empiric cefepime, Flagyl, vancomycin as well as Decadron for possible CNS infection.  His lactate was significantly elevated and he was ordered for 30 cc/kg of fluid.  Patient did have a significantly depressed mental status on arrival.  However, patient did present with a DNR.  No MOST form.  I initially was unable to get in touch with patient's daughter.  I did review the patient's most recent discharge summary at which time it appeared that further GI  workup was not pursued based on family wishes.  In light of this, I did hold off on invasive measures including intubation, lumbar puncture.  After I was able to speak with patient's daughter, she expressed that she did wish for her father to be a DNR, but otherwise did want aggressive care measures.  I did discuss this further with her including intubation, lumbar puncture, vasopressors.  She did report that she wished to proceed with all of these as needed with the goal of prolonging his life as long as possible.  With this, patient was intubated for airway protection.  Head CT was obtained without evidence of acute bleed.  Lab work notable for new leukopenia with WBC of 3.5, normal neutrophil count.  CMP with AKI on CKD with creatinine of 2.2.  Hemoglobin dropped to 6.6, ordered for 1 unit PRBCs, daughter did wish to proceed with this.  UA concerning for infection.  With patient's abnormal mental status and shaking on presentation, I did review the case with Dr. Otelia Limes with neurology.  Based on my description and the patient's clinical presentation, he felt that this was more likely reflective of rigors and did recommend pursuing further sepsis workup including meningitis/encephalitis, but felt it was less likely that patient had an acute seizure episode and did not recommend transfer to outside facility for continuous EEG.  Lumbar puncture was performed as above.  ICU was consulted.  They did recommend additional testing including CT chest, CT GI bleed protocol.  These were ordered.  Patient did have decreased GFR  at 42, so I did review the case with Dr. Virgilio Belling with [facility] radiology who did feel that it was reasonable to proceed with scan in the light of patient's clinical presentation.  Patient was transferred to the ICU for further evaluation and management.    FINAL CLINICAL IMPRESSION(S) / ED DIAGNOSES   Final diagnoses:  Acute encephalopathy  Acute cystitis without hematuria  Acute  anemia     Rx / DC Orders   ED Discharge Orders     None        Note:  This document was prepared using Dragon voice recognition software and may include unintentional dictation errors.   Trinna Post, MD 05/29/23 Marlyne Beards

## 2023-05-28 NOTE — Sepsis Progress Note (Signed)
Elink monitoring for the code sepsis protocol.  

## 2023-05-28 NOTE — Consult Note (Signed)
CODE SEPSIS - PHARMACY COMMUNICATION  **Broad Spectrum Antibiotics should be administered within 1 hour of Sepsis diagnosis**  Time Code Sepsis Called/Page Received: 1718  Antibiotics Ordered: vancomycin, cefepime, metronidazole  Time of 1st antibiotic administration: 1728  Additional action taken by pharmacy: N/A  Celene Squibb, PharmD Clinical Pharmacist 05/28/2023 5:29 PM

## 2023-05-28 NOTE — ED Triage Notes (Signed)
Pt via EMS from [facility]. Per EMS pt has been more altered than normal. Pt was a new admit to the facility. Per staff, since this this AM his tremors have been worse and has been more altered. EMS report they we unable to get a good O2 sat on him, placed pt on a NRB. On arrival, pt's O2 is 100% on RA, EMS report lung sounds clear. Pt responsive to voice.

## 2023-05-28 NOTE — Sepsis Progress Note (Signed)
Notified bedside nurse of need to draw repeat lactic acid. 

## 2023-05-29 ENCOUNTER — Ambulatory Visit: Payer: Medicare HMO

## 2023-05-29 DIAGNOSIS — Z66 Do not resuscitate: Secondary | ICD-10-CM

## 2023-05-29 DIAGNOSIS — G9341 Metabolic encephalopathy: Secondary | ICD-10-CM | POA: Diagnosis not present

## 2023-05-29 DIAGNOSIS — Z7189 Other specified counseling: Secondary | ICD-10-CM | POA: Diagnosis not present

## 2023-05-29 DIAGNOSIS — Z515 Encounter for palliative care: Secondary | ICD-10-CM

## 2023-05-29 DIAGNOSIS — E43 Unspecified severe protein-calorie malnutrition: Secondary | ICD-10-CM | POA: Diagnosis present

## 2023-05-29 DIAGNOSIS — N3000 Acute cystitis without hematuria: Secondary | ICD-10-CM

## 2023-05-29 DIAGNOSIS — A419 Sepsis, unspecified organism: Secondary | ICD-10-CM | POA: Diagnosis not present

## 2023-05-29 LAB — CBC
HCT: 22.8 % — ABNORMAL LOW (ref 39.0–52.0)
Hemoglobin: 7.5 g/dL — ABNORMAL LOW (ref 13.0–17.0)
MCH: 32.3 pg (ref 26.0–34.0)
MCHC: 32.9 g/dL (ref 30.0–36.0)
MCV: 98.3 fL (ref 80.0–100.0)
Platelets: 291 10*3/uL (ref 150–400)
RBC: 2.32 MIL/uL — ABNORMAL LOW (ref 4.22–5.81)
RDW: 17.7 % — ABNORMAL HIGH (ref 11.5–15.5)
WBC: 33.9 10*3/uL — ABNORMAL HIGH (ref 4.0–10.5)
nRBC: 0 % (ref 0.0–0.2)

## 2023-05-29 LAB — BLOOD CULTURE ID PANEL (REFLEXED) - BCID2

## 2023-05-29 LAB — BLOOD GAS, VENOUS
Acid-base deficit: 2.7 mmol/L — ABNORMAL HIGH (ref 0.0–2.0)
Bicarbonate: 22.6 mmol/L (ref 20.0–28.0)
FIO2: 40 %
MECHVT: 450 mL
O2 Saturation: 16.9 %
PEEP: 5 cmH2O
Patient temperature: 37
RATE: 12 resp/min
pCO2, Ven: 40 mmHg — ABNORMAL LOW (ref 44–60)
pH, Ven: 7.36 (ref 7.25–7.43)
pO2, Ven: <31 mmHg — CL (ref 32–45)

## 2023-05-29 LAB — URINE CULTURE

## 2023-05-29 LAB — COMPREHENSIVE METABOLIC PANEL
ALT: 17 U/L (ref 0–44)
AST: 42 U/L — ABNORMAL HIGH (ref 15–41)
Albumin: 2.3 g/dL — ABNORMAL LOW (ref 3.5–5.0)
Alkaline Phosphatase: 76 U/L (ref 38–126)
Anion gap: 11 (ref 5–15)
BUN: 40 mg/dL — ABNORMAL HIGH (ref 8–23)
CO2: 21 mmol/L — ABNORMAL LOW (ref 22–32)
Calcium: 7.4 mg/dL — ABNORMAL LOW (ref 8.9–10.3)
Chloride: 100 mmol/L (ref 98–111)
Creatinine, Ser: 2.29 mg/dL — ABNORMAL HIGH (ref 0.61–1.24)
GFR, Estimated: 26 mL/min — ABNORMAL LOW (ref >60)
Glucose, Bld: 171 mg/dL — ABNORMAL HIGH (ref 70–99)
Potassium: 4.2 mmol/L (ref 3.5–5.1)
Sodium: 132 mmol/L — ABNORMAL LOW (ref 135–145)
Total Bilirubin: 2 mg/dL — ABNORMAL HIGH (ref 0.3–1.2)
Total Protein: 4.5 g/dL — ABNORMAL LOW (ref 6.5–8.1)

## 2023-05-29 LAB — MRSA NEXT GEN BY PCR, NASAL: MRSA by PCR Next Gen: NOT DETECTED

## 2023-05-29 LAB — TYPE AND SCREEN
ABO/RH(D): A POS
Antibody Screen: NEGATIVE
Unit division: 0

## 2023-05-29 LAB — BPAM RBC
Blood Product Expiration Date: 202407292359
Unit Type and Rh: 6200

## 2023-05-29 LAB — BASIC METABOLIC PANEL
Anion gap: 12 (ref 5–15)
BUN: 41 mg/dL — ABNORMAL HIGH (ref 8–23)
CO2: 19 mmol/L — ABNORMAL LOW (ref 22–32)
Calcium: 7.3 mg/dL — ABNORMAL LOW (ref 8.9–10.3)
Chloride: 99 mmol/L (ref 98–111)
Creatinine, Ser: 2.17 mg/dL — ABNORMAL HIGH (ref 0.61–1.24)
GFR, Estimated: 28 mL/min — ABNORMAL LOW (ref >60)
Glucose, Bld: 150 mg/dL — ABNORMAL HIGH (ref 70–99)
Potassium: 3.8 mmol/L (ref 3.5–5.1)
Sodium: 130 mmol/L — ABNORMAL LOW (ref 135–145)

## 2023-05-29 LAB — LACTIC ACID, PLASMA
Lactic Acid, Venous: 5.8 mmol/L (ref 0.5–1.9)
Lactic Acid, Venous: 4.8 mmol/L (ref 0.5–1.9)
Lactic Acid, Venous: 3.3 mmol/L (ref 0.5–1.9)
Lactic Acid, Venous: 2.4 mmol/L (ref 0.5–1.9)

## 2023-05-29 LAB — CULTURE, BLOOD (ROUTINE X 2): Special Requests: ADEQUATE

## 2023-05-29 LAB — GLUCOSE, CAPILLARY
Glucose-Capillary: 141 mg/dL — ABNORMAL HIGH (ref 70–99)
Glucose-Capillary: 145 mg/dL — ABNORMAL HIGH (ref 70–99)
Glucose-Capillary: 143 mg/dL — ABNORMAL HIGH (ref 70–99)
Glucose-Capillary: 127 mg/dL — ABNORMAL HIGH (ref 70–99)
Glucose-Capillary: 117 mg/dL — ABNORMAL HIGH (ref 70–99)

## 2023-05-29 LAB — PROCALCITONIN: Procalcitonin: >150 ng/mL

## 2023-05-29 LAB — MAGNESIUM: Magnesium: 2.2 mg/dL (ref 1.7–2.4)

## 2023-05-29 LAB — CSF CULTURE W GRAM STAIN: Gram Stain: NONE SEEN

## 2023-05-29 LAB — PHOSPHORUS: Phosphorus: 3.3 mg/dL (ref 2.5–4.6)

## 2023-05-29 LAB — CULTURE, RESPIRATORY W GRAM STAIN

## 2023-05-29 LAB — TRIGLYCERIDES: Triglycerides: 85 mg/dL (ref <150)

## 2023-05-29 LAB — HIV ANTIBODY (ROUTINE TESTING W REFLEX): HIV Screen 4th Generation wRfx: NONREACTIVE

## 2023-05-29 MED ORDER — INSULIN ASPART 100 UNIT/ML IJ SOLN
0.0000 [IU] | INTRAMUSCULAR | Status: DC
  Administered 2023-05-30 (×4): 1 [IU] via SUBCUTANEOUS
  Filled 2023-05-29 (×4): qty 1

## 2023-05-29 MED ORDER — SODIUM CHLORIDE 0.9 % IV SOLN
2.0000 g | INTRAVENOUS | Status: DC
  Administered 2023-05-29 – 2023-06-01 (×4): 2 g via INTRAVENOUS
  Filled 2023-05-29 (×4): qty 20

## 2023-05-29 MED ORDER — LACTATED RINGERS IV BOLUS
500.0000 mL | Freq: Once | INTRAVENOUS | Status: AC
  Administered 2023-05-29: 500 mL via INTRAVENOUS

## 2023-05-29 MED ORDER — LACTATED RINGERS IV BOLUS
500.0000 mL | Freq: Once | INTRAVENOUS | Status: DC
Start: 2023-05-29 — End: 2023-05-29

## 2023-05-29 MED ORDER — ALBUMIN HUMAN 25 % IV SOLN
25.0000 g | Freq: Once | INTRAVENOUS | Status: AC
  Administered 2023-05-29: 25 g via INTRAVENOUS
  Filled 2023-05-29: qty 100

## 2023-05-29 MED ORDER — SODIUM BICARBONATE 8.4 % IV SOLN
INTRAVENOUS | Status: AC
  Filled 2023-05-29: qty 150
  Filled 2023-05-29 (×2): qty 1000

## 2023-05-29 MED ORDER — ORAL CARE MOUTH RINSE
15.0000 mL | OROMUCOSAL | Status: DC
  Administered 2023-05-29 – 2023-05-30 (×22): 15 mL via OROMUCOSAL

## 2023-05-29 MED ORDER — MIDAZOLAM HCL 2 MG/2ML IJ SOLN: 1.0000 mg | INTRAMUSCULAR | Status: DC | PRN

## 2023-05-29 MED ORDER — LACTATED RINGERS IV BOLUS
1000.0000 mL | Freq: Once | INTRAVENOUS | Status: AC
  Administered 2023-05-29: 1000 mL via INTRAVENOUS

## 2023-05-29 MED ORDER — ORAL CARE MOUTH RINSE: 15.0000 mL | OROMUCOSAL | Status: DC | PRN

## 2023-05-29 MED ORDER — HYDROCORTISONE SOD SUC (PF) 100 MG IJ SOLR
100.0000 mg | Freq: Once | INTRAMUSCULAR | Status: AC
  Administered 2023-05-29: 100 mg via INTRAVENOUS
  Filled 2023-05-29: qty 2

## 2023-05-29 MED ORDER — HYDROCORTISONE SOD SUC (PF) 100 MG IJ SOLR
50.0000 mg | Freq: Three times a day (TID) | INTRAMUSCULAR | Status: DC
  Administered 2023-05-29 – 2023-06-01 (×10): 50 mg via INTRAVENOUS
  Filled 2023-05-29 (×10): qty 2

## 2023-05-29 MED ORDER — VANCOMYCIN HCL 500 MG/100ML IV SOLN: 500.0000 mg | INTRAVENOUS | Status: DC

## 2023-05-29 MED ORDER — PHENYLEPHRINE HCL-NACL 20-0.9 MG/250ML-% IV SOLN
0.0000 ug/min | INTRAVENOUS | Status: DC
  Administered 2023-05-29: 20 ug/min via INTRAVENOUS
  Administered 2023-05-29 (×2): 30 ug/min via INTRAVENOUS
  Filled 2023-05-29 (×3): qty 250

## 2023-05-29 MED ORDER — PROSOURCE TF20 ENFIT COMPATIBL EN LIQD
60.0000 mL | Freq: Every day | ENTERAL | Status: DC
  Administered 2023-05-29 – 2023-05-30 (×2): 60 mL
  Filled 2023-05-29 (×2): qty 60

## 2023-05-29 MED ORDER — FREE WATER
30.0000 mL | Status: DC
  Administered 2023-05-29 – 2023-05-30 (×5): 30 mL

## 2023-05-29 MED ORDER — VITAL 1.5 CAL PO LIQD
1000.0000 mL | ORAL | Status: DC
  Administered 2023-05-29: 1000 mL

## 2023-05-29 NOTE — Progress Notes (Incomplete)
Pharmacy Antibiotic Note  Charles Sims is a 89 y.o. male admitted on 05/28/2023 with HCAP.  Pharmacy has been consulted for Cefepime dosing.  Plan: Cefepime 2 gm q24hr per indication & renal fxn.  Pt given Vancomycin 1500 mg x 1 dose Vancomycin *** mg IV Q *** hrs. Goal AUC 400-550. Expected AUC: *** SCr used: ***  Pharmacy will continue to follow and will adjust abx dosing whenever warranted.  Temp (24hrs), Avg:100 F (37.8 C), Min:98.4 F (36.9 C), Max:102.8 F (39.3 C)   Recent Labs  Lab 05/27/23 0112 05/28/23 1644 05/28/23 1711 05/28/23 1853  WBC 9.1 3.2* 3.5*  --   CREATININE 1.71* 2.20*  --   --   LATICACIDVEN  --  7.7*  --  8.3*    Estimated Creatinine Clearance: 21 mL/min (A) (by C-G formula based on SCr of 2.2 mg/dL (H)).    Allergies  Allergen Reactions  . Lisinopril Cough    Antimicrobials this admission: 7/23 Cefepime >>  7/23 Flagyl >> x 1 dose 7/23 Vancomycin >> x 1 dose  Microbiology results: 7/23 BCx: Pending 7/23 UCx: Pending  7/23 RespCx: Pending  Thank you for allowing pharmacy to be a part of this patient's care.  Otelia Sergeant, PharmD, [facility] 05/28/2023 11:22 PM

## 2023-05-29 NOTE — Progress Notes (Signed)
NAME:  Charles Sims, MRN:  [id number], DOB:  [birth year], LOS: 1 ADMISSION DATE:  05/28/2023, CONSULTATION DATE:  05/28/23 REFERRING MD:  Dr. Rosalia Hammers,   CHIEF COMPLAINT: Mental status changes  History of Present Illness:  89 yo M presenting to [facility] ED from [facility] via EMS for evaluation of AMS.  History provided per chart review and daughter, [name]'s, supplementation via telephone report as patient is intubated and unresponsive at this time.  Patient was recently discharged on 05/22/2023, at which point he was transferred to [facility] after PT/OT had recommended SNF placement.  This recent admission was after a fall and during admission he was noted to have a significant hemoglobin drop but family declined further GI workup due to overall decline.  At time of admission so reported baseline mentation to be ability to recognize family members, intermittent confusion- not oriented to place/time. Patient was also seen on 05/27/2023 in the ED due to concern of suspected hematemesis. On 7/22 Hgb was 7.1, no signs of bleeding noted, negative rectal exam.  At discharge, he was reportedly back to baseline then over the last several days (7/20-7/21), family reports mentation as becoming progressively worse. They deny any other complaints. No complaints of dyspnea, productive cough, urinary symptoms, falls, nausea/vomiting/diarrhea, chest pain per patient or SNF staff. Patient does have baseline tremors that have been worsening over the last few years. Per EMS the SNF staff were concerned feeling the patient's tremors had worsened as well as becoming progressively altered.  EMS reported difficulty obtaining in SpO2 reading but that he was 100% after being placed on a nonrebreather for transport.  ED course: Upon arrival patient GCS 3-4, febrile, hypotensive & tachypneic with what appeared to be rigors. Sepsis protocol initiated with empiric antibiotics & IVF resuscitation. Oxygenation stable but  mentation unimproved while in ED. Lengthy GOC discussion between EDP & daughter, [name]. Patient confirmed to be DNR but while still wanting all other needed interventions including mechanical intubation and ventilatory support. Patient urgently intubated due to inability to protect airway. Case discussed with Neurology on call, patient not a candidate for continuous EEG monitoring as tremors felt to be rigors due to temperature. Initial CTH negative, CXR clear. Labs significant for leukopenia, anemia, AKI, mild NAGMA, hypoalbuminemia with significant lactic acidosis and UTI. Medications given: acetaminophen, decadron, etomidate/ versed/ rocuronium, cefepime/ flagyl/ vancomycin, 2.25 L IVF bolus Initial Vitals: 102.8/ 33/ 120/ 76/56 & 97% on RA EKG Interpretation: Date: 05/28/23, EKG Time: 21:26, Rate: 85, Rhythm: NSR, QRS Axis: normal Intervals: prolonged QR, ST/T Wave abnormalities: none, Narrative Interpretation: NSR with prolonged QT Chemistry: Na+: 137, K+: 4.7, BUN/Cr.: 36/2.20, Serum CO2/ AG: 20/13, albumin: 2.8, glucose: 78 Hematology: WBC: 3.2, Hgb: 6.6,  Lactic/ PCT: 7.7 > 8.3/ 23.22, COVID-19 & Influenza A/B: negative UA: +mod Hgb, +large leuks, proteinuria, >50 WBC's  LP: pending ABG: 7.45/ 28/ 132/ 19.5 CXR 05/28/23: low lung volumes without acute or active cardiopulmonary disease. CT head wo contrast 05/28/23: no acute intracranial abnormality. Atrophy and advance chronic small vessel ischemic changes of the white matter. CT chest wo contrast 05/28/23: Trace bilateral pleural effusions. Patchy airspace opacities in the inferior right lower lobe worrisome for pneumonia. Acute/subacute right posterior sixth through tenth rib fractures. Mild cardiomegaly. CT angio GIB 05/28/23: Aortoiliac atherosclerosis.  No aneurysm or dissection. No contrast extravasation to localize GI bleed. Small amount of pericholecystic fluid. No visible stones or gallbladder wall thickening. Bladder wall thickening  likely related to bladder outlet obstruction. Large stool burden in  the rectosigmoid colon. Trace bilateral effusions, bibasilar atelectasis.  PCCM consulted for admission due to acute encephalopathy with inability to protect airway requiring urgent mechanical intubation and ventilatory support in the setting of suspected severe urosepsis.  Pertinent  Medical History  Bradycardia CAD Carotid artery stenosis HLD HTN Prostatitis HFpEF CKD Stage 3b BPH CVA Atrial Fibrillation not on anticoagulation Anemia Memory loss Significant Hospital Events: Including procedures, antibiotic start and stop dates in addition to other pertinent events   05/28/23: Admit to ICU with acute encephalopathy and inability to protect airway requiring urgent EGD mechanical intubation and ventilatory support in the setting of suspected severe urosepsis. 7/24 severe septic shock, multiorgan failure +E coli Bacteremia/Enterobactroles Bacteremia  Interim History / Subjective:  Severe metabolic acidosis and metabolic encephalopathy Severe septic shock on multiple pressors +multiorgan failure Patient is in the dying process   Objective   Blood pressure (!) 88/57, pulse 74, temperature 98.6 F (37 C), resp. rate 10, height 5\' 10"  (1.778 m), weight 66.1 kg, SpO2 95%.    Vent Mode: PRVC FiO2 (%):  [40 %] 40 % Set Rate:  [12 bmp-18 bmp] 12 bmp Vt Set:  [450 mL] 450 mL PEEP:  [5 cmH20] 5 cmH20   Intake/Output Summary (Last 24 hours) at 05/29/2023 0710 Last data filed at 05/29/2023 0600 Gross per 24 hour  Intake 3181.43 ml  Output 270 ml  Net 2911.43 ml   Filed Weights   05/28/23 2215 05/29/23 0258  Weight: 66.1 kg 66.1 kg      REVIEW OF SYSTEMS  PATIENT IS UNABLE TO PROVIDE COMPLETE REVIEW OF SYSTEMS DUE TO SEVERE CRITICAL ILLNESS   PHYSICAL EXAMINATION:  GENERAL:critically ill appearing, +resp distress EYES: Pupils equal, round, reactive to light.  No scleral icterus.  MOUTH: Moist mucosal  membrane. INTUBATED NECK: Supple.  PULMONARY: Lungs clear to auscultation, +rhonchi,  CARDIOVASCULAR: S1 and S2.  Regular rate and rhythm GASTROINTESTINAL: Soft, nontender, -distended. Positive bowel sounds.  MUSCULOSKELETAL: No swelling, clubbing, or edema.  NEUROLOGIC: obtunded,sedated SKIN:normal, warm to touch, Capillary refill delayed  Pulses present bilaterally    Assessment & Plan:  89 yo white male admitted to ICU for acute and severe Acute metabolic encephalopathy with severe resp failure due to inability to protect airway requiring mechanical ventilatory support due to severe acidosis and septic shock from GRAM NEGATIVE BACTEREMIA with progressive renal failure and liver dysfunction Severe ACUTE Hypoxic and Hypercapnic Respiratory Failure -continue Mechanical Ventilator support -Wean Fio2 and PEEP as tolerated -VAP/VENT bundle implementation - Wean PEEP & FiO2 as tolerated, maintain SpO2 > 88% - Head of bed elevated 30 degrees, VAP protocol in place - Plateau pressures less than 30 cm H20  - Intermittent chest x-ray & ABG PRN - Ensure adequate pulmonary hygiene  -will perform SAT/SBT when respiratory parameters are met  SEPTIC shock SOURCE-UTI & HCAP -follow ABG and LA as needed -consider stress dose steroids -aggressive IV fluid Resuscitation Lumbar puncture performed with initial findings not suggestive of meningitis Initial interventions/workup included: 2.25 L of NS/LR & Cefepime/ Vancomycin/ Metronidazole - f/u cultures, trend lactic/ PCT - Daily CBC, monitor WBC/ fever curve - IV antibiotics: cefepime & vancomycin - Continue vasopressors to maintain MAP< 65: norepinephrine - Persistent hypotension consider stress dose steroids  - hold outpatient antihypertensive medications    NEUROLOGY ACUTE METABOLIC ENCEPHALOPATHY due to severe septic shock and acidosis Avoid sedatives CVA- history of Dementia history CTH negative, recent MRI negative - EEG in  AM  ACUTE KIDNEY INJURY/Renal Failure on CKD stage 3b  -  continue Foley Catheter-assess need -Avoid nephrotoxic agents -Follow urine output, BMP -Ensure adequate renal perfusion, optimize oxygenation -Renal dose medications Hypomagnesemia   Intake/Output Summary (Last 24 hours) at 05/29/2023 0717 Last data filed at 05/29/2023 0600 Gross per 24 hour  Intake 3181.43 ml  Output 270 ml  Net 2911.43 ml       Acute on Chronic Anemia unknown etiology in the setting of chronic disease CT angio GIB negative, no signs of bleeding currently - continue with 1 unit of PRBC's > f/u H&H post - Monitor for s/s of bleeding - Daily CBC - Transfuse for Hgb <7 - PPI BID - hold Sq heparin overnight, consider starting DVT prophylaxis in AM - SCD's - consider Gastroenterology follow-up as indicated  ENDO - ICU hypoglycemic\Hyperglycemia protocol -check FSBS per protocol   GI GI PROPHYLAXIS as indicated  NUTRITIONAL STATUS DIET-->TF's as tolerated Constipation protocol as indicated   ELECTROLYTES -follow labs as needed -replace as needed -pharmacy consultation and following  Best Practice (right click and "Reselect all SmartList Selections" daily)  Diet/type: NPO w/ meds via tube DVT prophylaxis: SCD GI prophylaxis: PPI Lines: N/A Foley:  Yes, and it is still needed Code Status:  DNR Last date of multidisciplinary goals of care discussion [05/28/23] Daughter [name] had extensive GOC discussion with EDP which I confirmed. DNR with all other interventions at this time. Grave prognosis dicussed.  Labs   CBC: Recent Labs  Lab 05/27/23 0112 05/28/23 1644 05/28/23 1711 05/29/23 0430  WBC 9.1 3.2* 3.5* 33.9*  NEUTROABS  --  2.2 2.3  --   HGB 7.1* 6.6* 6.4* 7.5*  HCT 21.6* 20.7* 18.7* 22.8*  MCV 100.5* 105.1* 97.9 98.3  PLT 502* 335 321 291    Basic Metabolic Panel: Recent Labs  Lab 05/27/23 0112 05/28/23 1644 05/29/23 0429  NA 135 137 132*  K 4.3 4.7 4.2  CL 104 104  100  CO2 23 20* 21*  GLUCOSE 95 78 171*  BUN 28* 36* 40*  CREATININE 1.71* 2.20* 2.29*  CALCIUM 8.1* 7.9* 7.4*  MG  --  1.5* 2.2  PHOS  --  3.0 3.3   GFR: Estimated Creatinine Clearance: 19.6 mL/min (A) (by C-G formula based on SCr of 2.29 mg/dL (H)). Recent Labs  Lab 05/27/23 0112 05/28/23 1644 05/28/23 1711 05/28/23 1853 05/28/23 2245 05/29/23 0429 05/29/23 0430  PROCALCITON  --  23.22  --   --   --  >150.00  --   WBC 9.1 3.2* 3.5*  --   --   --  33.9*  LATICACIDVEN  --  7.7*  --  8.3* 5.5* 5.8*  --     Liver Function Tests: Recent Labs  Lab 05/27/23 0112 05/28/23 1644 05/29/23 0429  AST 18 31 42*  ALT 15 14 17   ALKPHOS 68 99 76  BILITOT 0.5 0.8 2.0*  PROT 5.7* 5.3* 4.5*  ALBUMIN 3.1* 2.8* 2.3*   No results for input(s): "LIPASE", "AMYLASE" in the last 168 hours. No results for input(s): "AMMONIA" in the last 168 hours.  ABG    Component Value Date/Time   PHART 7.45 05/28/2023 2055   PCO2ART 28 (L) 05/28/2023 2055   PO2ART 132 (H) 05/28/2023 2055   HCO3 22.6 05/29/2023 0105   TCO2 25 05/13/2019 1940   ACIDBASEDEF 2.7 (H) 05/29/2023 0105   O2SAT 16.9 05/29/2023 0105     Coagulation Profile: Recent Labs  Lab 05/28/23 1644  INR 1.3*    Cardiac Enzymes: No results for input(s): "CKTOTAL", "CKMB", "CKMBINDEX", "  TROPONINI" in the last 168 hours.  HbA1C: Hgb A1c MFr Bld  Date/Time Value Ref Range Status  05/17/2023 10:00 AM 5.2 4.8 - 5.6 % Final    Comment:    (NOTE)         Prediabetes: 5.7 - 6.4         Diabetes: >6.4         Glycemic control for adults with diabetes: <7.0   01/26/2023 04:41 PM 5.1 4.8 - 5.6 % Final    Comment:    (NOTE)         Prediabetes: 5.7 - 6.4         Diabetes: >6.4         Glycemic control for adults with diabetes: <7.0      DVT/GI PRX  assessed I Assessed the need for Labs I Assessed the need for Foley I Assessed the need for Central Venous Line Family Discussion when available I Assessed the need for  Mobilization I made an Assessment of medications to be adjusted accordingly Safety Risk assessment completed  CASE DISCUSSED IN MULTIDISCIPLINARY ROUNDS WITH ICU TEAM   Critical Care Time devoted to patient care services described in this note is 65 minutes.  Critical care was necessary to treat /prevent imminent and life-threatening deterioration. Overall, patient is critically ill, prognosis is guarded.  Patient with Multiorgan failure and at high risk for cardiac arrest and death.    Lucie Leather, M.D.  [facility]  Medical Director [facility] Medical Director [facility] Cardio-Pulmonary Department

## 2023-05-29 NOTE — Plan of Care (Signed)
Patient arrived form ER intubated only responds to pain, on levo, total care.  Problem: Education: Goal: Knowledge of General Education information will improve Description: Including pain rating scale, medication(s)/side effects and non-pharmacologic comfort measures Outcome: Not Progressing   Problem: Health Behavior/Discharge Planning: Goal: Ability to manage health-related needs will improve Outcome: Not Progressing   Problem: Clinical Measurements: Goal: Ability to maintain clinical measurements within normal limits will improve Outcome: Not Progressing Goal: Will remain free from infection Outcome: Not Progressing Goal: Diagnostic test results will improve Outcome: Not Progressing Goal: Respiratory complications will improve Outcome: Not Progressing Goal: Cardiovascular complication will be avoided Outcome: Not Progressing   Problem: Activity: Goal: Risk for activity intolerance will decrease Outcome: Not Progressing   Problem: Nutrition: Goal: Adequate nutrition will be maintained Outcome: Not Progressing   Problem: Coping: Goal: Level of anxiety will decrease Outcome: Not Progressing   Problem: Elimination: Goal: Will not experience complications related to bowel motility Outcome: Not Progressing Goal: Will not experience complications related to urinary retention Outcome: Not Progressing   Problem: Pain Managment: Goal: General experience of comfort will improve Outcome: Not Progressing   Problem: Safety: Goal: Ability to remain free from injury will improve Outcome: Not Progressing   Problem: Skin Integrity: Goal: Risk for impaired skin integrity will decrease Outcome: Not Progressing

## 2023-05-29 NOTE — Progress Notes (Signed)
Attempted to obtain sputum sample per order, patient not producing sputum at this time.

## 2023-05-29 NOTE — Consult Note (Signed)
Consultation Note Date: 05/29/2023   Patient Name: Charles Sims  DOB: 1930/04/25  MRN: [id number]  Age / Sex: 89 y.o., male  PCP: Joaquim Nam, MD Referring Physician: Erin Fulling, MD  Reason for Consultation: Establishing goals of care  HPI/Patient Profile: 89 y.o. male  with past medical history of CAD, carotid artery stenosis, HLD, HTN, HF, CKD, CVA, a fib, and memory loss admitted on 05/28/2023 with progressive AMS. Patient found to be hypotensive and tachypneic upon arrival to ED - d/t concerns about ability to protect airway patient was intubated.  CTH negative, CXR clear. Labs significant for leukopenia, anemia, AKI, mild NAGMA, hypoalbuminemia with significant lactic acidosis and UTI. Found positive for E coli Bacteremia/Enterobactroles Bacteremia. Patient requiring vasopressor support. PMT consulted to discuss GOC.   Clinical Assessment and Goals of Care: I have reviewed medical records including EPIC notes, labs and imaging, received report from Dr. Belia Heman and RN, assessed the patient and then met with patient's daughter [name] and son Tammy Sours  to discuss diagnosis prognosis, GOC, EOL wishes, disposition and options.  I introduced Palliative Medicine as specialized medical care for people living with serious illness. It focuses on providing relief from the symptoms and stress of a serious illness. The goal is to improve quality of life for both the patient and the family.  We discussed a brief life review of the patient. [name] and Tammy Sours share of patient love of the outdoors - especially hunting, fishing, and being on the lake. They both reflect on his life, lived a good life. They share about his time in the [employer] serving in [place] [employer]. Patient's spouse currently unwell as well- has dementia.    We discussed patient's current illness and what it means in the larger context of patient's on-going co-morbidities.  Natural disease trajectory and  expectations at EOL were discussed. We discussed his severe infection with multiorgan failure. We discussed different types of supports Mr. Bubel is currently requiring to sustain life. Family understands severity of illness. We discuss concern patient may be at end of life, unable to survive current situation. We discuss that if patient does improve, will not return to baseline following significant illness. They understand.    The difference between aggressive medical intervention and comfort care was considered in light of the patient's goals of care. At this time, family would like to allow time for outcomes but understand poor prognosis.   We discussed what it could look like to transition to focus on comfort measures only.   Discussed with family the importance of continued conversation with family and the medical providers regarding overall plan of care and treatment options, ensuring decisions are within the context of the patient's values and GOCs.    Questions and concerns were addressed. The family was encouraged to call with questions or concerns.    Primary Decision Maker NEXT OF KIN    SUMMARY OF RECOMMENDATIONS   - DNR - time for outcomes - comfort measures introduced/questions answered - family requests time for decision making and seeing how patient does in coming days  Code Status/Advance Care Planning: DNR       Primary Diagnoses: Present on Admission:  Acute metabolic encephalopathy   I have reviewed the medical record, interviewed the patient and family, and examined the patient. The following aspects are pertinent.  Past Medical History:  Diagnosis Date   Adenoma of right adrenal gland    Arthritis    Benign prostatic hypertrophy with lower urinary tract symptoms (LUTS)  Bladder calculi    Bradycardia    CAD (coronary artery disease)    Carotid stenosis    Coronary artery disease    Dysrhythmia    bradycardia   Elevated PSA    GERD  (gastroesophageal reflux disease)    Gross hematuria    Hyperlipidemia    Hypertension    Memory loss    Prostatitis    Shortness of breath dyspnea    Urinary frequency    Social History   Socioeconomic History   Marital status: Married    Spouse name: Not on file   Number of children: Not on file   Years of education: Not on file   Highest education level: Not on file  Occupational History   Not on file  Tobacco Use   Smoking status: Former    Current packs/day: 0.00    Types: Cigarettes    Quit date: 04/04/2005    Years since quitting: 18.1   Smokeless tobacco: Never  Substance and Sexual Activity   Alcohol use: No   Drug use: No   Sexual activity: Not Currently  Other Topics Concern   Not on file  Social History Narrative   Married, lives at home with wife.  Family is helping care for patient   Social Determinants of Health   Financial Resource Strain: Not on file  Food Insecurity: No Food Insecurity (05/29/2023)   Hunger Vital Sign    Worried About Running Out of Food in the Last Year: Never true    Ran Out of Food in the Last Year: Never true  Transportation Needs: No Transportation Needs (05/28/2023)   PRAPARE - Administrator, Civil Service (Medical): No    Lack of Transportation (Non-Medical): No  Physical Activity: Not on file  Stress: Not on file  Social Connections: Not on file   Family History  Problem Relation Age of Onset   Alzheimer's disease Mother    Kidney cancer Father    Kidney cancer Paternal Grandfather    Scheduled Meds:  atorvastatin  80 mg Per Tube Daily   Chlorhexidine Gluconate Cloth  6 each Topical Daily   FLUoxetine  20 mg Per Tube Daily   hydrocortisone sod succinate (SOLU-CORTEF) inj  50 mg Intravenous Q8H   mouth rinse  15 mL Mouth Rinse Q2H   pantoprazole (PROTONIX) IV  40 mg Intravenous Q12H   Continuous Infusions:  sodium chloride Stopped (05/29/23 0255)   ceFEPime (MAXIPIME) IV     norepinephrine (LEVOPHED)  Adult infusion 10 mcg/min (05/29/23 1050)   phenylephrine (NEO-SYNEPHRINE) Adult infusion 30 mcg/min (05/29/23 1051)   sodium bicarbonate 150 mEq in dextrose 5 % 1,150 mL infusion 75 mL/hr at 05/29/23 1000   PRN Meds:.acetaminophen **OR** acetaminophen, docusate, fentaNYL (SUBLIMAZE) injection, fentaNYL (SUBLIMAZE) injection, midazolam, mouth rinse, polyethylene glycol Allergies  Allergen Reactions   Lisinopril Cough   Review of Systems  Unable to perform ROS: Intubated    Physical Exam Constitutional:      Appearance: He is ill-appearing.     Comments: opens eyes to physical stimulation  Cardiovascular:     Rate and Rhythm: Normal rate.  Pulmonary:     Comments: Vent support Skin:    General: Skin is warm and dry.     Vital Signs: BP (!) 105/57   Pulse 77   Temp 98.2 F (36.8 C) (Bladder)   Resp 11   Ht 5\' 10"  (1.778 m)   Wt 66.1 kg   SpO2 100%  BMI 20.91 kg/m  Pain Scale: CPOT       SpO2: SpO2: 100 % O2 Device:SpO2: 100 % O2 Flow Rate: .   IO: Intake/output summary:  Intake/Output Summary (Last 24 hours) at 05/29/2023 1102 Last data filed at 05/29/2023 1000 Gross per 24 hour  Intake 3751.38 ml  Output 360 ml  Net 3391.38 ml    LBM: Last BM Date : 05/29/23 Baseline Weight: Weight: 66.1 kg Most recent weight: Weight: 66.1 kg     Palliative Assessment/Data: PPS 10%     *Please note that this is a verbal dictation therefore any spelling or grammatical errors are due to the "[employer] One" system interpretation.  Gerlean Ren, DNP, AGNP-C Palliative Medicine Team [phone] Pager: [phone]

## 2023-05-29 NOTE — Progress Notes (Signed)
PHARMACY - PHYSICIAN COMMUNICATION CRITICAL VALUE ALERT - BLOOD CULTURE IDENTIFICATION (BCID)  Results for orders placed or performed during the hospital encounter of 05/28/23  Culture, blood (Routine x 2)     Status: None (Preliminary result)   Collection Time: 05/28/23  4:44 PM   Specimen: BLOOD  Result Value Ref Range Status   Specimen Description BLOOD BLOOD LEFT FOREARM  Final   Special Requests   Final    BOTTLES DRAWN AEROBIC AND ANAEROBIC Blood Culture adequate volume   Culture  Setup Time   Final    GRAM NEGATIVE RODS ANAEROBIC BOTTLE ONLY CRITICAL VALUE NOTED.  VALUE IS CONSISTENT WITH PREVIOUSLY REPORTED AND CALLED VALUE. Performed at [facility], [address] [place], [place] [address]    Culture GRAM NEGATIVE RODS  Final   Report Status PENDING  Incomplete  Culture, blood (Routine x 2)     Status: None (Preliminary result)   Collection Time: 05/28/23  4:52 PM   Specimen: BLOOD LEFT ARM  Result Value Ref Range Status   Specimen Description BLOOD LEFT ARM  Final   Special Requests   Final    BOTTLES DRAWN AEROBIC AND ANAEROBIC Blood Culture adequate volume   Culture  Setup Time   Final    Organism ID to follow GRAM NEGATIVE RODS ANAEROBIC BOTTLE ONLY Performed at [facility], [address] [place], [place] [address]    Culture GRAM NEGATIVE RODS  Final   Report Status PENDING  Incomplete  Blood Culture ID Panel (Reflexed)     Status: Abnormal   Collection Time: 05/28/23  4:52 PM  Result Value Ref Range Status   Enterococcus faecalis NOT DETECTED NOT DETECTED Final   Enterococcus Faecium NOT DETECTED NOT DETECTED Final   Listeria monocytogenes NOT DETECTED NOT DETECTED Final   Staphylococcus species NOT DETECTED NOT DETECTED Final   Staphylococcus aureus (BCID) NOT DETECTED NOT DETECTED Final   Staphylococcus epidermidis NOT DETECTED NOT DETECTED Final   Staphylococcus lugdunensis NOT DETECTED NOT DETECTED Final   Streptococcus species  NOT DETECTED NOT DETECTED Final   Streptococcus agalactiae NOT DETECTED NOT DETECTED Final   Streptococcus pneumoniae NOT DETECTED NOT DETECTED Final   Streptococcus pyogenes NOT DETECTED NOT DETECTED Final   A.calcoaceticus-baumannii NOT DETECTED NOT DETECTED Final   Bacteroides fragilis NOT DETECTED NOT DETECTED Final   Enterobacterales DETECTED (A) NOT DETECTED Final    Comment: Enterobacterales represent a large order of gram negative bacteria, not a single organism. CRITICAL RESULT CALLED TO, READ BACK BY AND VERIFIED WITH: Senai Ramnath PHARMD @0428  05/29/23 ASW    Enterobacter cloacae complex NOT DETECTED NOT DETECTED Final   Escherichia coli DETECTED (A) NOT DETECTED Final    Comment: CRITICAL RESULT CALLED TO, READ BACK BY AND VERIFIED WITH: Maite Burlison PHARMD @0428  05/29/23 ASW    Klebsiella aerogenes NOT DETECTED NOT DETECTED Final   Klebsiella oxytoca NOT DETECTED NOT DETECTED Final   Klebsiella pneumoniae NOT DETECTED NOT DETECTED Final   Proteus species NOT DETECTED NOT DETECTED Final   Salmonella species NOT DETECTED NOT DETECTED Final   Serratia marcescens NOT DETECTED NOT DETECTED Final   Haemophilus influenzae NOT DETECTED NOT DETECTED Final   Neisseria meningitidis NOT DETECTED NOT DETECTED Final   Pseudomonas aeruginosa NOT DETECTED NOT DETECTED Final   Stenotrophomonas maltophilia NOT DETECTED NOT DETECTED Final   Candida albicans NOT DETECTED NOT DETECTED Final   Candida auris NOT DETECTED NOT DETECTED Final   Candida glabrata NOT DETECTED NOT DETECTED Final  Candida krusei NOT DETECTED NOT DETECTED Final   Candida parapsilosis NOT DETECTED NOT DETECTED Final   Candida tropicalis NOT DETECTED NOT DETECTED Final   Cryptococcus neoformans/gattii NOT DETECTED NOT DETECTED Final   CTX-M ESBL NOT DETECTED NOT DETECTED Final   Carbapenem resistance IMP NOT DETECTED NOT DETECTED Final   Carbapenem resistance KPC NOT DETECTED NOT DETECTED Final   Carbapenem resistance  NDM NOT DETECTED NOT DETECTED Final   Carbapenem resist OXA 48 LIKE NOT DETECTED NOT DETECTED Final   Carbapenem resistance VIM NOT DETECTED NOT DETECTED Final    Comment: Performed at [facility], [address] [place], [place] [address]  Resp panel by RT-PCR (RSV, Flu A&B, Covid) Anterior Nasal Swab     Status: None   Collection Time: 05/28/23  5:23 PM   Specimen: Anterior Nasal Swab  Result Value Ref Range Status   SARS Coronavirus 2 by RT PCR NEGATIVE NEGATIVE Final    Comment: (NOTE) SARS-CoV-2 target nucleic acids are NOT DETECTED.  The SARS-CoV-2 RNA is generally detectable in upper respiratory specimens during the acute phase of infection. The lowest concentration of SARS-CoV-2 viral copies this assay can detect is 138 copies/mL. A negative result does not preclude SARS-Cov-2 infection and should not be used as the sole basis for treatment or other patient management decisions. A negative result may occur with  improper specimen collection/handling, submission of specimen other than nasopharyngeal swab, presence of viral mutation(s) within the areas targeted by this assay, and inadequate number of viral copies(<138 copies/mL). A negative result must be combined with clinical observations, patient history, and epidemiological information. The expected result is Negative.  Fact Sheet for Patients:  BloggerCourse.com  Fact Sheet for Healthcare Providers:  SeriousBroker.it  This test is no t yet approved or cleared by the [place] FDA and  has been authorized for detection and/or diagnosis of SARS-CoV-2 by FDA under an Emergency Use Authorization (EUA). This EUA will remain  in effect (meaning this test can be used) for the duration of the COVID-19 declaration under Section 564(b)(1) of the Act, 21 U.S.C.section 360bbb-3(b)(1), unless the authorization is terminated  or revoked sooner.       Influenza A by  PCR NEGATIVE NEGATIVE Final   Influenza B by PCR NEGATIVE NEGATIVE Final    Comment: (NOTE) The Xpert Xpress SARS-CoV-2/FLU/RSV plus assay is intended as an aid in the diagnosis of influenza from Nasopharyngeal swab specimens and should not be used as a sole basis for treatment. Nasal washings and aspirates are unacceptable for Xpert Xpress SARS-CoV-2/FLU/RSV testing.  Fact Sheet for Patients: BloggerCourse.com  Fact Sheet for Healthcare Providers: SeriousBroker.it  This test is not yet approved or cleared by the [place] FDA and has been authorized for detection and/or diagnosis of SARS-CoV-2 by FDA under an Emergency Use Authorization (EUA). This EUA will remain in effect (meaning this test can be used) for the duration of the COVID-19 declaration under Section 564(b)(1) of the Act, 21 U.S.C. section 360bbb-3(b)(1), unless the authorization is terminated or revoked.     Resp Syncytial Virus by PCR NEGATIVE NEGATIVE Final    Comment: (NOTE) Fact Sheet for Patients: BloggerCourse.com  Fact Sheet for Healthcare Providers: SeriousBroker.it  This test is not yet approved or cleared by the [place] FDA and has been authorized for detection and/or diagnosis of SARS-CoV-2 by FDA under an Emergency Use Authorization (EUA). This EUA will remain in effect (meaning this test can be used) for the duration of the COVID-19 declaration  under Section 564(b)(1) of the Act, 21 U.S.C. section 360bbb-3(b)(1), unless the authorization is terminated or revoked.  Performed at [facility], [address] [place], [place] [address]   Urine Culture     Status: None (Preliminary result)   Collection Time: 05/28/23  5:23 PM   Specimen: Urine, Catheterized  Result Value Ref Range Status   Specimen Description   Final    URINE, CATHETERIZED Performed at [facility],  [address], [place], [place] [address]    Special Requests   Final    NONE Reflexed from [id number] Performed at [facility], [address] [place], [place] [address]    Culture PENDING  Incomplete   Report Status PENDING  Incomplete  CSF culture w Gram Stain     Status: None (Preliminary result)   Collection Time: 05/28/23  8:55 PM   Specimen: CSF; Cerebrospinal Fluid  Result Value Ref Range Status   Specimen Description   Final    CSF LUMBAR PUNCTURE Performed at [facility], [address] [place], [place] [address]    Special Requests   Final    NONE Performed at [facility], [address] [place], [place] [address]    Gram Stain   Final    NO ORGANISMS SEEN WBC SEEN RED BLOOD CELLS PRESENT    Culture PENDING  Incomplete   Report Status PENDING  Incomplete  MRSA Next Gen by PCR, Nasal     Status: None   Collection Time: 05/28/23 10:25 PM   Specimen: Nasal Mucosa; Nasal Swab  Result Value Ref Range Status   MRSA by PCR Next Gen NOT DETECTED NOT DETECTED Final    Comment: (NOTE) The GeneXpert MRSA Assay (FDA approved for NASAL specimens only), is one component of a comprehensive MRSA colonization surveillance program. It is not intended to diagnose MRSA infection nor to guide or monitor treatment for MRSA infections. Test performance is not FDA approved in patients less than 38 years old. Performed at [facility] [place], [address], [place], [place] [address]     BCID Results: 2 (both anaerobic) of 4 w/ E. Coli, no resistance. Pt currently on Cefepime.  Name of provider contacted: B. Rust-Chester, NP   Changes to prescribed antibiotics required: Continue Cefepime at this time, pending trach aspirate culture results.  Otelia Sergeant, PharmD, [facility] 05/29/2023 4:48 AM

## 2023-05-29 NOTE — Progress Notes (Addendum)
Initial Nutrition Assessment  DOCUMENTATION CODES:   Severe malnutrition in context of chronic illness  INTERVENTION:   If tube feeds initiated, recommend:  Vital 1.5@50ml /hr- Initiate at 40ml/hr and increase by 15ml/hr q 8 hours until goal rate is reached.   ProSource TF 20- Give 60ml daily via tube, each supplement provides 80kcal and 20g of protein.   Free water flushes 30ml q4 hours to maintain tube patency   Regimen provides 1880kcal/day, 101g/day protein and 1047ml/day of free water.   Juven Fruit Punch BID via tube, each serving provides 95kcal and 2.5g of protein (amino acids glutamine and arginine)  Pt at high refeed risk; recommend monitor potassium, magnesium and phosphorus labs daily until stable  Daily weights   NUTRITION DIAGNOSIS:   Severe Malnutrition related to chronic illness (CVA, dementia, CHF) as evidenced by severe muscle depletion, severe fat depletion, percent weight loss.  GOAL:   Provide needs based on ASPEN/SCCM guidelines  MONITOR:   Vent status, Labs, Weight trends, I & O's, Skin  REASON FOR ASSESSMENT:   Consult Enteral/tube feeding initiation and management  ASSESSMENT:   89 y/o male with h/o BPH, Afib, HTN, CHF, dementia, CAD, CVA, GERD, CKD III and depression who is admitted with UTI, HCAP, AKI, bacteremia and septic shock.  Pt ventilated. OGT in place. Hold on tube feeds today per MD as GOC are being established. Per chart, pt is down 30lbs(17%) in < 1 year; this is significant weight loss. RD suspects pt with poor oral intake at baseline.   Medications reviewed and include: solu-cortef, protonix, cefepime, levophed, neo-synephrine, Na bicarbonate  Labs reviewed: Na 130(L), K 3.8 wnl, BUN 41(H), creat 2.17(H), P 3.3 wnl, Mg 2.2 wnl Wbc- 33.9(H), Hgb 7.5(L), Hct 22.8(L) Cbgs- 143, 145, 141 x 24 hrs  AIC 5.2- 7/12  Patient is currently intubated on ventilator support MV: 7.2 L/min Temp (24hrs), Avg:98.7 F (37.1 C), Min:97.9 F  (36.6 C), Max:102.8 F (39.3 C)  Propofol: none   MAP- >81mmHg   UOP-   NUTRITION - FOCUSED PHYSICAL EXAM:  Flowsheet Row Most Recent Value  Orbital Region Moderate depletion  Upper Arm Region Severe depletion  Thoracic and Lumbar Region Severe depletion  Buccal Region Moderate depletion  Temple Region Moderate depletion  Clavicle Bone Region Severe depletion  Clavicle and Acromion Bone Region Severe depletion  Scapular Bone Region Moderate depletion  Dorsal Hand Severe depletion  Patellar Region Severe depletion  Anterior Thigh Region Severe depletion  Posterior Calf Region Severe depletion  Edema (RD Assessment) None  Hair Reviewed  Eyes Reviewed  Mouth Reviewed  Skin Reviewed  Nails Reviewed   Diet Order:   Diet Order             Diet NPO time specified  Diet effective now                  EDUCATION NEEDS:   No education needs have been identified at this time  Skin:  Skin Assessment: Reviewed RN Assessment (wounds on vertebral column and arm)  Last BM:  7/24- type 4  Height:   Ht Readings from Last 1 Encounters:  05/29/23 5\' 10"  (1.778 m)    Weight:   Wt Readings from Last 1 Encounters:  05/29/23 66.1 kg    Ideal Body Weight:  75.45 kg  BMI:  Body mass index is 20.91 kg/m.  Estimated Nutritional Needs:   Kcal:  1850kcal/day  Protein:  100-115g/day  Fluid:  1.7-2.0L/day  Betsey Holiday MS, RD, LDN  Please refer to [facility] for RD and/or RD on-call/weekend/after hours pager

## 2023-05-30 DIAGNOSIS — Z66 Do not resuscitate: Secondary | ICD-10-CM | POA: Diagnosis not present

## 2023-05-30 DIAGNOSIS — G9341 Metabolic encephalopathy: Secondary | ICD-10-CM | POA: Diagnosis not present

## 2023-05-30 DIAGNOSIS — Z7189 Other specified counseling: Secondary | ICD-10-CM | POA: Diagnosis not present

## 2023-05-30 DIAGNOSIS — Z515 Encounter for palliative care: Secondary | ICD-10-CM | POA: Diagnosis not present

## 2023-05-30 LAB — CBC WITH DIFFERENTIAL/PLATELET
Abs Immature Granulocytes: 7.23 10*3/uL — ABNORMAL HIGH (ref 0.00–0.07)
Basophils Absolute: 0.2 10*3/uL — ABNORMAL HIGH (ref 0.0–0.1)
Basophils Relative: 1 %
Eosinophils Relative: 1 %
HCT: 21.2 % — ABNORMAL LOW (ref 39.0–52.0)
Hemoglobin: 7.3 g/dL — ABNORMAL LOW (ref 13.0–17.0)
Immature Granulocytes: 18 %
Lymphocytes Relative: 2 %
Lymphs Abs: 0.6 10*3/uL — ABNORMAL LOW (ref 0.7–4.0)
MCH: 32.6 pg (ref 26.0–34.0)
MCHC: 34.4 g/dL (ref 30.0–36.0)
Monocytes Absolute: 1.4 10*3/uL — ABNORMAL HIGH (ref 0.1–1.0)
Monocytes Relative: 4 %
Neutro Abs: 30 10*3/uL — ABNORMAL HIGH (ref 1.7–7.7)
Neutrophils Relative %: 74 %
Platelets: 237 10*3/uL (ref 150–400)
RDW: 17.9 % — ABNORMAL HIGH (ref 11.5–15.5)
Smear Review: NORMAL
WBC: 39.6 10*3/uL — ABNORMAL HIGH (ref 4.0–10.5)
nRBC: 0 % (ref 0.0–0.2)

## 2023-05-30 LAB — MAGNESIUM
Magnesium: 2 mg/dL (ref 1.7–2.4)
Magnesium: 2 mg/dL (ref 1.7–2.4)

## 2023-05-30 LAB — CULTURE, BLOOD (ROUTINE X 2): Special Requests: ADEQUATE

## 2023-05-30 LAB — BASIC METABOLIC PANEL
Anion gap: 11 (ref 5–15)
BUN: 44 mg/dL — ABNORMAL HIGH (ref 8–23)
CO2: 26 mmol/L (ref 22–32)
Calcium: 6.9 mg/dL — ABNORMAL LOW (ref 8.9–10.3)
Chloride: 94 mmol/L — ABNORMAL LOW (ref 98–111)
Creatinine, Ser: 2.14 mg/dL — ABNORMAL HIGH (ref 0.61–1.24)
GFR, Estimated: 29 mL/min — ABNORMAL LOW (ref >60)
Glucose, Bld: 148 mg/dL — ABNORMAL HIGH (ref 70–99)
Potassium: 3.9 mmol/L (ref 3.5–5.1)
Sodium: 131 mmol/L — ABNORMAL LOW (ref 135–145)

## 2023-05-30 LAB — PROCALCITONIN: Procalcitonin: 149.39 ng/mL

## 2023-05-30 LAB — CSF CULTURE W GRAM STAIN

## 2023-05-30 LAB — CULTURE, RESPIRATORY W GRAM STAIN

## 2023-05-30 LAB — GLUCOSE, CAPILLARY
Glucose-Capillary: 115 mg/dL — ABNORMAL HIGH (ref 70–99)
Glucose-Capillary: 124 mg/dL — ABNORMAL HIGH (ref 70–99)
Glucose-Capillary: 128 mg/dL — ABNORMAL HIGH (ref 70–99)
Glucose-Capillary: 134 mg/dL — ABNORMAL HIGH (ref 70–99)
Glucose-Capillary: 136 mg/dL — ABNORMAL HIGH (ref 70–99)
Glucose-Capillary: 84 mg/dL (ref 70–99)

## 2023-05-30 LAB — TROPONIN I (HIGH SENSITIVITY)
Troponin I (High Sensitivity): 105 ng/L (ref <18)
Troponin I (High Sensitivity): 101 ng/L (ref <18)

## 2023-05-30 LAB — URINE CULTURE: Culture: >=100000 — AB

## 2023-05-30 LAB — PHOSPHORUS
Phosphorus: 3.6 mg/dL (ref 2.5–4.6)
Phosphorus: 2.9 mg/dL (ref 2.5–4.6)

## 2023-05-30 MED ORDER — LACTATED RINGERS IV BOLUS
1000.0000 mL | Freq: Once | INTRAVENOUS | Status: AC
  Administered 2023-05-30: 1000 mL via INTRAVENOUS

## 2023-05-30 NOTE — Progress Notes (Signed)
Daily Progress Note   Patient Name: Charles Sims       Date: 05/30/2023 DOB: 26-Oct-1930  Age: 89 y.o. MRN#: [id number] Attending Physician: Erin Fulling, MD Primary Care Physician: Joaquim Nam, MD Admit Date: 05/28/2023  Reason for Consultation/Follow-up: Establishing goals of care  Subjective: More awake today, following some commands, currently tolerating SBT  Length of Stay: 2  Current Medications: Scheduled Meds:   atorvastatin  80 mg Per Tube Daily   Chlorhexidine Gluconate Cloth  6 each Topical Daily   feeding supplement (PROSource TF20)  60 mL Per Tube Daily   FLUoxetine  20 mg Per Tube Daily   free water  30 mL Per Tube Q4H   hydrocortisone sod succinate (SOLU-CORTEF) inj  50 mg Intravenous Q8H   insulin aspart  0-9 Units Subcutaneous Q4H   mouth rinse  15 mL Mouth Rinse Q2H   pantoprazole (PROTONIX) IV  40 mg Intravenous Q12H    Continuous Infusions:  sodium chloride Stopped (05/29/23 0255)   cefTRIAXone (ROCEPHIN)  IV Stopped (05/29/23 1638)   feeding supplement (VITAL 1.5 CAL) 30 mL/hr at 05/30/23 0555   norepinephrine (LEVOPHED) Adult infusion 9 mcg/min (05/30/23 0400)   phenylephrine (NEO-SYNEPHRINE) Adult infusion Stopped (05/30/23 0123)    PRN Meds: acetaminophen **OR** acetaminophen, docusate, fentaNYL (SUBLIMAZE) injection, fentaNYL (SUBLIMAZE) injection, midazolam, mouth rinse, polyethylene glycol  Physical Exam Constitutional:      General: He is not in acute distress.    Appearance: He is ill-appearing.  Pulmonary:     Comments: On vent, SBT, appears to be tolerating well Skin:    General: Skin is warm and dry.  Neurological:     Comments: Follows some commands             Vital Signs: BP (!) 108/53   Pulse 84   Temp 99 F (37.2 C)   Resp 16   Ht 5'  10" (1.778 m)   Wt 73.6 kg   SpO2 100%   BMI 23.28 kg/m  SpO2: SpO2: 100 % O2 Device: O2 Device: Ventilator O2 Flow Rate:    Intake/output summary:  Intake/Output Summary (Last 24 hours) at 05/30/2023 4098 Last data filed at 05/30/2023 0540 Gross per 24 hour  Intake 3829.51 ml  Output 825 ml  Net 3004.51 ml   LBM: Last BM Date : 05/29/23 Baseline Weight: Weight: 66.1 kg Most recent weight: Weight: 73.6 kg        Patient Active Problem List   Diagnosis Date Noted   Protein-calorie malnutrition, severe 05/29/2023   Generalized weakness 05/22/2023   AMS (altered mental status) 05/19/2023   Dementia without behavioral disturbance (HCC) 05/18/2023   Fall 05/17/2023   Atrial fibrillation, chronic (HCC) 05/17/2023   Hypertensive urgency 05/17/2023   HLD (hyperlipidemia) 05/17/2023   Chronic diastolic CHF (congestive heart failure) (HCC) 05/17/2023   Depression 05/17/2023   Chronic kidney disease, stage 3b (HCC) 05/17/2023   Myocardial injury 05/17/2023   Normocytic anemia 05/17/2023   Atrial fibrillation (HCC) 02/14/2023   Memory loss 02/13/2023   Malnutrition of moderate degree 01/28/2023   Stroke (cerebrum) (HCC) 01/25/2023   Weakness 01/24/2023   Hypokalemia 01/24/2023   Altered mental status 01/24/2023   Stroke (  HCC) 01/24/2023   Iron deficiency anemia due to chronic blood loss 05/14/2022   Syncope 05/08/2022   Physical deconditioning 05/08/2022   Acute metabolic encephalopathy 05/14/2019   Community acquired pneumonia of left lower lobe of lung 05/14/2019   Abdominal tenderness, left lower quadrant 05/14/2019   Bradycardia, sinus 02/13/2014   Chest pain 02/13/2014   Breathlessness on exertion 02/13/2014   Essential hypertension 02/13/2014   Carotid stenosis 02/13/2014   Benign prostatic hyperplasia with urinary obstruction 11/03/2012   Chronic prostatitis 11/03/2012   Abnormal prostate specific antigen 11/03/2012   Prostatitis 11/03/2012   Urgency of  micturation 11/03/2012   CAD S/P percutaneous coronary angioplasty 06/04/2012    Palliative Care Assessment & Plan   HPI:  89 y.o. male  with past medical history of CAD, carotid artery stenosis, HLD, HTN, HF, CKD, CVA, a fib, and memory loss admitted on 05/28/2023 with progressive AMS. Patient found to be hypotensive and tachypneic upon arrival to ED - d/t concerns about ability to protect airway patient was intubated.  CTH negative, CXR clear. Labs significant for leukopenia, anemia, AKI, mild NAGMA, hypoalbuminemia with significant lactic acidosis and UTI. Found positive for E coli Bacteremia/Enterobactroles Bacteremia. Patient requiring vasopressor support. PMT consulted to discuss GOC.   Assessment: Patient remains on one pressor, decreasing.  Remains on vent but some improvement in mental status, tolerating SBT at time of my evaluation.  Discussed with Dr. Belia Heman - may attempt extubation later today, need for clarity about whether to reintubate. Called patient's son and daughter together on speaker phone and reviewed situation. Reviewed potential of extubation later today. Discussed how to proceed if patient does not tolerate well. Family expresses they would like "one more chance" if patient fails extubation. They would want one more intubation to allow chance for improvement. Daughter and son agree to this. We reviewed making decisions for patient that are in line with his goals of care, they understand and feel this is in line with goals.   Recommendations/Plan: If fails extubation family would want another trial of ventilator support to allow time for outcomes PMT will continue to follow along and support  Code Status: DNR  Care plan was discussed with patient's son and daughter, Dr. Belia Heman  Thank you for allowing the Palliative Medicine Team to assist in the care of this patient.  *Please note that this is a verbal dictation therefore any spelling or grammatical errors are due to the  "Dragon Medical One" system interpretation.  Gerlean Ren, DNP, [facility] Palliative Medicine Team Team Phone # [phone]  Pager [phone]

## 2023-05-30 NOTE — Plan of Care (Signed)
  Problem: Clinical Measurements: Goal: Ability to maintain clinical measurements within normal limits will improve Outcome: Progressing Goal: Respiratory complications will improve Outcome: Progressing Goal: Cardiovascular complication will be avoided Outcome: Progressing   Problem: Nutrition: Goal: Adequate nutrition will be maintained Outcome: Progressing   Problem: Pain Managment: Goal: General experience of comfort will improve Outcome: Progressing   Problem: Safety: Goal: Ability to remain free from injury will improve Outcome: Progressing   Problem: Clinical Measurements: Goal: Will remain free from infection Outcome: Not Progressing   Problem: Activity: Goal: Risk for activity intolerance will decrease Outcome: Not Progressing

## 2023-05-30 NOTE — TOC Initial Note (Signed)
Transition of Care [facility]) - Initial/Assessment Note    Patient Details  Name: Charles Sims MRN: [id number] Date of Birth: 13-Feb-1930  Transition of Care [facility]) CM/SW Contact:    Kreg Shropshire, RN Phone Number: 05/30/2023, 8:41 AM  Clinical Narrative:                 Cm assessed pt for TOC needs. Prevention readmission assessment completed. Pt was long term resident at [facility]. Cm spoke with Tiffany at [facility] and they agreed to take him back. Cm also spoke with daughter [name] and she agreed to let him go back to [facility]. Pt is DNR and still intubated. [facility] provides transportation to appts. He had all equipment at [facility] to get around. Pt has PCP and receives all medications at [facility] pharmacy. Cm will continue to follow for toc needs at this time  Expected Discharge Plan: Skilled Nursing Facility Barriers to Discharge: Continued Medical Work up   Patient Goals and CMS Choice     Choice offered to / list presented to : Adult Children [place] ownership interest in [facility].provided to:: Adult Children    Expected Discharge Plan and Services       Living arrangements for the past 2 months: Skilled Nursing Facility                                      Prior Living Arrangements/Services Living arrangements for the past 2 months: Skilled Nursing Facility                Current home services: DME    Activities of Daily Living Home Assistive Devices/Equipment: Eyeglasses, Environmental consultant (specify type) ADL Screening (condition at time of admission) Patient's cognitive ability adequate to safely complete daily activities?: No Is the patient deaf or have difficulty hearing?: No Does the patient have difficulty seeing, even when wearing glasses/contacts?: No Does the patient have difficulty concentrating, remembering, or making decisions?: Yes Patient able to express need for assistance with ADLs?: No Does the patient have  difficulty dressing or bathing?: Yes Independently performs ADLs?: No Communication: Dependent Is this a change from baseline?: Pre-admission baseline Dressing (OT): Dependent Is this a change from baseline?: Pre-admission baseline Grooming: Dependent Is this a change from baseline?: Pre-admission baseline Feeding: Dependent Is this a change from baseline?: Pre-admission baseline Bathing: Dependent Is this a change from baseline?: Pre-admission baseline Toileting: Dependent Is this a change from baseline?: Pre-admission baseline In/Out Bed: Dependent Is this a change from baseline?: Pre-admission baseline Walks in Home: Dependent Is this a change from baseline?: Pre-admission baseline Does the patient have difficulty walking or climbing stairs?: Yes Weakness of Legs: Both Weakness of Arms/Hands: Both  Permission Sought/Granted                  Emotional Assessment              Admission diagnosis:  Acute metabolic encephalopathy [G93.41] Patient Active Problem List   Diagnosis Date Noted   Protein-calorie malnutrition, severe 05/29/2023   Generalized weakness 05/22/2023   AMS (altered mental status) 05/19/2023   Dementia without behavioral disturbance (HCC) 05/18/2023   Fall 05/17/2023   Atrial fibrillation, chronic (HCC) 05/17/2023   Hypertensive urgency 05/17/2023   HLD (hyperlipidemia) 05/17/2023   Chronic diastolic CHF (congestive heart failure) (HCC) 05/17/2023   Depression 05/17/2023   Chronic kidney disease, stage 3b (HCC)  05/17/2023   Myocardial injury 05/17/2023   Normocytic anemia 05/17/2023   Atrial fibrillation (HCC) 02/14/2023   Memory loss 02/13/2023   Malnutrition of moderate degree 01/28/2023   Stroke (cerebrum) (HCC) 01/25/2023   Weakness 01/24/2023   Hypokalemia 01/24/2023   Altered mental status 01/24/2023   Stroke (HCC) 01/24/2023   Iron deficiency anemia due to chronic blood loss 05/14/2022   Syncope 05/08/2022   Physical  deconditioning 05/08/2022   Acute metabolic encephalopathy 05/14/2019   Community acquired pneumonia of left lower lobe of lung 05/14/2019   Abdominal tenderness, left lower quadrant 05/14/2019   Bradycardia, sinus 02/13/2014   Chest pain 02/13/2014   Breathlessness on exertion 02/13/2014   Essential hypertension 02/13/2014   Carotid stenosis 02/13/2014   Benign prostatic hyperplasia with urinary obstruction 11/03/2012   Chronic prostatitis 11/03/2012   Abnormal prostate specific antigen 11/03/2012   Prostatitis 11/03/2012   Urgency of micturation 11/03/2012   CAD S/P percutaneous coronary angioplasty 06/04/2012   PCP:  Joaquim Nam, MD Pharmacy:   [facility] [address] [place] - [address] [place] [address] Phone: [phone] Fax: [phone]  [facility] - [place], [place] - [address] [place] [address] Phone: [phone] Fax: [phone]     Social Determinants of Health (SDOH) Social History: SDOH Screenings   Food Insecurity: No Food Insecurity (05/29/2023)  Housing: Low Risk  (05/28/2023)  Transportation Needs: No Transportation Needs (05/28/2023)  Utilities: Not At Risk (05/28/2023)  Depression (PHQ2-9): Low Risk  (05/14/2022)  Tobacco Use: Medium Risk (05/28/2023)   SDOH Interventions:     Readmission Risk Interventions    05/30/2023    8:37 AM  Readmission Risk Prevention Plan  Transportation Screening Complete  Medication Review (RN Care Manager) Referral to Pharmacy  PCP or Specialist appointment within 3-5 days of discharge Complete  HRI or Home Care Consult Complete  SW Recovery Care/Counseling Consult Complete  Palliative Care Screening Complete  Skilled Nursing Facility Complete

## 2023-05-30 NOTE — Progress Notes (Signed)
PHARMACY CONSULT NOTE  Pharmacy Consult for Electrolyte Monitoring and Replacement   Recent Labs: Potassium (mmol/L)  Date Value  05/30/2023 3.9  10/15/2012 4.0   Magnesium (mg/dL)  Date Value  09/81/1914 2.0   Calcium (mg/dL)  Date Value  78/29/5621 6.9 (L)   Albumin (g/dL)  Date Value  30/86/5784 2.3 (L)   Phosphorus (mg/dL)  Date Value  69/62/9528 3.6   Sodium (mmol/L)  Date Value  05/30/2023 131 (L)   Assessment: 89 y/o male with h/o BPH, Afib, HTN, CHF, dementia, CAD, CVA, GERD, CKD III and depression who is admitted with UTI, HCAP, AKI, bacteremia and septic shock. Pharmacy is asked to follow and replace electrolytes while in CCU  Nutrition: Vital 1.5 at 30 mL/hr + FWF 30 mL q4h  Goal of Therapy:  Electrolytes WNL  Plan:  ---no electrolyte replacement warranted for today ---recheck electrolytes in am  Lowella Bandy ,PharmD Clinical Pharmacist 05/30/2023 7:15 AM

## 2023-05-30 NOTE — Progress Notes (Signed)
NAME:  Charles Sims, MRN:  [id number], DOB:  May 16, 1930, LOS: 2 ADMISSION DATE:  05/28/2023, CONSULTATION DATE:  05/28/23 REFERRING MD:  Dr. Rosalia Hammers,   CHIEF COMPLAINT: Mental status changes  History of Present Illness:  89 yo M presenting to [facility] ED from [facility] via EMS for evaluation of AMS.  History provided per chart review and daughter, Charles Sims's, supplementation via telephone report as patient is intubated and unresponsive at this time.  Patient was recently discharged on 05/22/2023, at which point he was transferred to [facility] after PT/OT had recommended SNF placement.  This recent admission was after a fall and during admission he was noted to have a significant hemoglobin drop but family declined further GI workup due to overall decline.  At time of admission so reported baseline mentation to be ability to recognize family members, intermittent confusion- not oriented to place/time. Patient was also seen on 05/27/2023 in the ED due to concern of suspected hematemesis. On 7/22 Hgb was 7.1, no signs of bleeding noted, negative rectal exam.  At discharge, he was reportedly back to baseline then over the last several days (7/20-7/21), family reports mentation as becoming progressively worse. They deny any other complaints. No complaints of dyspnea, productive cough, urinary symptoms, falls, nausea/vomiting/diarrhea, chest pain per patient or SNF staff. Patient does have baseline tremors that have been worsening over the last few years. Per EMS the SNF staff were concerned feeling the patient's tremors had worsened as well as becoming progressively altered.  EMS reported difficulty obtaining in SpO2 reading but that he was 100% after being placed on a nonrebreather for transport.  ED course: Upon arrival patient GCS 3-4, febrile, hypotensive & tachypneic with what appeared to be rigors. Sepsis protocol initiated with empiric antibiotics & IVF resuscitation. Oxygenation stable but  mentation unimproved while in ED. Lengthy GOC discussion between EDP & daughter, Charles Sims. Patient confirmed to be DNR but while still wanting all other needed interventions including mechanical intubation and ventilatory support. Patient urgently intubated due to inability to protect airway. Case discussed with Neurology on call, patient not a candidate for continuous EEG monitoring as tremors felt to be rigors due to temperature. Initial CTH negative, CXR clear. Labs significant for leukopenia, anemia, AKI, mild NAGMA, hypoalbuminemia with significant lactic acidosis and UTI. Medications given: acetaminophen, decadron, etomidate/ versed/ rocuronium, cefepime/ flagyl/ vancomycin, 2.25 L IVF bolus Initial Vitals: 102.8/ 33/ 120/ 76/56 & 97% on RA EKG Interpretation: Date: 05/28/23, EKG Time: 21:26, Rate: 85, Rhythm: NSR, QRS Axis: normal Intervals: prolonged QR, ST/T Wave abnormalities: none, Narrative Interpretation: NSR with prolonged QT Chemistry: Na+: 137, K+: 4.7, BUN/Cr.: 36/2.20, Serum CO2/ AG: 20/13, albumin: 2.8, glucose: 78 Hematology: WBC: 3.2, Hgb: 6.6,  Lactic/ PCT: 7.7 > 8.3/ 23.22, COVID-19 & Influenza A/B: negative UA: +mod Hgb, +large leuks, proteinuria, >50 WBC's  LP: pending ABG: 7.45/ 28/ 132/ 19.5 CXR 05/28/23: low lung volumes without acute or active cardiopulmonary disease. CT head wo contrast 05/28/23: no acute intracranial abnormality. Atrophy and advance chronic small vessel ischemic changes of the white matter. CT chest wo contrast 05/28/23: Trace bilateral pleural effusions. Patchy airspace opacities in the inferior right lower lobe worrisome for pneumonia. Acute/subacute right posterior sixth through tenth rib fractures. Mild cardiomegaly. CT angio GIB 05/28/23: Aortoiliac atherosclerosis.  No aneurysm or dissection. No contrast extravasation to localize GI bleed. Small amount of pericholecystic fluid. No visible stones or gallbladder wall thickening. Bladder wall thickening  likely related to bladder outlet obstruction. Large stool burden in  the rectosigmoid colon. Trace bilateral effusions, bibasilar atelectasis.  PCCM consulted for admission due to acute encephalopathy with inability to protect airway requiring urgent mechanical intubation and ventilatory support in the setting of suspected severe urosepsis.  Pertinent  Medical History  Bradycardia CAD Carotid artery stenosis HLD HTN Prostatitis HFpEF CKD Stage 3b BPH CVA Atrial Fibrillation not on anticoagulation Anemia Memory loss Significant Hospital Events: Including procedures, antibiotic start and stop dates in addition to other pertinent events   05/28/23: Admit to ICU with acute encephalopathy and inability to protect airway requiring urgent EGD mechanical intubation and ventilatory support in the setting of suspected severe urosepsis. 7/24 severe septic shock, multiorgan failure +E coli Bacteremia/Enterobactroles Bacteremia  Interim History / Subjective:  Severe metabolic encephalopathy Severe resp failure Severe septic shock on multiple pressors +multiorgan failure Patient is in the dying process   Objective   Blood pressure (!) 108/53, pulse 84, temperature 99 F (37.2 C), resp. rate 16, height 5\' 10"  (1.778 m), weight 73.6 kg, SpO2 100%.    Vent Mode: PRVC FiO2 (%):  [30 %] 30 % Set Rate:  [12 bmp] 12 bmp Vt Set:  [450 mL] 450 mL PEEP:  [5 cmH20] 5 cmH20 Plateau Pressure:  [10 cmH20-13 cmH20] 12 cmH20   Intake/Output Summary (Last 24 hours) at 05/30/2023 1478 Last data filed at 05/30/2023 0540 Gross per 24 hour  Intake 3964.37 ml  Output 865 ml  Net 3099.37 ml   Filed Weights   05/28/23 2215 05/29/23 0258 05/30/23 0407  Weight: 66.1 kg 66.1 kg 73.6 kg       REVIEW OF SYSTEMS  PATIENT IS UNABLE TO PROVIDE COMPLETE REVIEW OF SYSTEMS DUE TO SEVERE CRITICAL ILLNESS   PHYSICAL EXAMINATION:  GENERAL:critically ill appearing, +resp distress EYES: Pupils equal, round,  reactive to light.  No scleral icterus.  MOUTH: Moist mucosal membrane. INTUBATED NECK: Supple.  PULMONARY: Lungs clear to auscultation, +rhonchi, +wheezing CARDIOVASCULAR: S1 and S2.  Regular rate and rhythm GASTROINTESTINAL: Soft, nontender, -distended. Positive bowel sounds.  MUSCULOSKELETAL: No swelling, clubbing, or edema.  NEUROLOGIC: obtunded,sedated SKIN:normal, warm to touch, Capillary refill delayed  Pulses present bilaterally    Assessment & Plan:  89 yo white male admitted to ICU for acute and severe Acute metabolic encephalopathy with severe resp failure due to inability to protect airway requiring mechanical ventilatory support due to severe acidosis and septic shock from GRAM NEGATIVE BACTEREMIA with progressive renal failure and liver dysfunction   Severe ACUTE Hypoxic and Hypercapnic Respiratory Failure -continue Mechanical Ventilator support -Wean Fio2 and PEEP as tolerated -VAP/VENT bundle implementation - Wean PEEP & FiO2 as tolerated, maintain SpO2 > 88% - Head of bed elevated 30 degrees, VAP protocol in place - Plateau pressures less than 30 cm H20  - Intermittent chest x-ray & ABG PRN - Ensure adequate pulmonary hygiene  Unable to wean due to severe metabolic encephalopathy  SEPTIC shock SOURCE-UTI & HCAP -follow ABG and LA as needed -consider stress dose steroids -aggressive IV fluid Resuscitation Lumbar puncture performed with initial findings not suggestive of meningitis - f/u cultures, trend lactic/ PCT - Daily CBC, monitor WBC/ fever curve - IV antibiotics:Rocephin - Continue vasopressors to maintain MAP< 65: norepinephrine - Persistent hypotension consider stress dose steroids  - hold outpatient antihypertensive medications    NEUROLOGY ACUTE METABOLIC ENCEPHALOPATHY due to severe septic shock and acidosis Avoid sedatives CVA- history of Dementia history CTH negative, recent MRI negative  ACUTE KIDNEY INJURY/Renal Failure -continue Foley  Catheter-assess need -Avoid nephrotoxic agents -Follow  urine output, BMP -Ensure adequate renal perfusion, optimize oxygenation -Renal dose medications   Intake/Output Summary (Last 24 hours) at 05/30/2023 0714 Last data filed at 05/30/2023 0540 Gross per 24 hour  Intake 3964.37 ml  Output 865 ml  Net 3099.37 ml      Latest Ref Rng & Units 05/30/2023    4:38 AM 05/29/2023   11:14 AM 05/29/2023    4:29 AM  BMP  Glucose 70 - 99 mg/dL 213  086  578   BUN 8 - 23 mg/dL 44  41  40   Creatinine 0.61 - 1.24 mg/dL 4.69  6.29  5.28   Sodium 135 - 145 mmol/L 131  130  132   Potassium 3.5 - 5.1 mmol/L 3.9  3.8  4.2   Chloride 98 - 111 mmol/L 94  99  100   CO2 22 - 32 mmol/L 26  19  21    Calcium 8.9 - 10.3 mg/dL 6.9  7.3  7.4        Acute on Chronic Anemia unknown etiology in the setting of chronic disease CT angio GIB negative, no signs of bleeding currently - continue with 1 unit of PRBC's > f/u H&H post - Monitor for s/s of bleeding - Daily CBC - Transfuse for Hgb <7 - PPI BID - hold Sq heparin overnight, consider starting DVT prophylaxis in AM - SCD's - consider Gastroenterology follow-up as indicated   ENDO - ICU hypoglycemic\Hyperglycemia protocol -check FSBS per protocol   GI GI PROPHYLAXIS as indicated  NUTRITIONAL STATUS DIET-->TF's as tolerated Constipation protocol as indicated   ELECTROLYTES -follow labs as needed -replace as needed -pharmacy consultation and following  Best Practice (right click and "Reselect all SmartList Selections" daily)  Diet/type: NPO w/ meds via tube DVT prophylaxis: SCD GI prophylaxis: PPI Lines: N/A Foley:  Yes, and it is still needed Code Status:  DNR Last date of multidisciplinary goals of care discussion [05/28/23] Daughter Charles Sims had extensive GOC discussion with EDP whoconfirmed. DNR with all other interventions at this time. Grave prognosis dicussed.  Labs   CBC: Recent Labs  Lab 05/27/23 0112 05/28/23 1644  05/28/23 1711 05/29/23 0430 05/30/23 0438  WBC 9.1 3.2* 3.5* 33.9* 39.6*  NEUTROABS  --  2.2 2.3  --  30.0*  HGB 7.1* 6.6* 6.4* 7.5* 7.3*  HCT 21.6* 20.7* 18.7* 22.8* 21.2*  MCV 100.5* 105.1* 97.9 98.3 94.6  PLT 502* 335 321 291 237    Basic Metabolic Panel: Recent Labs  Lab 05/27/23 0112 05/28/23 1644 05/29/23 0429 05/29/23 1114 05/30/23 0438  NA 135 137 132* 130* 131*  K 4.3 4.7 4.2 3.8 3.9  CL 104 104 100 99 94*  CO2 23 20* 21* 19* 26  GLUCOSE 95 78 171* 150* 148*  BUN 28* 36* 40* 41* 44*  CREATININE 1.71* 2.20* 2.29* 2.17* 2.14*  CALCIUM 8.1* 7.9* 7.4* 7.3* 6.9*  MG  --  1.5* 2.2  --  2.0  PHOS  --  3.0 3.3  --  3.6   GFR: Estimated Creatinine Clearance: 23.2 mL/min (A) (by C-G formula based on SCr of 2.14 mg/dL (H)). Recent Labs  Lab 05/28/23 1644 05/28/23 1711 05/28/23 1853 05/29/23 0429 05/29/23 0430 05/29/23 1114 05/29/23 1848 05/29/23 2202 05/30/23 0438  PROCALCITON 23.22  --   --  >150.00  --   --   --   --   --   WBC 3.2* 3.5*  --   --  33.9*  --   --   --  39.6*  LATICACIDVEN 7.7*  --    < > 5.8*  --  4.8* 3.3* 2.4*  --    < > = values in this interval not displayed.    Liver Function Tests: Recent Labs  Lab 05/27/23 0112 05/28/23 1644 05/29/23 0429  AST 18 31 42*  ALT 15 14 17   ALKPHOS 68 99 76  BILITOT 0.5 0.8 2.0*  PROT 5.7* 5.3* 4.5*  ALBUMIN 3.1* 2.8* 2.3*   No results for input(s): "LIPASE", "AMYLASE" in the last 168 hours. No results for input(s): "AMMONIA" in the last 168 hours.  ABG    Component Value Date/Time   PHART 7.45 05/28/2023 2055   PCO2ART 28 (L) 05/28/2023 2055   PO2ART 132 (H) 05/28/2023 2055   HCO3 22.6 05/29/2023 0105   TCO2 25 05/13/2019 1940   ACIDBASEDEF 2.7 (H) 05/29/2023 0105   O2SAT 16.9 05/29/2023 0105     Coagulation Profile: Recent Labs  Lab 05/28/23 1644  INR 1.3*    Cardiac Enzymes: No results for input(s): "CKTOTAL", "CKMB", "CKMBINDEX", "TROPONINI" in the last 168  hours.  HbA1C: Hgb A1c MFr Bld  Date/Time Value Ref Range Status  05/17/2023 10:00 AM 5.2 4.8 - 5.6 % Final    Comment:    (NOTE)         Prediabetes: 5.7 - 6.4         Diabetes: >6.4         Glycemic control for adults with diabetes: <7.0   01/26/2023 04:41 PM 5.1 4.8 - 5.6 % Final    Comment:    (NOTE)         Prediabetes: 5.7 - 6.4         Diabetes: >6.4         Glycemic control for adults with diabetes: <7.0       DVT/GI PRX  assessed I Assessed the need for Labs I Assessed the need for Foley I Assessed the need for Central Venous Line Family Discussion when available I Assessed the need for Mobilization I made an Assessment of medications to be adjusted accordingly Safety Risk assessment completed  CASE DISCUSSED IN MULTIDISCIPLINARY ROUNDS WITH ICU TEAM     Critical Care Time devoted to patient care services described in this note is 55 minutes.  Critical care was necessary to treat /prevent imminent and life-threatening deterioration. Overall, patient is critically ill, prognosis is guarded.  Patient with Multiorgan failure and at high risk for cardiac arrest and death.    Lucie Leather, M.D.  [facility]  Medical Director [facility] Medical Director [facility] Cardio-Pulmonary Department

## 2023-05-31 DIAGNOSIS — Z515 Encounter for palliative care: Secondary | ICD-10-CM | POA: Diagnosis not present

## 2023-05-31 DIAGNOSIS — G9341 Metabolic encephalopathy: Secondary | ICD-10-CM | POA: Diagnosis not present

## 2023-05-31 DIAGNOSIS — Z7189 Other specified counseling: Secondary | ICD-10-CM | POA: Diagnosis not present

## 2023-05-31 DIAGNOSIS — A419 Sepsis, unspecified organism: Secondary | ICD-10-CM | POA: Diagnosis not present

## 2023-05-31 LAB — GLUCOSE, CAPILLARY
Glucose-Capillary: 104 mg/dL — ABNORMAL HIGH (ref 70–99)
Glucose-Capillary: 114 mg/dL — ABNORMAL HIGH (ref 70–99)
Glucose-Capillary: 122 mg/dL — ABNORMAL HIGH (ref 70–99)
Glucose-Capillary: 87 mg/dL (ref 70–99)
Glucose-Capillary: 89 mg/dL (ref 70–99)
Glucose-Capillary: 91 mg/dL (ref 70–99)
Glucose-Capillary: 94 mg/dL (ref 70–99)
Glucose-Capillary: 96 mg/dL (ref 70–99)

## 2023-05-31 MED ORDER — POLYETHYLENE GLYCOL 3350 17 G PO PACK: 17.0000 g | PACK | Freq: Every day | ORAL | Status: DC | PRN

## 2023-05-31 MED ORDER — DOCUSATE SODIUM 100 MG PO CAPS: 100.0000 mg | ORAL_CAPSULE | Freq: Two times a day (BID) | ORAL | Status: DC | PRN

## 2023-05-31 MED ORDER — HALOPERIDOL LACTATE 5 MG/ML IJ SOLN
1.0000 mg | Freq: Once | INTRAMUSCULAR | Status: AC
  Administered 2023-05-31: 1 mg via INTRAVENOUS
  Filled 2023-05-31: qty 1

## 2023-05-31 MED ORDER — NEPRO/CARBSTEADY PO LIQD: 237.0000 mL | Freq: Three times a day (TID) | ORAL | Status: DC

## 2023-05-31 MED ORDER — DIAZEPAM 5 MG/ML IJ SOLN
2.5000 mg | INTRAMUSCULAR | Status: DC | PRN
  Administered 2023-05-31 – 2023-06-01 (×4): 2.5 mg via INTRAVENOUS
  Filled 2023-05-31 (×5): qty 2

## 2023-05-31 MED ORDER — ATORVASTATIN CALCIUM 20 MG PO TABS
80.0000 mg | ORAL_TABLET | Freq: Every day | ORAL | Status: DC
  Filled 2023-05-31: qty 4

## 2023-05-31 MED ORDER — DIAZEPAM 5 MG/ML IJ SOLN
2.5000 mg | Freq: Three times a day (TID) | INTRAMUSCULAR | Status: DC | PRN
  Administered 2023-05-31: 2.5 mg via INTRAVENOUS
  Filled 2023-05-31: qty 2

## 2023-05-31 MED ORDER — FLUOXETINE HCL 20 MG PO CAPS
20.0000 mg | ORAL_CAPSULE | Freq: Every day | ORAL | Status: DC
  Filled 2023-05-31: qty 1

## 2023-05-31 MED ORDER — VITAMIN C 500 MG PO TABS
250.0000 mg | ORAL_TABLET | Freq: Two times a day (BID) | ORAL | Status: DC
  Filled 2023-05-31: qty 1

## 2023-05-31 MED ORDER — ACETAMINOPHEN 650 MG RE SUPP: 650.0000 mg | Freq: Four times a day (QID) | RECTAL | Status: DC | PRN

## 2023-05-31 MED ORDER — ACETAMINOPHEN 325 MG PO TABS: 650.0000 mg | ORAL_TABLET | Freq: Four times a day (QID) | ORAL | Status: DC | PRN

## 2023-05-31 MED ORDER — ADULT MULTIVITAMIN W/MINERALS CH
1.0000 | ORAL_TABLET | Freq: Every day | ORAL | Status: DC
  Filled 2023-05-31: qty 1

## 2023-05-31 NOTE — Evaluation (Signed)
Clinical/Bedside Swallow Evaluation Patient Details  Name: Charles Sims MRN: [id number] Date of Birth: 07/30/1930  Today's Date: 05/31/2023 Time: SLP Start Time (ACUTE ONLY): 0855 SLP Stop Time (ACUTE ONLY): 0945 SLP Time Calculation (min) (ACUTE ONLY): 50 min  Past Medical History:  Past Medical History:  Diagnosis Date   Adenoma of right adrenal gland    Arthritis    Benign prostatic hypertrophy with lower urinary tract symptoms (LUTS)    Bladder calculi    Bradycardia    CAD (coronary artery disease)    Carotid stenosis    Coronary artery disease    Dysrhythmia    bradycardia   Elevated PSA    GERD (gastroesophageal reflux disease)    Gross hematuria    Hyperlipidemia    Hypertension    Memory loss    Prostatitis    Shortness of breath dyspnea    Urinary frequency    Past Surgical History:  Past Surgical History:  Procedure Laterality Date   CAROTID ENDARTERECTOMY Left    CYSTOSCOPY WITH LITHOLAPAXY N/A 04/13/2015   Procedure: CYSTOSCOPY WITH LITHOLAPAXY;  Surgeon: Vanna Scotland, MD;  Location: [facility] ORS;  Service: Urology;  Laterality: N/A;   HPI:  Pt is a 89 yo M presenting to [facility] ED from [facility] via EMS for evaluation of AMS.     History provided per chart review and daughter, [name]'s, supplementation via telephone report as patient is intubated and unresponsive at admit.   Patient was recently discharged on 05/22/2023, at which point he was transferred to [facility] after PT/OT had recommended SNF placement.  This recent admission was after a fall and during admission he was noted to have a significant hemoglobin drop but family declined further GI workup due to overall decline.  At time of admission, Son reported baseline mentation to be ability to recognize family members, intermittent confusion- not oriented to place/time.  Patient was also seen on 05/27/2023 in the ED due to concern of suspected hematemesis. On 7/22 Hgb was 7.1, no signs of bleeding noted,  negative rectal exam.  At discharge, he was reportedly back to baseline then over the last several days (7/20-7/21) at Facility, family reports mentation as becoming progressively worse.  No complaints of dyspnea, productive cough, urinary symptoms, falls, nausea/vomiting/diarrhea, chest pain SNF staff. Patient does have baseline tremors that have been worsening over the last few years.  Per EMS the SNF staff were concerned feeling the patient's tremors had worsened as well as becoming progressively altered.  EMS reported difficulty obtaining in SpO2 reading but that he was 100% after being placed on a nonrebreather for transport.  Patient confirmed to be DNR but while still wanting all other needed interventions including mechanical intubation and ventilatory support, so patient urgently intubated due to inability to protect airway at admit -- extubated early AM on 7/26.  Case discussed with Neurology on call, patient not a candidate for continuous EEG monitoring as tremors felt to be rigors due to temperature. Initial CTH negative, CXR Clear.  Labs significant for leukopenia, anemia, AKI, mild NAGMA, hypoalbuminemia with significant lactic acidosis and UTI.   Chest CT at admit:  Trace bilateral pleural effusions.  2. Patchy airspace opacities in the inferior right lower lobe  worrisome for pneumonia.  3. Acute/subacute right posterior sixth through tenth rib fractures.  4. Mild cardiomegaly.    Assessment / Plan / Recommendation  Clinical Impression   Pt seen for BSE today. Pt awake, resting in bed. Oriented to name and  DOB. St[place] He required support w/ positioning in bed; overall weakness, confusion. Seemed uncomfortable in general but stated no pain. Recent extubation early this AM post ~2.5 days of oral intubation. Vocal quality slightly gravely, low volume and mumbled speech in general. On RA, afebrile.    Pt appears to present w/ potential pharyngeal phase dysphagia w/  suspected impact from critical illness requiring brief oral intubation/extubation at admit; also Baseline Dementia. ANY Cognitive decline can impact overall awareness/timing of swallow which can increase risk for aspiration. Pt also required min-mod tactile/verbal/visual cues for follow through during bolus presentation and tasks and w/ self-feeding support. He stated some interest in "something to drink" but only accepted a few po trials overall.   Post positioning upright in bed, pt consumed trials of single ice chips (w/ throat clearing x1/3 trials), Nectar liquids via tsp/cup, and puree trials mostly fed to him w/ hand over hand support. No other overt clinical s/s of aspiration noted w/ trials: no cough, no wet vocal quality, and no decline in respiratory presentation noted during/post trials -- O2 sats remained 98-99%. Oral phase was c/b functional and timely bolus management, A-P transfer, and oral clearing of boluses given; no solid foods were assessed this evaluation d/t recent extubation and Cognitive status(ongoing assessment). He required support and redirection w/ po tasks.  OM exam was cursory d/t pt's reduced follow through, but no unilateral weakness was noted. He accepted min oral care given prior to po's.    Recommend initial diet of dysphagia level 1 diet w/ gravies w/ Nectar consistency liquids; aspiration precautions; feeding support and Supervision at meals, reduce Distractions during meals and given Time b/t bites/sips. Pills Crushed in puree for safety. Oral Care. ST services will continue to follow for ongoing assessment and trials to upgrade diet if appropriate. Recommend Dieticiand and Palliative Care f/u while admitted. NSG/MD updated, agreed. Precautions posted in room.  SLP Visit Diagnosis: Dysphagia, pharyngeal phase (R13.13) (recent extubation)    Aspiration Risk  Mild aspiration risk;Moderate aspiration risk;Risk for inadequate nutrition/hydration    Diet Recommendation    Nectar;Dysphagia 1 (puree) = initial diet of dysphagia level 1 diet w/ gravies w/ Nectar consistency liquids; aspiration precautions; feeding support and Supervision at meals, reduce Distractions during meals and given Time b/t bites/sips.  Medication Administration: Crushed with puree    Other  Recommendations Recommended Consults:  (Dietician; Palliative Care) Oral Care Recommendations: Oral care BID;Oral care before and after PO;Staff/trained caregiver to provide oral care Caregiver Recommendations: Avoid jello, ice cream, thin soups, popsicles;Remove water pitcher;Have oral suction available    Recommendations for follow up therapy are one component of a multi-disciplinary discharge planning process, led by the attending physician.  Recommendations may be updated based on patient status, additional functional criteria and insurance authorization.  Follow up Recommendations Follow physician's recommendations for discharge plan and follow up therapies      Assistance Recommended at Discharge  FULL d/t Dementia  Functional Status Assessment  (TBD)  Frequency and Duration min 2x/week  2 weeks       Prognosis Prognosis for improved oropharyngeal function: Fair Barriers to Reach Goals: Cognitive deficits;Time post onset;Severity of deficits Barriers/Prognosis Comment: Advanced age; Baseline Dementia; Chronic Illness      Swallow Study   General Date of Onset: 05/28/23 HPI: Pt is a 89 yo M presenting to [facility] ED from [facility] via EMS for evaluation of AMS.     History provided per chart review and daughter, [name]'s, supplementation via  telephone report as patient is intubated and unresponsive at admit.   Patient was recently discharged on 05/22/2023, at which point he was transferred to [facility] after PT/OT had recommended SNF placement.  This recent admission was after a fall and during admission he was noted to have a significant hemoglobin drop but family declined further GI  workup due to overall decline.  At time of admission, Son reported baseline mentation to be ability to recognize family members, intermittent confusion- not oriented to place/time.  Patient was also seen on 05/27/2023 in the ED due to concern of suspected hematemesis. On 7/22 Hgb was 7.1, no signs of bleeding noted, negative rectal exam.  At discharge, he was reportedly back to baseline then over the last several days (7/20-7/21) at Facility, family reports mentation as becoming progressively worse.  No complaints of dyspnea, productive cough, urinary symptoms, falls, nausea/vomiting/diarrhea, chest pain SNF staff. Patient does have baseline tremors that have been worsening over the last few years.  Per EMS the SNF staff were concerned feeling the patient's tremors had worsened as well as becoming progressively altered.  EMS reported difficulty obtaining in SpO2 reading but that he was 100% after being placed on a nonrebreather for transport.  Patient confirmed to be DNR but while still wanting all other needed interventions including mechanical intubation and ventilatory support, so patient urgently intubated due to inability to protect airway at admit -- extubated early AM on 7/26.  Case discussed with Neurology on call, patient not a candidate for continuous EEG monitoring as tremors felt to be rigors due to temperature. Initial CTH negative, CXR Clear.  Labs significant for leukopenia, anemia, AKI, mild NAGMA, hypoalbuminemia with significant lactic acidosis and UTI.   Chest CT at admit:  Trace bilateral pleural effusions.  2. Patchy airspace opacities in the inferior right lower lobe  worrisome for pneumonia.  3. Acute/subacute right posterior sixth through tenth rib fractures.  4. Mild cardiomegaly. Type of Study: Bedside Swallow Evaluation Previous Swallow Assessment: BSE in 2020- regular diet, thins;  Cog-language evel 2024- cognitive-comm. decline Diet Prior to this Study: NPO Temperature Spikes Noted:  No (wbc elevated) Respiratory Status: Room air History of Recent Intubation: Yes Total duration of intubation (days): 2 days Date extubated: 05/31/23 Behavior/Cognition: Alert;Cooperative;Pleasant mood;Confused;Distractible;Requires cueing Oral Cavity Assessment: Dry Oral Care Completed by SLP: Yes Oral Cavity - Dentition: Missing dentition Vision: Functional for self-feeding (helped to hold cup) Self-Feeding Abilities: Needs assist;Needs set up;Total assist (can help to hold cup) Patient Positioning: Upright in bed (needed full positioning support) Baseline Vocal Quality: Low vocal intensity (mumbled speech) Volitional Cough: Strong Volitional Swallow: Able to elicit (cues)    Oral/Motor/Sensory Function Overall Oral Motor/Sensory Function: Within functional limits   Ice Chips Ice chips: Impaired Presentation: Spoon (fed; 3 trials) Oral Phase Impairments:  (functional) Pharyngeal Phase Impairments: Throat Clearing - Immediate (x1/3)   Thin Liquid Thin Liquid: Not tested    Nectar Thick Nectar Thick Liquid: Within functional limits Presentation: Cup;Self Fed;Spoon (supported cup; ~3+ ozs)   Honey Thick Honey Thick Liquid: Not tested   Puree Puree: Within functional limits Presentation: Spoon (fed; ~4 ozs)   Solid     Solid: Not tested        Jerilynn Som, MS, CCC-SLP Speech Language Pathologist Rehab Services; [facility] [phone] (ascom) Beryl Balz 05/31/2023,4:16 PM

## 2023-05-31 NOTE — Progress Notes (Signed)
NAME:  Charles Sims, MRN:  [id number], DOB:  03-28-1930, LOS: 3 ADMISSION DATE:  05/28/2023, CONSULTATION DATE:  05/28/23 REFERRING MD:  Dr. Rosalia Hammers,   CHIEF COMPLAINT: Mental status changes  History of Present Illness:  89 yo M presenting to [facility] ED from [facility] via EMS for evaluation of AMS.  History provided per chart review and daughter, [name]'s, supplementation via telephone report as patient is intubated and unresponsive at this time.  Patient was recently discharged on 05/22/2023, at which point he was transferred to [facility] after PT/OT had recommended SNF placement.  This recent admission was after a fall and during admission he was noted to have a significant hemoglobin drop but family declined further GI workup due to overall decline.  At time of admission so reported baseline mentation to be ability to recognize family members, intermittent confusion- not oriented to place/time. Patient was also seen on 05/27/2023 in the ED due to concern of suspected hematemesis. On 7/22 Hgb was 7.1, no signs of bleeding noted, negative rectal exam.  At discharge, he was reportedly back to baseline then over the last several days (7/20-7/21), family reports mentation as becoming progressively worse. They deny any other complaints. No complaints of dyspnea, productive cough, urinary symptoms, falls, nausea/vomiting/diarrhea, chest pain per patient or SNF staff. Patient does have baseline tremors that have been worsening over the last few years. Per EMS the SNF staff were concerned feeling the patient's tremors had worsened as well as becoming progressively altered.  EMS reported difficulty obtaining in SpO2 reading but that he was 100% after being placed on a nonrebreather for transport.  ED course: Upon arrival patient GCS 3-4, febrile, hypotensive & tachypneic with what appeared to be rigors. Sepsis protocol initiated with empiric antibiotics & IVF resuscitation. Oxygenation stable but  mentation unimproved while in ED. Lengthy GOC discussion between EDP & daughter, [name]. Patient confirmed to be DNR but while still wanting all other needed interventions including mechanical intubation and ventilatory support. Patient urgently intubated due to inability to protect airway. Case discussed with Neurology on call, patient not a candidate for continuous EEG monitoring as tremors felt to be rigors due to temperature. Initial CTH negative, CXR clear. Labs significant for leukopenia, anemia, AKI, mild NAGMA, hypoalbuminemia with significant lactic acidosis and UTI. Medications given: acetaminophen, decadron, etomidate/ versed/ rocuronium, cefepime/ flagyl/ vancomycin, 2.25 L IVF bolus Initial Vitals: 102.8/ 33/ 120/ 76/56 & 97% on RA EKG Interpretation: Date: 05/28/23, EKG Time: 21:26, Rate: 85, Rhythm: NSR, QRS Axis: normal Intervals: prolonged QR, ST/T Wave abnormalities: none, Narrative Interpretation: NSR with prolonged QT Chemistry: Na+: 137, K+: 4.7, BUN/Cr.: 36/2.20, Serum CO2/ AG: 20/13, albumin: 2.8, glucose: 78 Hematology: WBC: 3.2, Hgb: 6.6,  Lactic/ PCT: 7.7 > 8.3/ 23.22, COVID-19 & Influenza A/B: negative UA: +mod Hgb, +large leuks, proteinuria, >50 WBC's  LP: pending ABG: 7.45/ 28/ 132/ 19.5 CXR 05/28/23: low lung volumes without acute or active cardiopulmonary disease. CT head wo contrast 05/28/23: no acute intracranial abnormality. Atrophy and advance chronic small vessel ischemic changes of the white matter. CT chest wo contrast 05/28/23: Trace bilateral pleural effusions. Patchy airspace opacities in the inferior right lower lobe worrisome for pneumonia. Acute/subacute right posterior sixth through tenth rib fractures. Mild cardiomegaly. CT angio GIB 05/28/23: Aortoiliac atherosclerosis.  No aneurysm or dissection. No contrast extravasation to localize GI bleed. Small amount of pericholecystic fluid. No visible stones or gallbladder wall thickening. Bladder wall thickening  likely related to bladder outlet obstruction. Large stool burden in  the rectosigmoid colon. Trace bilateral effusions, bibasilar atelectasis.  PCCM consulted for admission due to acute encephalopathy with inability to protect airway requiring urgent mechanical intubation and ventilatory support in the setting of suspected severe urosepsis.  Pertinent  Medical History  Bradycardia CAD Carotid artery stenosis HLD HTN Prostatitis HFpEF CKD Stage 3b BPH CVA Atrial Fibrillation not on anticoagulation Anemia Memory loss Significant Hospital Events: Including procedures, antibiotic start and stop dates in addition to other pertinent events   05/28/23: Admit to ICU with acute encephalopathy and inability to protect airway requiring urgent EGD mechanical intubation and ventilatory support in the setting of suspected severe urosepsis. 7/24 severe septic shock, multiorgan failure +E coli Bacteremia/Enterobactroles Bacteremia 7/26 weaned off pressors  Interim History / Subjective:  metabolic encephalopathy Severe resp failure -resolved Severe septic shock -resolved SD status and can transfer to [facility]   Objective   Blood pressure 112/73, pulse 83, temperature 98.4 F (36.9 C), resp. rate 13, height 5\' 10"  (1.778 m), weight 74 kg, SpO2 93%.    Vent Mode: PSV;CPAP FiO2 (%):  [30 %] 30 % Set Rate:  [12 bmp] 12 bmp Vt Set:  [450 mL] 450 mL PEEP:  [5 cmH20-8 cmH20] 5 cmH20 Pressure Support:  [5 cmH20-8 cmH20] 5 cmH20 Plateau Pressure:  [11 cmH20] 11 cmH20   Intake/Output Summary (Last 24 hours) at 05/31/2023 0705 Last data filed at 05/31/2023 0553 Gross per 24 hour  Intake 797.22 ml  Output 650 ml  Net 147.22 ml   Filed Weights   05/29/23 0258 05/30/23 0407 05/31/23 0500  Weight: 66.1 kg 73.6 kg 74 kg       REVIEW OF SYSTEMS LIMITED DUE TO DEMENTIA  PHYSICAL EXAMINATION:  GENERAL:critically ill appearing EYES: Pupils equal, round, reactive to light.  No scleral icterus.   MOUTH: Moist mucosal membrane NECK: Supple.  PULMONARY: Lungs clear to auscultation CARDIOVASCULAR: S1 and S2.  Regular rate and rhythm GASTROINTESTINAL: Soft, nontender, -distended. Positive bowel sounds.  MUSCULOSKELETAL: No swelling, clubbing, or edema.  NEUROLOGIC: arousal to voice, simple commands, no focal defecits SKIN:normal, warm to touch, Capillary refill delayed  Pulses present bilaterally    Assessment & Plan:  89 yo white male admitted to ICU for acute and severe Acute metabolic encephalopathy with severe resp failure due to inability to protect airway requiring mechanical ventilatory support due to severe acidosis and septic shock from GRAM NEGATIVE BACTEREMIA with progressive renal failure and liver dysfunction   Severe ACUTE Hypoxic and Hypercapnic Respiratory Failure-resolved Oxygen as needed  SEPTIC shock-resolving SOURCE-UTI & HCAP Lumbar puncture performed with initial findings not suggestive of meningitis - f/u cultures, trend lactic/ PCT - Daily CBC, monitor WBC/ fever curve - IV antibiotics:Rocephin - hold outpatient antihypertensive medications    NEUROLOGY ACUTE METABOLIC ENCEPHALOPATHY due to severe septic shock and acidosis Slowly resolving  ACUTE KIDNEY INJURY/Renal Failure -continue Foley Catheter-assess need -Avoid nephrotoxic agents -Follow urine output, BMP -Ensure adequate renal perfusion, optimize oxygenation -Renal dose medications   Intake/Output Summary (Last 24 hours) at 05/31/2023 0707 Last data filed at 05/31/2023 1914 Gross per 24 hour  Intake 797.22 ml  Output 650 ml  Net 147.22 ml         Acute on Chronic Anemia unknown etiology in the setting of chronic disease CT angio GIB negative, no signs of bleeding currently - continue with 1 unit of PRBC's > f/u H&H post - Monitor for s/s of bleeding - Daily CBC - Transfuse for Hgb <7 - PPI BID - hold Sq heparin  overnight, consider starting DVT prophylaxis in AM -  SCD's - consider Gastroenterology follow-up as indicated   ENDO - ICU hypoglycemic\Hyperglycemia protocol -check FSBS per protocol   GI GI PROPHYLAXIS as indicated  NUTRITIONAL STATUS DIET--> as tolerated Constipation protocol as indicated   ELECTROLYTES -follow labs as needed -replace as needed -pharmacy consultation and following   Best Practice (right click and "Reselect all SmartList Selections" daily)  Diet/type: NPO w/ meds via tube DVT prophylaxis: SCD GI prophylaxis: PPI Lines: N/A Foley:  Yes, and it is still needed Code Status:  DNR Last date of multidisciplinary goals of care discussion [05/28/23] Daughter [name] had extensive GOC discussion with EDP whoconfirmed. DNR with all other interventions at this time. Grave prognosis dicussed.  Labs   CBC: Recent Labs  Lab 05/28/23 1644 05/28/23 1711 05/29/23 0430 05/30/23 0438 05/31/23 0501  WBC 3.2* 3.5* 33.9* 39.6* 36.8*  NEUTROABS 2.2 2.3  --  30.0*  --   HGB 6.6* 6.4* 7.5* 7.3* 7.3*  HCT 20.7* 18.7* 22.8* 21.2* 21.2*  MCV 105.1* 97.9 98.3 94.6 94.6  PLT 335 321 291 237 218    Basic Metabolic Panel: Recent Labs  Lab 05/28/23 1644 05/29/23 0429 05/29/23 1114 05/30/23 0438 05/30/23 1645 05/31/23 0501  NA 137 132* 130* 131*  --  133*  K 4.7 4.2 3.8 3.9  --  3.7  CL 104 100 99 94*  --  96*  CO2 20* 21* 19* 26  --  27  GLUCOSE 78 171* 150* 148*  --  86  BUN 36* 40* 41* 44*  --  58*  CREATININE 2.20* 2.29* 2.17* 2.14*  --  2.23*  CALCIUM 7.9* 7.4* 7.3* 6.9*  --  7.1*  MG 1.5* 2.2  --  2.0 2.0  --   PHOS 3.0 3.3  --  3.6 2.9  --    GFR: Estimated Creatinine Clearance: 22.3 mL/min (A) (by C-G formula based on SCr of 2.23 mg/dL (H)). Recent Labs  Lab 05/28/23 1644 05/28/23 1711 05/28/23 1853 05/29/23 0429 05/29/23 0430 05/29/23 1114 05/29/23 1848 05/29/23 2202 05/30/23 0438 05/31/23 0501  PROCALCITON 23.22  --   --  >150.00  --   --   --   --  149.39  --   WBC 3.2* 3.5*  --   --  33.9*   --   --   --  39.6* 36.8*  LATICACIDVEN 7.7*  --    < > 5.8*  --  4.8* 3.3* 2.4*  --   --    < > = values in this interval not displayed.    Liver Function Tests: Recent Labs  Lab 05/27/23 0112 05/28/23 1644 05/29/23 0429  AST 18 31 42*  ALT 15 14 17   ALKPHOS 68 99 76  BILITOT 0.5 0.8 2.0*  PROT 5.7* 5.3* 4.5*  ALBUMIN 3.1* 2.8* 2.3*   No results for input(s): "LIPASE", "AMYLASE" in the last 168 hours. No results for input(s): "AMMONIA" in the last 168 hours.  ABG    Component Value Date/Time   PHART 7.45 05/28/2023 2055   PCO2ART 28 (L) 05/28/2023 2055   PO2ART 132 (H) 05/28/2023 2055   HCO3 22.6 05/29/2023 0105   TCO2 25 05/13/2019 1940   ACIDBASEDEF 2.7 (H) 05/29/2023 0105   O2SAT 16.9 05/29/2023 0105     Coagulation Profile: Recent Labs  Lab 05/28/23 1644  INR 1.3*    Cardiac Enzymes: No results for input(s): "CKTOTAL", "CKMB", "CKMBINDEX", "TROPONINI" in the last 168 hours.  HbA1C:  Hgb A1c MFr Bld  Date/Time Value Ref Range Status  05/17/2023 10:00 AM 5.2 4.8 - 5.6 % Final    Comment:    (NOTE)         Prediabetes: 5.7 - 6.4         Diabetes: >6.4         Glycemic control for adults with diabetes: <7.0   01/26/2023 04:41 PM 5.1 4.8 - 5.6 % Final    Comment:    (NOTE)         Prediabetes: 5.7 - 6.4         Diabetes: >6.4         Glycemic control for adults with diabetes: <7.0     SD Status Can transfer to [facility]  Lucie Leather, M.D.  [facility]  Medical Director [facility] Medical Director [facility] Cardio-Pulmonary Department

## 2023-05-31 NOTE — Progress Notes (Signed)
Upon removing foley catheter, a large red blood clot was noted at the tip of the catheter.  NP notified and assessed patient, will continue to monitor at this time.

## 2023-05-31 NOTE — Progress Notes (Signed)
Nutrition Follow Up Note   DOCUMENTATION CODES:   Severe malnutrition in context of chronic illness  INTERVENTION:   Nepro Shake po TID, each supplement provides 425 kcal and 19 grams protein.  Magic cup TID with meals, each supplement provides 290 kcal and 9 grams of protein  MVI po daily   Vitamin C 250mg  po BID  Pt remains at high refeed risk; recommend monitor potassium, magnesium and phosphorus labs daily until stable  NUTRITION DIAGNOSIS:   Severe Malnutrition related to chronic illness (CVA, dementia, CHF) as evidenced by severe muscle depletion, severe fat depletion, percent weight loss. -ongoing   GOAL:   Patient will meet greater than or equal to 90% of their needs -previously met with tube feeds   MONITOR:   PO intake, Supplement acceptance, Labs, Weight trends, Skin, I & O's  ASSESSMENT:   89 y/o male with h/o BPH, Afib, HTN, CHF, dementia, CAD, CVA, GERD, CKD III and depression who is admitted with UTI, HCAP, AKI, bacteremia and septic shock. Pt noted to have rib fractures.  Pt extubated this morning. Pt seen by SLP and initiated on a pureed/nectar thick diet. RD will add supplements and vitamins to help pt meet his estimated needs and to support wound healing. Per chart, pt is up ~18lbs since admission. Pt +6.1L on his I & Os.    Medications reviewed and include: solu-cortef, insulin, protonix, ceftriaxone  Labs reviewed: Na 133(L), K 3.7 wnl, BUN 58(H), creat 2.23(H) P 2.9 wnl, Mg 2.0 wnl- 7/25 Wbc- 36.8(H), Hgb 7.3(L), Hct 21.2(L) Cbgs- 89, 87, 91 x 24 hrs   Diet Order:   Diet Order             DIET - DYS 1 Room service appropriate? No; Fluid consistency: Nectar Thick  Diet effective now                  EDUCATION NEEDS:   No education needs have been identified at this time  Skin:  Skin Assessment: Reviewed RN Assessment (wounds on vertebral column and arm)  Last BM:  7/26- TYPE 6  Height:   Ht Readings from Last 1 Encounters:   05/29/23 5\' 10"  (1.778 m)    Weight:   Wt Readings from Last 1 Encounters:  05/31/23 74 kg    Ideal Body Weight:  75.45 kg  BMI:  Body mass index is 23.41 kg/m.  Estimated Nutritional Needs:   Kcal:  1800-2100kcal/day  Protein:  90-105g/day  Fluid:  1.7-2.0L/day  Betsey Holiday MS, RD, LDN Please refer to [facility] for RD and/or RD on-call/weekend/after hours pager

## 2023-05-31 NOTE — Progress Notes (Signed)
Daily Progress Note   Patient Name: Charles Sims       Date: 05/31/2023 DOB: 01/21/1930  Age: 89 y.o. MRN#: [id number] Attending Physician: Erin Fulling, MD Primary Care Physician: Joaquim Nam, MD Admit Date: 05/28/2023  Reason for Consultation/Follow-up: Establishing goals of care  Subjective: Slow to respond, minimally verbal - no complaints verbalized, he's unclear on events of past few days  Length of Stay: 3  Current Medications: Scheduled Meds:   atorvastatin  80 mg Per Tube Daily   Chlorhexidine Gluconate Cloth  6 each Topical Daily   FLUoxetine  20 mg Per Tube Daily   hydrocortisone sod succinate (SOLU-CORTEF) inj  50 mg Intravenous Q8H   insulin aspart  0-9 Units Subcutaneous Q4H   pantoprazole (PROTONIX) IV  40 mg Intravenous Q12H    Continuous Infusions:  sodium chloride Stopped (05/29/23 0255)   cefTRIAXone (ROCEPHIN)  IV Stopped (05/30/23 1619)   norepinephrine (LEVOPHED) Adult infusion Stopped (05/31/23 0015)   phenylephrine (NEO-SYNEPHRINE) Adult infusion Stopped (05/30/23 0123)    PRN Meds: acetaminophen **OR** acetaminophen, docusate, fentaNYL (SUBLIMAZE) injection, midazolam, mouth rinse, polyethylene glycol  Physical Exam Constitutional:      General: He is not in acute distress.    Appearance: He is ill-appearing.  Pulmonary:     Effort: Pulmonary effort is normal.     Comments: Room air Skin:    General: Skin is warm and dry.  Neurological:     Mental Status: He is disoriented.             Vital Signs: BP (!) 113/49   Pulse 72   Temp 98.6 F (37 C)   Resp 16   Ht 5\' 10"  (1.778 m)   Wt 74 kg   SpO2 95%   BMI 23.41 kg/m  SpO2: SpO2: 95 % O2 Device: O2 Device: Room Air O2 Flow Rate:    Intake/output summary:  Intake/Output Summary (Last 24  hours) at 05/31/2023 1114 Last data filed at 05/31/2023 0800 Gross per 24 hour  Intake 797.22 ml  Output 825 ml  Net -27.78 ml   LBM: Last BM Date : 05/31/23 Baseline Weight: Weight: 66.1 kg Most recent weight: Weight: 74 kg        Patient Active Problem List   Diagnosis Date Noted   Protein-calorie malnutrition, severe 05/29/2023   Generalized weakness 05/22/2023   AMS (altered mental status) 05/19/2023   Dementia without behavioral disturbance (HCC) 05/18/2023   Fall 05/17/2023   Atrial fibrillation, chronic (HCC) 05/17/2023   Hypertensive urgency 05/17/2023   HLD (hyperlipidemia) 05/17/2023   Chronic diastolic CHF (congestive heart failure) (HCC) 05/17/2023   Depression 05/17/2023   Chronic kidney disease, stage 3b (HCC) 05/17/2023   Myocardial injury 05/17/2023   Normocytic anemia 05/17/2023   Atrial fibrillation (HCC) 02/14/2023   Memory loss 02/13/2023   Malnutrition of moderate degree 01/28/2023   Stroke (cerebrum) (HCC) 01/25/2023   Weakness 01/24/2023   Hypokalemia 01/24/2023   Altered mental status 01/24/2023   Stroke (HCC) 01/24/2023   Iron deficiency anemia due to chronic blood loss 05/14/2022   Syncope 05/08/2022   Physical deconditioning 05/08/2022   Acute metabolic encephalopathy 05/14/2019   Community acquired pneumonia of left  lower lobe of lung 05/14/2019   Abdominal tenderness, left lower quadrant 05/14/2019   Bradycardia, sinus 02/13/2014   Chest pain 02/13/2014   Breathlessness on exertion 02/13/2014   Essential hypertension 02/13/2014   Carotid stenosis 02/13/2014   Benign prostatic hyperplasia with urinary obstruction 11/03/2012   Chronic prostatitis 11/03/2012   Abnormal prostate specific antigen 11/03/2012   Prostatitis 11/03/2012   Urgency of micturation 11/03/2012   CAD S/P percutaneous coronary angioplasty 06/04/2012    Palliative Care Assessment & Plan   HPI:  89 y.o. male  with past medical history of CAD, carotid artery stenosis,  HLD, HTN, HF, CKD, CVA, a fib, and memory loss admitted on 05/28/2023 with progressive AMS. Patient found to be hypotensive and tachypneic upon arrival to ED - d/t concerns about ability to protect airway patient was intubated.  CTH negative, CXR clear. Labs significant for leukopenia, anemia, AKI, mild NAGMA, hypoalbuminemia with significant lactic acidosis and UTI. Found positive for E coli Bacteremia/Enterobactroles Bacteremia. Patient requiring vasopressor support. PMT consulted to discuss GOC.   Assessment: Patient has been extubated and is off vasopressors. Significantly confused and weak. No family at bedside. Spoke to daughter [name] via phone - she shares her mother/patient's spouse has been placed back in nursing facility - the family is juggling care for her while also dealing with severe illness of Charles Sims. We review clinical situation of Charles Sims - [name] is glad he is doing well off the vent so far. To continue current care and allow time for outcomes - if patient were to require reintubation family would want this "one more time".   Recommendations/Plan: If fails extubation family would want another trial of ventilator support to allow time for outcomes PMT will shadow - please call/epic chat PMT with acute needs.  Code Status: DNR  Care plan was discussed with patient's daughter, RN  Thank you for allowing the Palliative Medicine Team to assist in the care of this patient.  *Please note that this is a verbal dictation therefore any spelling or grammatical errors are due to the "[employer]agon [employer] One" system interpretation.  Gerlean Ren, DNP, [facility] Palliative Medicine Team Team Phone # [phone]  Pager [phone]

## 2023-05-31 NOTE — TOC Progression Note (Signed)
Transition of Care [facility]) - Progression Note    Patient Details  Name: Charles Sims MRN: [id number] Date of Birth: 17-Jun-1930  Transition of [facility]) CM/SW Contact  Kreg Shropshire, RN Phone Number: 05/31/2023, 2:45 PM  Clinical Narrative:     TOC received consult for LTAC referral. Cm tried to call Daughter [name] [phone] regarding LTAC referral and offering choice. Unable to LVM at this time.   Expected Discharge Plan: Skilled Nursing Facility Barriers to Discharge: Continued Medical Work up  Expected Discharge Plan and Services       Living arrangements for the past 2 months: Skilled Nursing Facility                                       Social Determinants of Health (SDOH) Interventions SDOH Screenings   Food Insecurity: No Food Insecurity (05/29/2023)  Housing: Low Risk  (05/28/2023)  Transportation Needs: No Transportation Needs (05/28/2023)  Utilities: Not At Risk (05/28/2023)  Depression (PHQ2-9): Low Risk  (05/14/2022)  Tobacco Use: Medium Risk (05/28/2023)    Readmission Risk Interventions    05/30/2023    8:37 AM  Readmission Risk Prevention Plan  Transportation Screening Complete  Medication Review (RN Care Manager) Referral to Pharmacy  PCP or Specialist appointment within 3-5 days of discharge Complete  HRI or Home Care Consult Complete  SW Recovery Care/Counseling Consult Complete  Palliative Care Screening Complete  Skilled Nursing Facility Complete

## 2023-05-31 NOTE — Evaluation (Signed)
Occupational Therapy Evaluation Patient Details Name: Charles Sims MRN: [id number] DOB: August 13, 1930 Today's Date: 05/31/2023   History of Present Illness 89 y/o male presented to ED on 05/28/23 for AMS and hypoxia. Recently discharged to [facility] after fall and significant hemoglobin drop but family declined further GI workup. Intubated 7/23-7/26. Admitted for acute metabolic encephalopathy, severe respiratory failure, and suspected sepsis with shock due to suspected UTI and HCAP. PMH: CAD, carotid artery stenosis, HLD, HTN, HF, CKD, CVA, a fib, and memory loss   Clinical Impression   Charles Sims was seen for OT evaluation this date. Prior to hospital admission, pt was ambulatory per chart. Pt currently requires MAX A don B socks and gown seated EOB. Pt follows 1 step commands with mulitmodal cueing and redirection. Restless and confused t/o session. MAX A x2 + HHA sit<>stand x2.  Upon hospital discharge, recommend follow up services. Will trial OT for 3 sessions to determine if pt can participate given cognitive deficits.    Recommendations for follow up therapy are one component of a multi-disciplinary discharge planning process, led by the attending physician.  Recommendations may be updated based on patient status, additional functional criteria and insurance authorization.   Assistance Recommended at Discharge Frequent or constant Supervision/Assistance  Patient can return home with the following A lot of help with walking and/or transfers;A lot of help with bathing/dressing/bathroom;Direct supervision/assist for medications management;Direct supervision/assist for financial management;Assist for transportation;Help with stairs or ramp for entrance    Functional Status Assessment  Patient has had a recent decline in their functional status and demonstrates the ability to make significant improvements in function in a reasonable and predictable amount of time.  Equipment Recommendations   Wheelchair (measurements OT)    Recommendations for Other Services       Precautions / Restrictions Precautions Precautions: Fall Restrictions Weight Bearing Restrictions: No      Mobility Bed Mobility Overal bed mobility: Needs Assistance Bed Mobility: Supine to Sit, Sit to Supine     Supine to sit: Mod assist, +2 for physical assistance Sit to supine: Mod assist, +2 for physical assistance        Transfers Overall transfer level: Needs assistance Equipment used: 2 person hand held assist Transfers: Sit to/from Stand Sit to Stand: Max assist, +2 physical assistance                  Balance Overall balance assessment: Needs assistance Sitting-balance support: No upper extremity supported Sitting balance-Leahy Scale: Good     Standing balance support: Bilateral upper extremity supported Standing balance-Leahy Scale: Fair                             ADL either performed or assessed with clinical judgement   ADL Overall ADL's : Needs assistance/impaired                                       General ADL Comments: MAX A don B socks seated EOB. MAX A x2 + HHA simulated BSC t/f      Pertinent Vitals/Pain Pain Assessment Pain Assessment: PAINAD Breathing: normal Negative Vocalization: none Facial Expression: sad, frightened, frown Body Language: tense, distressed pacing, fidgeting Consolability: no need to console PAINAD Score: 2     Hand Dominance     Extremity/Trunk Assessment Upper Extremity Assessment Upper Extremity Assessment: Generalized weakness  Lower Extremity Assessment Lower Extremity Assessment: Generalized weakness       Communication Communication Communication: No difficulties   Cognition Arousal/Alertness: Awake/alert Behavior During Therapy: Impulsive, Restless Overall Cognitive Status: No family/caregiver present to determine baseline cognitive functioning                                         Home Living Family/patient expects to be discharged to:: Skilled nursing facility                                        Prior Functioning/Environment               Mobility Comments: per chart prior to last admission pt was ambluatory at home with assist          OT Problem List: Decreased strength;Impaired balance (sitting and/or standing);Decreased activity tolerance;Decreased safety awareness;Decreased knowledge of precautions;Decreased knowledge of use of DME or AE      OT Treatment/Interventions: Self-care/ADL training;Energy conservation;Cognitive remediation/compensation;Balance training;Therapeutic exercise;DME and/or AE instruction;Therapeutic activities    OT Goals(Current goals can be found in the care plan section) Acute Rehab OT Goals Patient Stated Goal: to take hit mitts off OT Goal Formulation: Patient unable to participate in goal setting Time For Goal Achievement: 06/14/23 Potential to Achieve Goals: Good ADL Goals Pt Will Perform Eating: with modified independence;bed level Pt Will Perform Grooming: sitting;with supervision Pt Will Transfer to Toilet: with min assist;stand pivot transfer;bedside commode  OT Frequency: Min 1X/week    Co-evaluation PT/OT/SLP Co-Evaluation/Treatment: Yes Reason for Co-Treatment: Complexity of the patient's impairments (multi-system involvement);Necessary to address cognition/behavior during functional activity;To address functional/ADL transfers PT goals addressed during session: Mobility/safety with mobility OT goals addressed during session: ADL's and self-care      AM-PAC OT "6 Clicks" Daily Activity     Outcome Measure Help from another person eating meals?: A Little Help from another person taking care of personal grooming?: A Lot Help from another person toileting, which includes using toliet, bedpan, or urinal?: A Lot Help from another person bathing (including washing,  rinsing, drying)?: A Lot Help from another person to put on and taking off regular upper body clothing?: A Lot Help from another person to put on and taking off regular lower body clothing?: A Lot 6 Click Score: 13   End of Session Equipment Utilized During Treatment: Rolling walker (2 wheels);Gait belt Nurse Communication: Mobility status  Activity Tolerance: Patient tolerated treatment well Patient left: in bed;with call bell/phone within reach;with bed alarm set  OT Visit Diagnosis: Unsteadiness on feet (R26.81);History of falling (Z91.81);Other abnormalities of gait and mobility (R26.89);Muscle weakness (generalized) (M62.81)                Time: 4098-1191 OT Time Calculation (min): 13 min Charges:  OT General Charges $OT Visit: 1 Visit OT Evaluation $OT Eval Low Complexity: 1 Low  Kathie Dike, M.S. OTR/L  05/31/23, 3:01 PM  ascom [phone]

## 2023-05-31 NOTE — Progress Notes (Signed)
PHARMACY CONSULT NOTE  Pharmacy Consult for Electrolyte Monitoring and Replacement   Recent Labs: Potassium (mmol/L)  Date Value  05/31/2023 3.7  10/15/2012 4.0   Magnesium (mg/dL)  Date Value  40/98/1191 2.0   Calcium (mg/dL)  Date Value  47/82/9562 7.1 (L)   Albumin (g/dL)  Date Value  13/06/6577 2.3 (L)   Phosphorus (mg/dL)  Date Value  46/96/2952 2.9   Sodium (mmol/L)  Date Value  05/31/2023 133 (L)   Assessment: 89 y/o male with h/o BPH, Afib, HTN, CHF, dementia, CAD, CVA, GERD, CKD III and depression who is admitted with UTI, HCAP, AKI, bacteremia and septic shock. Pharmacy is asked to follow and replace electrolytes while in CCU  Goal of Therapy:  Electrolytes WNL  Plan:  ---no electrolyte replacement warranted for today ---recheck electrolytes in am  Lowella Bandy ,PharmD Clinical Pharmacist 05/31/2023 7:10 AM

## 2023-05-31 NOTE — Plan of Care (Signed)
Continuing with plan of care. 

## 2023-05-31 NOTE — Evaluation (Signed)
Physical Therapy Evaluation Patient Details Name: Charles Sims MRN: [id number] DOB: 03/01/1930 Today's Date: 05/31/2023  History of Present Illness  89 y/o male presented to ED on 05/28/23 for AMS and hypoxia. Recently discharged to [facility] after fall and significant hemoglobin drop but family declined further GI workup. Intubated 7/23-7/26. Admitted for acute metabolic encephalopathy, severe respiratory failure, and suspected sepsis with shock due to suspected UTI and HCAP. PMH: CAD, carotid artery stenosis, HLD, HTN, HF, CKD, CVA, a fib, and memory loss  Clinical Impression  Patient admitted with the above. Per chart, patient was ambulatory prior to this and recent admission. Able to follow 1 step command with multimodal cueing. Patient requires maxA+2 to stand x 2 at bedside with HHAx2. Session limited by current cognition. Pulling at mittens at beginning and end of session. Patient will benefit from skilled PT services during acute stay to address listed deficits. Patient will benefit from ongoing therapy at discharge to maximize functional independence and safety. Will trial PT for 3 session to determine if patient can participate given cognitive deficits and potential for improvement.       Assistance Recommended at Discharge Frequent or constant Supervision/Assistance  If plan is discharge home, recommend the following:  Can travel by private vehicle  A lot of help with walking and/or transfers;A lot of help with bathing/dressing/bathroom;Assistance with cooking/housework;Direct supervision/assist for medications management;Assistance with feeding;Direct supervision/assist for financial management;Assist for transportation;Help with stairs or ramp for entrance   No    Equipment Recommendations Other (comment) (TBD)  Recommendations for Other Services       Functional Status Assessment Patient has had a recent decline in their functional status and/or demonstrates limited  ability to make significant improvements in function in a reasonable and predictable amount of time     Precautions / Restrictions Precautions Precautions: Fall Restrictions Weight Bearing Restrictions: No      Mobility  Bed Mobility Overal bed mobility: Needs Assistance Bed Mobility: Supine to Sit, Sit to Supine     Supine to sit: Mod assist, +2 for physical assistance Sit to supine: Mod assist, +2 for physical assistance        Transfers Overall transfer level: Needs assistance Equipment used: 2 person hand held assist Transfers: Sit to/from Stand Sit to Stand: Max assist, +2 physical assistance                Ambulation/Gait                  Stairs            Wheelchair Mobility     Tilt Bed    Modified Rankin (Stroke Patients Only)       Balance Overall balance assessment: Needs assistance Sitting-balance support: No upper extremity supported Sitting balance-Leahy Scale: Good     Standing balance support: Bilateral upper extremity supported Standing balance-Leahy Scale: Fair                               Pertinent Vitals/Pain Pain Assessment Pain Assessment: PAINAD Breathing: normal Negative Vocalization: none Facial Expression: sad, frightened, frown Body Language: tense, distressed pacing, fidgeting Consolability: no need to console PAINAD Score: 2 Pain Intervention(s): Monitored during session    Home Living Family/patient expects to be discharged to:: Skilled nursing facility                        Prior Function  Mobility Comments: per chart prior to last admission pt was ambluatory at home with assist       Hand Dominance        Extremity/Trunk Assessment   Upper Extremity Assessment Upper Extremity Assessment: Generalized weakness    Lower Extremity Assessment Lower Extremity Assessment: Generalized weakness       Communication   Communication: No  difficulties  Cognition Arousal/Alertness: Awake/alert Behavior During Therapy: Impulsive, Restless Overall Cognitive Status: No family/caregiver present to determine baseline cognitive functioning                                          General Comments      Exercises     Assessment/Plan    PT Assessment Patient needs continued PT services  PT Problem List Decreased cognition;Decreased knowledge of use of DME;Decreased safety awareness;Decreased mobility;Decreased strength;Decreased knowledge of precautions;Decreased balance;Pain;Decreased activity tolerance;Cardiopulmonary status limiting activity       PT Treatment Interventions DME instruction;Gait training;Functional mobility training;Therapeutic activities;Therapeutic exercise;Balance training;Patient/family education    PT Goals (Current goals can be found in the Care Plan section)  Acute Rehab PT Goals Patient Stated Goal: unable to state PT Goal Formulation: Patient unable to participate in goal setting Time For Goal Achievement: 06/14/23 Potential to Achieve Goals: Poor    Frequency Min 1X/week     Co-evaluation PT/OT/SLP Co-Evaluation/Treatment: Yes Reason for Co-Treatment: Complexity of the patient's impairments (multi-system involvement);Necessary to address cognition/behavior during functional activity;To address functional/ADL transfers PT goals addressed during session: Mobility/safety with mobility OT goals addressed during session: ADL's and self-care       AM-PAC PT "6 Clicks" Mobility  Outcome Measure Help needed turning from your back to your side while in a flat bed without using bedrails?: Total Help needed moving from lying on your back to sitting on the side of a flat bed without using bedrails?: Total Help needed moving to and from a bed to a chair (including a wheelchair)?: Total Help needed standing up from a chair using your arms (e.g., wheelchair or bedside chair)?:  Total Help needed to walk in hospital room?: Total Help needed climbing 3-5 steps with a railing? : Total 6 Click Score: 6    End of Session   Activity Tolerance: Other (comment) (Limited by cognition) Patient left: in bed;with call bell/phone within reach;with bed alarm set;with restraints reapplied Nurse Communication: Mobility status PT Visit Diagnosis: Unsteadiness on feet (R26.81);Muscle weakness (generalized) (M62.81);History of falling (Z91.81);Difficulty in walking, not elsewhere classified (R26.2)    Time: 4401-0272 PT Time Calculation (min) (ACUTE ONLY): 14 min   Charges:   PT Evaluation $PT Eval Moderate Complexity: 1 Mod   PT General Charges $$ ACUTE PT VISIT: 1 Visit         Maylon Peppers, PT, DPT Physical Therapist - [facility]  [facility]   Lachandra Dettmann A Jermeka Schlotterbeck 05/31/2023, 3:33 PM

## 2023-06-01 DIAGNOSIS — D500 Iron deficiency anemia secondary to blood loss (chronic): Secondary | ICD-10-CM

## 2023-06-01 DIAGNOSIS — A4151 Sepsis due to Escherichia coli [E. coli]: Principal | ICD-10-CM

## 2023-06-01 DIAGNOSIS — E43 Unspecified severe protein-calorie malnutrition: Secondary | ICD-10-CM

## 2023-06-01 DIAGNOSIS — I5032 Chronic diastolic (congestive) heart failure: Secondary | ICD-10-CM

## 2023-06-01 DIAGNOSIS — I10 Essential (primary) hypertension: Secondary | ICD-10-CM | POA: Diagnosis not present

## 2023-06-01 DIAGNOSIS — R6521 Severe sepsis with septic shock: Secondary | ICD-10-CM | POA: Diagnosis present

## 2023-06-01 DIAGNOSIS — G9341 Metabolic encephalopathy: Secondary | ICD-10-CM | POA: Diagnosis not present

## 2023-06-01 DIAGNOSIS — E785 Hyperlipidemia, unspecified: Secondary | ICD-10-CM

## 2023-06-01 LAB — GLUCOSE, CAPILLARY
Glucose-Capillary: 105 mg/dL — ABNORMAL HIGH (ref 70–99)
Glucose-Capillary: 115 mg/dL — ABNORMAL HIGH (ref 70–99)

## 2023-06-01 MED ORDER — HYDROCORTISONE SOD SUC (PF) 100 MG IJ SOLR: 50.0000 mg | Freq: Two times a day (BID) | INTRAMUSCULAR | Status: DC

## 2023-06-01 MED ORDER — ACETAMINOPHEN 325 MG PO TABS: 650.0000 mg | ORAL_TABLET | Freq: Four times a day (QID) | ORAL | Status: DC | PRN

## 2023-06-01 MED ORDER — POLYVINYL ALCOHOL 1.4 % OP SOLN: 1.0000 [drp] | Freq: Four times a day (QID) | OPHTHALMIC | Status: DC | PRN

## 2023-06-01 MED ORDER — MORPHINE SULFATE (PF) 2 MG/ML IV SOLN
1.0000 mg | INTRAVENOUS | Status: DC | PRN
  Administered 2023-06-01 – 2023-06-03 (×3): 1 mg via INTRAVENOUS
  Filled 2023-06-01 (×3): qty 1

## 2023-06-01 MED ORDER — ONDANSETRON HCL 4 MG/2ML IJ SOLN: 4.0000 mg | Freq: Four times a day (QID) | INTRAMUSCULAR | Status: DC | PRN

## 2023-06-01 MED ORDER — ONDANSETRON 4 MG PO TBDP: 4.0000 mg | ORAL_TABLET | Freq: Four times a day (QID) | ORAL | Status: DC | PRN

## 2023-06-01 MED ORDER — GLYCOPYRROLATE 1 MG PO TABS: 1.0000 mg | ORAL_TABLET | ORAL | Status: DC | PRN

## 2023-06-01 MED ORDER — ACETAMINOPHEN 650 MG RE SUPP: 650.0000 mg | Freq: Four times a day (QID) | RECTAL | Status: DC | PRN

## 2023-06-01 MED ORDER — HALOPERIDOL LACTATE 2 MG/ML PO CONC: 0.5000 mg | ORAL | Status: DC | PRN

## 2023-06-01 MED ORDER — HALOPERIDOL 0.5 MG PO TABS: 0.5000 mg | ORAL_TABLET | ORAL | Status: DC | PRN

## 2023-06-01 MED ORDER — LACTATED RINGERS IV SOLN: INTRAVENOUS | Status: DC

## 2023-06-01 MED ORDER — GLYCOPYRROLATE 0.2 MG/ML IJ SOLN: 0.2000 mg | INTRAMUSCULAR | Status: DC | PRN

## 2023-06-01 MED ORDER — BIOTENE DRY MOUTH MT LIQD: 15.0000 mL | OROMUCOSAL | Status: DC | PRN

## 2023-06-01 MED ORDER — HALOPERIDOL LACTATE 5 MG/ML IJ SOLN
0.5000 mg | INTRAMUSCULAR | Status: DC | PRN
  Administered 2023-06-01: 0.5 mg via INTRAVENOUS
  Filled 2023-06-01: qty 1

## 2023-06-01 NOTE — Assessment & Plan Note (Signed)
Concern of GI bleed but family does not want any further workup. CTA abdomen for GI bleed concern was negative during current hospitalization. Received 1 unit of PRBC -Hemoglobin currently stable at 8.2 -Monitor hemoglobin -Transfuse if below 7

## 2023-06-01 NOTE — Progress Notes (Signed)
Patient continues to be restless and remove cardiac monitoring leads and pulse ox. Will continue to re-apply.

## 2023-06-01 NOTE — Progress Notes (Signed)
Removed mittens they seemed to be aggravating him more than helping the situation.

## 2023-06-01 NOTE — Hospital Course (Addendum)
ICU transfer for 06/01/2023.  Taken from prior notes.  89 y.o. male with past medical history of CAD, carotid artery stenosis, HLD, HTN, HF, CKD, CVA, a fib, and memory loss admitted on 05/28/2023 with progressive AMS. Patient found to be hypotensive and tachypneic upon arrival to ED - d/t concerns about ability to protect airway patient was intubated and also required pressors. CTH negative, CXR clear. Labs significant for leukopenia, anemia, AKI, mild NAGMA, hypoalbuminemia with significant lactic acidosis and UTI.  CT angio GI bleed done on 7/23 with no contrast extravasation to localize GI bleed.  No other significant abnormality, bladder wall thickening and large stool burden.  Patient was recently discharged on 05/22/2023, at which point he was transferred to [facility] after PT/OT had recommended SNF placement. This recent admission was after a fall and during admission he was noted to have a significant hemoglobin drop but family declined further GI workup due to overall decline.   Patient was also seen on 05/27/2023 in the ED due to concern of suspected hematemesis. On 7/22 Hgb was 7.1, no signs of bleeding noted, negative rectal exam.   Patient was treated for septic shock, secondary to E. coli bacteremia, shows only resistant to ampicillin and Augmentin.  Urine culture was also positive for the same bacteria.  CSF cultures remain negative.  Patient was treated with ceftriaxone.  Also received 1 unit of PRBC due to acute on chronic anemia.  Patient was extubated and weaned off from pressors on 7/26 and care was transferred to [facility] from 06/01/2023.  Palliative care was also involved.  7/27: Vital stable, on room air.  Stable but persistent leukocytosis at 35.4, hemoglobin 8.2.  Worsening renal function with creatinine at 2.39(baseline appears to be around 1.4-1.7). Agitation and some fidgetiness reported requiring multiple doses of Haldol yesterday. Patient was on high doses of  Solu-Cortef-started tapering.  Refusing most of the p.o. intake, not following any commands.  Lengthy discussion with son who will discuss with family and if patient does not started showing any sign of recovery soon, likely transitioning to comfort care will be the best option.  I discourage attempting another intubation at this time as it will not be fruitful.  Patient is very high risk for deterioration and mortality based on advanced age, advanced dementia at baseline, underlying comorbidities and recent septic shock.  7/28: Patient was transitioned to full comfort care by family yesterday evening.  Family is requesting transferring to hospice facility.  Hospice facility was consulted-pending evaluation.  7/29: Patient remained pretty much unresponsive but appears comfortable.  Family now wants him to go to [facility] common with hospice as his wife is over there.  They will have a room for him tomorrow.  7/30: Patient currently stable and appears comfortable.  He is being discharged to [facility] common as requested by family with hospice help.  [facility] and will provide the necessary hospice medications for comfort.  Patient has a very grave prognosis.

## 2023-06-01 NOTE — Progress Notes (Signed)
PHARMACY CONSULT NOTE  Pharmacy Consult for Electrolyte Monitoring and Replacement   Recent Labs: Potassium (mmol/L)  Date Value  06/01/2023 4.0  10/15/2012 4.0   Magnesium (mg/dL)  Date Value  21/30/8657 2.2   Calcium (mg/dL)  Date Value  84/69/6295 7.7 (L)   Albumin (g/dL)  Date Value  28/41/3244 2.3 (L)   Phosphorus (mg/dL)  Date Value  11/07/7251 3.6   Sodium (mmol/L)  Date Value  06/01/2023 135   Assessment: 89 y/o male with h/o BPH, Afib, HTN, CHF, dementia, CAD, CVA, GERD, CKD III and depression who is admitted with UTI, HCAP, AKI, bacteremia and septic shock. Pharmacy is asked to follow and replace electrolytes while in CCU  Goal of Therapy:  Electrolytes within normal limits  Plan:  --No electrolyte replacement warranted for today --Patient care transferred from PCCM to [facility]. Will discontinue electrolyte consult at this time. Defer further ordering of labs and electrolyte replacement to primary team  Tressie Ellis 06/01/2023 10:07 AM

## 2023-06-01 NOTE — Plan of Care (Signed)
Continuing with plan of care. 

## 2023-06-01 NOTE — Assessment & Plan Note (Signed)
-  Continue Lipitor if he can take p.o.

## 2023-06-01 NOTE — Progress Notes (Signed)
LR at 54mL/hr ordered by Dr. Nelson Chimes.

## 2023-06-01 NOTE — Assessment & Plan Note (Signed)
Blood pressure currently on softer side. -Keep holding home antihypertensives

## 2023-06-01 NOTE — Progress Notes (Signed)
Patient continues to be restless despite prn medication and repositioning.  Patient refused breakfast this morning, Dr. Nelson Chimes updated and reaching out to family.

## 2023-06-01 NOTE — Progress Notes (Signed)
Progress Note   Patient: Charles Sims GNF:[id number] DOB: Feb 22, 1930 DOA: 05/28/2023     4 DOS: the patient was seen and examined on 06/01/2023   Brief hospital course: ICU transfer for 06/01/2023.  Taken from prior notes.  89 y.o. male with past medical history of CAD, carotid artery stenosis, HLD, HTN, HF, CKD, CVA, a fib, and memory loss admitted on 05/28/2023 with progressive AMS. Patient found to be hypotensive and tachypneic upon arrival to ED - d/t concerns about ability to protect airway patient was intubated and also required pressors. CTH negative, CXR clear. Labs significant for leukopenia, anemia, AKI, mild NAGMA, hypoalbuminemia with significant lactic acidosis and UTI.  CT angio GI bleed done on 7/23 with no contrast extravasation to localize GI bleed.  No other significant abnormality, bladder wall thickening and large stool burden.  Patient was recently discharged on 05/22/2023, at which point he was transferred to [facility] after PT/OT had recommended SNF placement. This recent admission was after a fall and during admission he was noted to have a significant hemoglobin drop but family declined further GI workup due to overall decline.   Patient was also seen on 05/27/2023 in the ED due to concern of suspected hematemesis. On 7/22 Hgb was 7.1, no signs of bleeding noted, negative rectal exam.   Patient was treated for septic shock, secondary to E. coli bacteremia, shows only resistant to ampicillin and Augmentin.  Urine culture was also positive for the same bacteria.  CSF cultures remain negative.  Patient was treated with ceftriaxone.  Also received 1 unit of PRBC due to acute on chronic anemia.  Patient was extubated and weaned off from pressors on 7/26 and care was transferred to [facility] from 06/01/2023.  Palliative care was also involved.  7/27: Vital stable, on room air.  Stable but persistent leukocytosis at 35.4, hemoglobin 8.2.  Worsening renal function with creatinine at  2.39(baseline appears to be around 1.4-1.7). Agitation and some fidgetiness reported requiring multiple doses of Haldol yesterday. Patient was on high doses of Solu-Cortef-started tapering.  Refusing most of the p.o. intake, not following any commands.  Lengthy discussion with son who will discuss with family and if patient does not started showing any sign of recovery soon, likely transitioning to comfort care will be the best option.  I discourage attempting another intubation at this time as it will not be fruitful.  Patient is very high risk for deterioration and mortality based on advanced age, advanced dementia at baseline, underlying comorbidities and recent septic shock.      Assessment and Plan: * Acute metabolic encephalopathy Patient remains significantly encephalopathic, recent diagnosis of severe sepsis with septic shock requiring pressors.  Currently off the pressors.  Not following any commands, intermittent agitation and pulling lines, requiring Haldol. History of advanced dementia. Palliative care is on board. -Continue to monitor and treat infection  Septic shock due to Escherichia coli [facility]) Patient recently admitted in ICU with severe sepsis and septic shock requiring intubation and pressors, secondary to E. coli UTI and bacteremia. Off the pressors now. -Start tapering stress doses of Solu-Cortef -Continue with Rocephin  Essential hypertension Blood pressure currently on softer side. -Keep holding home antihypertensives  Chronic diastolic CHF (congestive heart failure) (HCC) Echocardiogram done in March 2024 with low normal EF of 50 to 55% and grade 1 diastolic dysfunction.  Clinically appears euvolemic. -Monitor volume status  HLD (hyperlipidemia) -Continue Lipitor if he can take p.o.  Depression -Continue home Prozac  Protein-calorie malnutrition, severe  Dietitian consult  Iron deficiency anemia due to chronic blood loss Concern of GI bleed but family  does not want any further workup. CTA abdomen for GI bleed concern was negative during current hospitalization. Received 1 unit of PRBC -Hemoglobin currently stable at 8.2 -Monitor hemoglobin -Transfuse if below 7      Subjective: Patient seems very altered and unable to participate with any care.  Refusing all p.o. intake and not following any commands.  Spontaneous body movements.  Physical Exam: Vitals:   06/01/23 0701 06/01/23 0800 06/01/23 0900 06/01/23 1306  BP:    (!) 93/54  Pulse: 79 83 78   Resp: 18 15 15    Temp:    97.7 F (36.5 C)  TempSrc:    Axillary  SpO2: 97% 100% 95%   Weight:      Height:       General.  Chronically ill-appearing, frail elderly man, in no acute distress. Pulmonary.  Lungs clear bilaterally, normal respiratory effort. CV.  Regular rate and rhythm, no JVD, rub or murmur. Abdomen.  Soft, nontender, nondistended, BS positive. CNS.  Lethargic, not following any commands, spontaneously moving extremities. Extremities.  No edema, no cyanosis, pulses intact and symmetrical. Psychiatry.  Judgment and insight appears impaired  Data Reviewed: Prior data reviewed  Family Communication: Discussed with son on phone  Disposition: Status is: Inpatient Remains inpatient appropriate because: Severity of illness  Planned Discharge Destination: Skilled nursing facility  Time spent: 50 minutes  This record has been created using Conservation officer, historic buildings. Errors have been sought and corrected,but may not always be located. Such creation errors do not reflect on the standard of care.   Author: Arnetha Courser, MD 06/01/2023 2:05 PM  For on call review [email or web address].

## 2023-06-01 NOTE — Progress Notes (Signed)
Comfort care patient, did not disturb him with full assessment. He is warm, clean and dry. Resting quietly in bed with eyes closed respirations even and unlabored. Started to move more, administered ordered morphine and haldol and he appears to be resting quietly again with even respirations.

## 2023-06-01 NOTE — Assessment & Plan Note (Signed)
Echocardiogram done in March 2024 with low normal EF of 50 to 55% and grade 1 diastolic dysfunction.  Clinically appears euvolemic. -Monitor volume status

## 2023-06-01 NOTE — Progress Notes (Signed)
Received call from patient's sister [name]. She and her brother have spoken and decided they would like to shift patient to full comfort measures.   I confirmed that family would not like any future aggressive measures, medical interventions to sustain his life, no intubation, no IV fluids, and no other procedures/lab work. She would like for him to "not be hooked up to machines" into pass naturally.  She plans on visiting later this evening.  Above conversation conveyed to attending and dayshift RN Tresa Endo. Shift to comfort measures only.   PMT will continue to fall in support patient throughout his hospitalization. I am off service after today, but my colleagues will follow up with patient tomorrow.   Georgiann Cocker Palliative Medicine Team

## 2023-06-01 NOTE — Plan of Care (Signed)

## 2023-06-01 NOTE — TOC Progression Note (Signed)
Transition of Care [facility]) - Progression Note    Patient Details  Name: Charles Sims MRN: [id number] Date of Birth: Nov 20, 1929  Transition of Care [facility]) CM/SW Contact  Colette Ribas, [place] Phone Number: 06/01/2023, 11:15 AM  Clinical Narrative:    CSW reached out to the patients for LTAC options, Reviewed [facility] with her. I let her know both are located in [place]. She was unsure of which one, I advised her I will make the referral for both do LTAC representatives can reach out to her to talk. I called [facility] and left a message, because due to the weekend there intake/referral staff was limited. I left a message for DJ with [facility] they also also advised there staff is limited. I called sheri [phone] and updated her.    Expected Discharge Plan: Skilled Nursing Facility Barriers to Discharge: Continued Medical Work up  Expected Discharge Plan and Services       Living arrangements for the past 2 months: Skilled Nursing Facility                                       Social Determinants of Health (SDOH) Interventions SDOH Screenings   Food Insecurity: No Food Insecurity (05/29/2023)  Housing: Low Risk  (05/28/2023)  Transportation Needs: No Transportation Needs (05/28/2023)  Utilities: Not At Risk (05/28/2023)  Depression (PHQ2-9): Low Risk  (05/14/2022)  Tobacco Use: Medium Risk (05/28/2023)    Readmission Risk Interventions    05/30/2023    8:37 AM  Readmission Risk Prevention Plan  Transportation Screening Complete  Medication Review (RN Care Manager) Referral to Pharmacy  PCP or Specialist appointment within 3-5 days of discharge Complete  HRI or Home Care Consult Complete  SW Recovery Care/Counseling Consult Complete  Palliative Care Screening Complete  Skilled Nursing Facility Complete

## 2023-06-01 NOTE — Progress Notes (Signed)
Patient transitioning to comfort care at this time.

## 2023-06-01 NOTE — Assessment & Plan Note (Signed)
Continue home Prozac 

## 2023-06-01 NOTE — Progress Notes (Signed)
Speech Language Pathology Treatment: Dysphagia  Patient Details Name: Charles Sims MRN: [id number] DOB: 05/23/1930 Today's Date: 06/01/2023 Time: 1350-1405 SLP Time Calculation (min) (ACUTE ONLY): 15 min  Assessment / Plan / Recommendation Clinical Impression  Pt seen for ongoing dysphagia management. Pt was awake with eyes open when SLP entered room. SLP facilitated pt by repositioning him for PO intake. Pt appears restless and when presented with spoon, pt didn't open his mouth. Despite maximal cues, pt remained awake but didn't receive any PO trials during session. Pt's nurse also states that he refused all POs thus far today. Secure chat sent to pt's attending. ST will follow for appropriateness.     HPI HPI: Pt is a 89 yo M presenting to [facility] ED from [facility] via EMS for evaluation of AMS.     History provided per chart review and daughter, [name]'s, supplementation via telephone report as patient is intubated and unresponsive at admit.   Patient was recently discharged on 05/22/2023, at which point he was transferred to [facility] after PT/OT had recommended SNF placement.  This recent admission was after a fall and during admission he was noted to have a significant hemoglobin drop but family declined further GI workup due to overall decline.  At time of admission, Son reported baseline mentation to be ability to recognize family members, intermittent confusion- not oriented to place/time.  Patient was also seen on 05/27/2023 in the ED due to concern of suspected hematemesis. On 7/22 Hgb was 7.1, no signs of bleeding noted, negative rectal exam.  At discharge, he was reportedly back to baseline then over the last several days (7/20-7/21) at Facility, family reports mentation as becoming progressively worse.  No complaints of dyspnea, productive cough, urinary symptoms, falls, nausea/vomiting/diarrhea, chest pain SNF staff. Patient does have baseline tremors that have been worsening over  the last few years.  Per EMS the SNF staff were concerned feeling the patient's tremors had worsened as well as becoming progressively altered.  EMS reported difficulty obtaining in SpO2 reading but that he was 100% after being placed on a nonrebreather for transport.  Patient confirmed to be DNR but while still wanting all other needed interventions including mechanical intubation and ventilatory support, so patient urgently intubated due to inability to protect airway at admit -- extubated early AM on 7/26.  Case discussed with Neurology on call, patient not a candidate for continuous EEG monitoring as tremors felt to be rigors due to temperature. Initial CTH negative, CXR Clear.  Labs significant for leukopenia, anemia, AKI, mild NAGMA, hypoalbuminemia with significant lactic acidosis and UTI.   Chest CT at admit:  Trace bilateral pleural effusions.  2. Patchy airspace opacities in the inferior right lower lobe  worrisome for pneumonia.  3. Acute/subacute right posterior sixth through tenth rib fractures.  4. Mild cardiomegaly.      SLP Plan  Continue with current plan of care      Recommendations for follow up therapy are one component of a multi-disciplinary discharge planning process, led by the attending physician.  Recommendations may be updated based on patient status, additional functional criteria and insurance authorization.    Recommendations  Diet recommendations: Dysphagia 1 (puree);Nectar-thick liquid Medication Administration: Crushed with puree Supervision: Full supervision/cueing for compensatory strategies;Staff to assist with self feeding Compensations: Minimize environmental distractions;Slow rate;Small sips/bites Postural Changes and/or Swallow Maneuvers: Out of bed for meals                  Oral care  BID;Oral care before and after PO;Staff/trained caregiver to provide oral care     Dysphagia, pharyngeal phase (R13.13)     Continue with current plan of care      Cataldo Cosgriff  06/01/2023, 2:16 PM

## 2023-06-01 NOTE — Progress Notes (Signed)
Patient throughout this shift has gone from extreme restlessness to being mildly restless, patient is pale in color, has decreased verbalization of which words are unintelligible, and patient will not eat any of his meals.  Reached out to Dr. Nelson Chimes regarding patient's physical decline who is reaching out to the family to discuss goals of care.

## 2023-06-01 NOTE — Assessment & Plan Note (Signed)
Patient remains significantly encephalopathic, recent diagnosis of severe sepsis with septic shock requiring pressors.  Currently off the pressors.  Not following any commands, intermittent agitation and pulling lines, requiring Haldol. History of advanced dementia. Palliative care is on board. -Continue to monitor and treat infection

## 2023-06-01 NOTE — Assessment & Plan Note (Signed)
Patient recently admitted in ICU with severe sepsis and septic shock requiring intubation and pressors, secondary to E. coli UTI and bacteremia. Off the pressors now. -Start tapering stress doses of Solu-Cortef -Continue with Rocephin

## 2023-06-01 NOTE — Progress Notes (Signed)
Decent night overall, no agitation noted, just fidgety with everything he is not used to having on him.

## 2023-06-01 NOTE — Assessment & Plan Note (Signed)
-  Dietitian consult 

## 2023-06-02 DIAGNOSIS — I10 Essential (primary) hypertension: Secondary | ICD-10-CM | POA: Diagnosis not present

## 2023-06-02 DIAGNOSIS — G9341 Metabolic encephalopathy: Secondary | ICD-10-CM | POA: Diagnosis not present

## 2023-06-02 DIAGNOSIS — A4151 Sepsis due to Escherichia coli [E. coli]: Secondary | ICD-10-CM | POA: Diagnosis not present

## 2023-06-02 DIAGNOSIS — Z515 Encounter for palliative care: Secondary | ICD-10-CM

## 2023-06-02 NOTE — Progress Notes (Signed)
SLP Cancellation Note  Patient Details Name: Charles Sims MRN: [id number] DOB: 23-Oct-1930   Cancelled treatment:       Reason Eval/Treat Not Completed:  (chart reviewed)  Per chart notes, pt has transitioned to Comfort Care per Family wishes. ST order was discontinued by MD. MD to reconsult if any new needs arise.      Jerilynn Som, MS, CCC-SLP Speech Language Pathologist Rehab Services; [facility] [phone] (ascom) Krystalynn Ridgeway 06/02/2023, 8:16 AM

## 2023-06-02 NOTE — Assessment & Plan Note (Signed)
Blood pressure currently on softer side. -Keep holding home antihypertensives -Patient is now comfort care

## 2023-06-02 NOTE — Progress Notes (Signed)
Progress Note   Patient: Charles Sims EAV:[id number] DOB: 1930/02/13 DOA: 05/28/2023     5 DOS: the patient was seen and examined on 06/02/2023   Brief hospital course: ICU transfer for 06/01/2023.  Taken from prior notes.  89 y.o. male with past medical history of CAD, carotid artery stenosis, HLD, HTN, HF, CKD, CVA, a fib, and memory loss admitted on 05/28/2023 with progressive AMS. Patient found to be hypotensive and tachypneic upon arrival to ED - d/t concerns about ability to protect airway patient was intubated and also required pressors. CTH negative, CXR clear. Labs significant for leukopenia, anemia, AKI, mild NAGMA, hypoalbuminemia with significant lactic acidosis and UTI.  CT angio GI bleed done on 7/23 with no contrast extravasation to localize GI bleed.  No other significant abnormality, bladder wall thickening and large stool burden.  Patient was recently discharged on 05/22/2023, at which point he was transferred to [facility] after PT/OT had recommended SNF placement. This recent admission was after a fall and during admission he was noted to have a significant hemoglobin drop but family declined further GI workup due to overall decline.   Patient was also seen on 05/27/2023 in the ED due to concern of suspected hematemesis. On 7/22 Hgb was 7.1, no signs of bleeding noted, negative rectal exam.   Patient was treated for septic shock, secondary to E. coli bacteremia, shows only resistant to ampicillin and Augmentin.  Urine culture was also positive for the same bacteria.  CSF cultures remain negative.  Patient was treated with ceftriaxone.  Also received 1 unit of PRBC due to acute on chronic anemia.  Patient was extubated and weaned off from pressors on 7/26 and care was transferred to [facility] from 06/01/2023.  Palliative care was also involved.  7/27: Vital stable, on room air.  Stable but persistent leukocytosis at 35.4, hemoglobin 8.2.  Worsening renal function with creatinine at  2.39(baseline appears to be around 1.4-1.7). Agitation and some fidgetiness reported requiring multiple doses of Haldol yesterday. Patient was on high doses of Solu-Cortef-started tapering.  Refusing most of the p.o. intake, not following any commands.  Lengthy discussion with son who will discuss with family and if patient does not started showing any sign of recovery soon, likely transitioning to comfort care will be the best option.  I discourage attempting another intubation at this time as it will not be fruitful.  Patient is very high risk for deterioration and mortality based on advanced age, advanced dementia at baseline, underlying comorbidities and recent septic shock.  7/28: Patient was transitioned to full comfort care by family yesterday evening.  Family is requesting transferring to hospice facility.  Hospice facility was consulted-pending evaluation.    Assessment and Plan: * Acute metabolic encephalopathy Patient remains significantly encephalopathic, recent diagnosis of severe sepsis with septic shock requiring pressors.  Currently off the pressors.  Not following any commands, appears comfortable. Patient was transitioned to full comfort care yesterday evening. Family is not requesting transferring to hospice facility. Pending evaluation and approval for hospice home  Septic shock due to Escherichia coli [facility]) Patient recently admitted in ICU with severe sepsis and septic shock requiring intubation and pressors, secondary to E. coli UTI and bacteremia. Off the pressors now. -Patient is now comfort care  Essential hypertension Blood pressure currently on softer side. -Keep holding home antihypertensives -Patient is now comfort care  Chronic diastolic CHF (congestive heart failure) (HCC) Echocardiogram done in March 2024 with low normal EF of 50 to 55% and  grade 1 diastolic dysfunction.  Clinically appears euvolemic. -Monitor volume status  HLD  (hyperlipidemia) -Continue Lipitor if he can take p.o.  Depression -Continue home Prozac  Protein-calorie malnutrition, severe Dietitian consult  Iron deficiency anemia due to chronic blood loss Concern of GI bleed but family does not want any further workup. CTA abdomen for GI bleed concern was negative during current hospitalization. Received 1 unit of PRBC -Hemoglobin currently stable at 8.2 -Monitor hemoglobin -Transfuse if below 7      Subjective: Patient appears comfortable, pretty much unresponsive, withdrawing to painful stimuli.  Physical Exam: Vitals:   06/02/23 0000 06/02/23 0100 06/02/23 0200 06/02/23 0300  BP:      Pulse: 76 74 69 70  Resp: (!) 9 (!) 8 (!) 8 (!) 8  Temp:      TempSrc:      SpO2: 98% 96% 97% 97%  Weight:      Height:       General.  Unresponsive and frail elderly man, in no acute distress. Pulmonary.  Lungs clear bilaterally, normal respiratory effort. CV.  Regular rate and rhythm, no JVD, rub or murmur. Abdomen.  Soft, nontender, nondistended, BS positive. CNS.  Unresponsive, not following any commands, only some withdrawal to painful stimuli Extremities.  No edema, no cyanosis, pulses intact and symmetrical.  Data Reviewed: Prior data reviewed  Family Communication: Discussed with son on phone  Disposition: Status is: Inpatient Remains inpatient appropriate because: Severity of illness  Planned Discharge Destination: Hospice facility versus hospital death. Time spent: 45 minutes   This record has been created using Conservation officer, historic buildings. Errors have been sought and corrected,but may not always be located. Such creation errors do not reflect on the standard of care.   Author: Arnetha Courser, MD 06/02/2023 2:18 PM  For on call review [email or web address].

## 2023-06-02 NOTE — Assessment & Plan Note (Signed)
Patient recently admitted in ICU with severe sepsis and septic shock requiring intubation and pressors, secondary to E. coli UTI and bacteremia. Off the pressors now. -Patient is now comfort care

## 2023-06-02 NOTE — Plan of Care (Signed)
  Problem: Clinical Measurements: Goal: Ability to maintain clinical measurements within normal limits will improve Outcome: Progressing Goal: Respiratory complications will improve Outcome: Progressing Goal: Cardiovascular complication will be avoided Outcome: Progressing   Problem: Elimination: Goal: Will not experience complications related to bowel motility Outcome: Progressing   Problem: Pain Managment: Goal: General experience of comfort will improve Outcome: Progressing   Problem: Safety: Goal: Ability to remain free from injury will improve Outcome: Progressing

## 2023-06-02 NOTE — Assessment & Plan Note (Signed)
Patient remains significantly encephalopathic, recent diagnosis of severe sepsis with septic shock requiring pressors.  Currently off the pressors.  Not following any commands, appears comfortable. Patient was transitioned to full comfort care yesterday evening. Family is not requesting transferring to hospice facility. Pending evaluation and approval for hospice home

## 2023-06-02 NOTE — TOC Progression Note (Addendum)
Transition of Care [facility]) - Progression Note    Patient Details  Name: Charles Sims MRN: [id number] Date of Birth: 01/17/1930  Transition of Care [facility]) CM/SW Contact  Hetty Ely, RN Phone Number: 06/02/2023, 12:08 PM  Clinical Narrative: Per MD via secure chat, family is requesting Hospice Facility, Renea Ee informed. Diannia Ruder has discussed Hospice with daughter, Avon Gully who wants to discuss with Brother before making decision.     Expected Discharge Plan: Skilled Nursing Facility Barriers to Discharge: Continued Medical Work up  Expected Discharge Plan and Services       Living arrangements for the past 2 months: Skilled Nursing Facility                                       Social Determinants of Health (SDOH) Interventions SDOH Screenings   Food Insecurity: No Food Insecurity (05/29/2023)  Housing: Low Risk  (05/28/2023)  Transportation Needs: No Transportation Needs (05/28/2023)  Utilities: Not At Risk (05/28/2023)  Depression (PHQ2-9): Low Risk  (05/14/2022)  Tobacco Use: Medium Risk (05/28/2023)    Readmission Risk Interventions    05/30/2023    8:37 AM  Readmission Risk Prevention Plan  Transportation Screening Complete  Medication Review (RN Care Manager) Referral to Pharmacy  PCP or Specialist appointment within 3-5 days of discharge Complete  HRI or Home Care Consult Complete  SW Recovery Care/Counseling Consult Complete  Palliative Care Screening Complete  Skilled Nursing Facility Complete

## 2023-06-02 NOTE — Progress Notes (Signed)
[facility] [phone] [phone]   Name: Charles Sims Date: 06/02/2023 MRN: [id number]  DOB: 1929/12/31  Patient Care Team: Joaquim Nam, MD as PCP - General (Family Medicine)    REASON FOR CONSULTATION: Charles Sims is a 89 y.o. male with multiple medical problems including CAD, carotid artery stenosis, HLD, hypertension, history of CHF, CKD, CVA, A-fib, memory loss, who was admitted on 05/28/2023 with altered mental status.  Patient was ultimately intubated and required pressors for septic shock secondary to E. coli bacteremia and UTI.  Family opted to transition to comfort care.  CODE STATUS: DNR  PAST MEDICAL HISTORY: Past Medical History:  Diagnosis Date   Adenoma of right adrenal gland    Arthritis    Benign prostatic hypertrophy with lower urinary tract symptoms (LUTS)    Bladder calculi    Bradycardia    CAD (coronary artery disease)    Carotid stenosis    Coronary artery disease    Dysrhythmia    bradycardia   Elevated PSA    GERD (gastroesophageal reflux disease)    Gross hematuria    Hyperlipidemia    Hypertension    Memory loss    Prostatitis    Shortness of breath dyspnea    Urinary frequency     PAST SURGICAL HISTORY:  Past Surgical History:  Procedure Laterality Date   CAROTID ENDARTERECTOMY Left    CYSTOSCOPY WITH LITHOLAPAXY N/A 04/13/2015   Procedure: CYSTOSCOPY WITH LITHOLAPAXY;  Surgeon: Vanna Scotland, MD;  Location: [facility] ORS;  Service: Urology;  Laterality: N/A;    HEMATOLOGY/ONCOLOGY HISTORY:  Oncology History   No history exists.    ALLERGIES:  is allergic to lisinopril.  MEDICATIONS:  Current Facility-Administered Medications  Medication Dose Route Frequency Provider Last Rate Last Admin   0.9 %  sodium chloride infusion  250 mL Intravenous Continuous Rust-Chester, Cecelia Byars, NP   Stopped at 05/29/23 0255   acetaminophen (TYLENOL) tablet 650 mg   650 mg Oral Q6H PRN Arnetha Courser, MD       Or   acetaminophen (TYLENOL) suppository 650 mg  650 mg Rectal Q6H PRN Arnetha Courser, MD       antiseptic oral rinse (BIOTENE) solution 15 mL  15 mL Topical PRN Arnetha Courser, MD       diazepam (VALIUM) injection 2.5 mg  2.5 mg Intravenous Q4H PRN Erin Fulling, MD   2.5 mg at 06/01/23 1737   glycopyrrolate (ROBINUL) tablet 1 mg  1 mg Oral Q4H PRN Arnetha Courser, MD       Or   glycopyrrolate (ROBINUL) injection 0.2 mg  0.2 mg Subcutaneous Q4H PRN Arnetha Courser, MD       Or   glycopyrrolate (ROBINUL) injection 0.2 mg  0.2 mg Intravenous Q4H PRN Arnetha Courser, MD       haloperidol (HALDOL) tablet 0.5 mg  0.5 mg Oral Q4H PRN Arnetha Courser, MD       Or   haloperidol (HALDOL) 2 MG/ML solution 0.5 mg  0.5 mg Sublingual Q4H PRN Arnetha Courser, MD       Or   haloperidol lactate (HALDOL) injection 0.5 mg  0.5 mg Intravenous Q4H PRN Arnetha Courser, MD   0.5 mg at 06/01/23 2037   morphine (PF) 2 MG/ML injection 1 mg  1 mg Intravenous Q2H PRN Arnetha Courser, MD   1 mg at 06/01/23 2037   ondansetron (ZOFRAN-ODT) disintegrating tablet 4 mg  4 mg  Oral Q6H PRN Arnetha Courser, MD       Or   ondansetron (ZOFRAN) injection 4 mg  4 mg Intravenous Q6H PRN Arnetha Courser, MD       Oral care mouth rinse  15 mL Mouth Rinse PRN Rust-Chester, Cecelia Byars, NP       polyvinyl alcohol (LIQUIFILM TEARS) 1.4 % ophthalmic solution 1 drop  1 drop Both Eyes QID PRN Arnetha Courser, MD        VITAL SIGNS: BP (!) 131/105   Pulse 70   Temp 97.7 F (36.5 C) (Axillary)   Resp (!) 8   Ht 5\' 10"  (1.778 m)   Wt 158 lb 4.6 oz (71.8 kg)   SpO2 97%   BMI 22.71 kg/m  Filed Weights   05/30/23 0407 05/31/23 0500 06/01/23 0354  Weight: 162 lb 4.1 oz (73.6 kg) 163 lb 2.3 oz (74 kg) 158 lb 4.6 oz (71.8 kg)    Estimated body mass index is 22.71 kg/m as calculated from the following:   Height as of this encounter: 5\' 10"  (1.778 m).   Weight as of this encounter: 158 lb 4.6 oz (71.8  kg).  LABS: CBC:    Component Value Date/Time   WBC 35.4 (H) 06/01/2023 0621   HGB 8.2 (L) 06/01/2023 0621   HGB 11.2 (L) 05/24/2014 1530   HCT 23.0 (L) 06/01/2023 0621   HCT 34.4 (L) 05/24/2014 1530   PLT 209 06/01/2023 0621   PLT 233 05/24/2014 1530   MCV 92.7 06/01/2023 0621   MCV 90 05/24/2014 1530   NEUTROABS 30.0 (H) 05/30/2023 0438   LYMPHSABS 0.6 (L) 05/30/2023 0438   MONOABS 1.4 (H) 05/30/2023 0438   EOSABS 0.2 05/30/2023 0438   BASOSABS 0.2 (H) 05/30/2023 0438   Comprehensive Metabolic Panel:    Component Value Date/Time   NA 135 06/01/2023 0621   K 4.0 06/01/2023 0621   K 4.0 10/15/2012 1430   CL 97 (L) 06/01/2023 0621   CO2 26 06/01/2023 0621   BUN 68 (H) 06/01/2023 0621   CREATININE 2.39 (H) 06/01/2023 0621   GLUCOSE 115 (H) 06/01/2023 0621   CALCIUM 7.7 (L) 06/01/2023 0621   AST 42 (H) 05/29/2023 0429   ALT 17 05/29/2023 0429   ALKPHOS 76 05/29/2023 0429   BILITOT 2.0 (H) 05/29/2023 0429   PROT 4.5 (L) 05/29/2023 0429   ALBUMIN 2.3 (L) 05/29/2023 0429    RADIOGRAPHIC STUDIES: CT ANGIO GI BLEED  Result Date: 05/28/2023 CLINICAL DATA:  Sepsis, lower GI bleed EXAM: CTA ABDOMEN AND PELVIS WITHOUT AND WITH CONTRAST TECHNIQUE: Multidetector CT imaging of the abdomen and pelvis was performed using the standard protocol during bolus administration of intravenous contrast. Multiplanar reconstructed images and MIPs were obtained and reviewed to evaluate the vascular anatomy. RADIATION DOSE REDUCTION: This exam was performed according to the departmental dose-optimization program which includes automated exposure control, adjustment of the mA and/or kV according to patient size and/or use of iterative reconstruction technique. CONTRAST:  75mL OMNIPAQUE IOHEXOL 350 MG/ML SOLN COMPARISON:  None Available. FINDINGS: VASCULAR Aorta: Aortic atherosclerosis.  No aneurysm or dissection. Celiac: Widely patent SMA: Widely patent Renals: Single right renal artery and 2 left renal  arteries. No visible significant stenosis. IMA: Widely patent Inflow: Atherosclerotic calcifications throughout the iliac vessels. No aneurysm or dissection. Proximal Outflow: Atherosclerotic calcifications. No aneurysm or dissection. Veins: No obvious venous abnormality within the limitations of this arterial phase study. Review of the MIP images confirms the above findings. NON-VASCULAR Lower chest:  Trace bilateral pleural effusions. Dependent bibasilar atelectasis. Scattered coronary artery and aortic atherosclerosis. Hepatobiliary: Small amount of pericholecystic fluid. No visible stones or biliary ductal dilatation. No focal hepatic abnormality. Pancreas: No focal abnormality or ductal dilatation. Spleen: No focal abnormality.  Normal size. Adrenals/Urinary Tract: Urinary bladder is decompressed with Foley catheter in place. Bladder wall appears thickened, likely related to bladder outlet obstruction. No hydronephrosis. No suspicious renal or adrenal mass. Stomach/Bowel: Large stool burden in the rectosigmoid colon. Sigmoid diverticulosis. No active diverticulitis. No visible contrast extravasation to localize GI bleed. Stomach and small bowel decompressed, unremarkable. NG tube in the stomach. Lymphatic: No adenopathy Reproductive: Marked prostate enlargement. Other: No free fluid or free air. Musculoskeletal: No acute bony abnormality. IMPRESSION: VASCULAR Aortoiliac atherosclerosis.  No aneurysm or dissection. No contrast extravasation to localize GI bleed. NON-VASCULAR Small amount of pericholecystic fluid. No visible stones or gallbladder wall thickening. Bladder wall thickening likely related to bladder outlet obstruction. Large stool burden in the rectosigmoid colon. Trace bilateral effusions, bibasilar atelectasis. Electronically Signed   By: Charlett Nose M.D.   On: 05/28/2023 22:21   CT Chest Wo Contrast  Result Date: 05/28/2023 CLINICAL DATA:  Sepsis. EXAM: CT CHEST WITHOUT CONTRAST TECHNIQUE:  Multidetector CT imaging of the chest was performed following the standard protocol without IV contrast. RADIATION DOSE REDUCTION: This exam was performed according to the departmental dose-optimization program which includes automated exposure control, adjustment of the mA and/or kV according to patient size and/or use of iterative reconstruction technique. COMPARISON:  None Available. FINDINGS: Cardiovascular: The heart is mildly enlarged. There is no pericardial effusion. Aorta is normal in size. There are atherosclerotic calcifications of the aorta and coronary arteries. Mediastinum/Nodes: The tip of the endotracheal tube is 1.8 cm above the carina. Visualized thyroid gland is within normal limits. Enteric tube is seen throughout the esophagus. There are no enlarged mediastinal or hilar lymph nodes allowing for lack of intravenous contrast. Lungs/Pleura: There are trace bilateral pleural effusions. There some patchy airspace opacities in the inferior right lower lobe and minimal atelectatic changes in the left lung base. Additional atelectatic changes are seen in the inferior right upper lobe. Trachea and central airways are patent. Upper Abdomen: No acute abnormality. Musculoskeletal: There are acute/subacute nondisplaced posterior sixth through tenth rib fractures. There is DISH of the thoracic spine. IMPRESSION: 1. Trace bilateral pleural effusions. 2. Patchy airspace opacities in the inferior right lower lobe worrisome for pneumonia. 3. Acute/subacute right posterior sixth through tenth rib fractures. 4. Mild cardiomegaly. Aortic Atherosclerosis (ICD10-I70.0). Electronically Signed   By: Darliss Cheney M.D.   On: 05/28/2023 22:19   DG Chest Portable 1 View  Result Date: 05/28/2023 CLINICAL DATA:  Status post orogastric tube placement. EXAM: PORTABLE CHEST 1 VIEW COMPARISON:  May 28, 2023 (7:07 p.m.) FINDINGS: An endotracheal tube is seen with its distal tip approximately 2.8 cm from the carina. An  orogastric tube is noted with its distal end seen within the body of the stomach. The distal side hole sits approximately 4.7 cm distal to the expected region of the gastroesophageal junction. The heart size and mediastinal contours are within normal limits. Low lung volumes are noted, without evidence of an acute infiltrate, pleural effusion or pneumothorax. Multilevel degenerative changes are noted throughout the thoracic spine. IMPRESSION: 1. Endotracheal and orogastric tube positioning, as described above. 2. Low lung volumes without acute or active cardiopulmonary disease. Electronically Signed   By: Aram Candela M.D.   On: 05/28/2023 20:47   CT Head Wo  Contrast  Result Date: 05/28/2023 CLINICAL DATA:  Altered mental status EXAM: CT HEAD WITHOUT CONTRAST TECHNIQUE: Contiguous axial images were obtained from the base of the skull through the vertex without intravenous contrast. RADIATION DOSE REDUCTION: This exam was performed according to the departmental dose-optimization program which includes automated exposure control, adjustment of the mA and/or kV according to patient size and/or use of iterative reconstruction technique. COMPARISON:  MRI 05/17/2023, CT 05/17/2023 FINDINGS: Brain: No acute territorial infarction, hemorrhage or intracranial mass. Atrophy. Advanced chronic small vessel ischemic changes of the white matter. Small chronic left posterior frontal infarct. Stable ventricle size. Vascular: No hyperdense vessels. Vertebral and carotid vascular calcification Skull: Normal. Negative for fracture or focal lesion. Sinuses/Orbits: No acute finding. Other: None IMPRESSION: 1. No CT evidence for acute intracranial abnormality. 2. Atrophy and advanced chronic small vessel ischemic changes of the white matter. Electronically Signed   By: Jasmine Pang M.D.   On: 05/28/2023 19:55   DG Chest 1 View  Result Date: 05/28/2023 CLINICAL DATA:  Post intubation EXAM: CHEST  1 VIEW COMPARISON:   05/28/2023, 05/17/2023 FINDINGS: Interval intubation, tip of the endotracheal tube is 2.6 cm superior to carina. No acute airspace disease or pleural effusion. Stable cardiomediastinal silhouette with aortic atherosclerosis. No pneumothorax IMPRESSION: Endotracheal tube tip 2.6 cm superior to carina. Electronically Signed   By: Jasmine Pang M.D.   On: 05/28/2023 19:47   DG Chest Portable 1 View  Result Date: 05/28/2023 CLINICAL DATA:  Possible sepsis. EXAM: PORTABLE CHEST 1 VIEW COMPARISON:  05/17/2023 and prior radiographs FINDINGS: The cardiomediastinal silhouette is unchanged. Mild pulmonary vascular congestion is identified. There is no evidence of focal airspace disease, pulmonary edema, suspicious pulmonary nodule/mass, pleural effusion, or pneumothorax. No acute bony abnormalities are identified. IMPRESSION: Mild pulmonary vascular congestion. Electronically Signed   By: Harmon Pier M.D.   On: 05/28/2023 17:39   MR BRAIN WO CONTRAST  Result Date: 05/17/2023 CLINICAL DATA:  Mental status change, unknown cause EXAM: MRI HEAD WITHOUT CONTRAST TECHNIQUE: Multiplanar, multiecho pulse sequences of the brain and surrounding structures were obtained without intravenous contrast. COMPARISON:  MRI January 24, 2023. FINDINGS: Brain: No acute infarction, hemorrhage, hydrocephalus, extra-axial collection or mass lesion. Moderate T2/FLAIR hyperintense in the white matter, compatible with chronic microvascular disease. Cerebral atrophy. Vascular: Major arterial voids are maintained at the skull base. Skull and upper cervical spine: Normal marrow signal. Sinuses/Orbits: Mild paranasal sinus mucosal thickening. No acute orbital findings. Other: No mastoid effusions. IMPRESSION: 1. No evidence of acute intracranial. 2. Chronic microvascular ischemic disease and cerebral atrophy (ICD10-G31.9). Electronically Signed   By: Feliberto Harts M.D.   On: 05/17/2023 14:25   DG Chest 2 View  Result Date: 05/17/2023 CLINICAL  DATA:  89 year old male with history of weakness. EXAM: CHEST - 2 VIEW COMPARISON:  Chest x-ray 01/24/2023. FINDINGS: Skin fold artifact projecting over the lateral aspect of the right hemithorax. Lung volumes are low. No consolidative airspace disease. No pleural effusions. No pneumothorax. No pulmonary nodule or mass noted. Pulmonary vasculature and the cardiomediastinal silhouette are within normal limits. Atherosclerotic calcifications are noted in the thoracic aorta. IMPRESSION: 1. Low lung volumes without radiographic evidence of acute cardiopulmonary disease. 2. Aortic atherosclerosis. Electronically Signed   By: Trudie Reed M.D.   On: 05/17/2023 07:55   CT Cervical Spine Wo Contrast  Result Date: 05/17/2023 CLINICAL DATA:  89 year old male found down this morning. Weakness and strong urine odor. EXAM: CT CERVICAL SPINE WITHOUT CONTRAST TECHNIQUE: Multidetector CT imaging of the  cervical spine was performed without intravenous contrast. Multiplanar CT image reconstructions were also generated. RADIATION DOSE REDUCTION: This exam was performed according to the departmental dose-optimization program which includes automated exposure control, adjustment of the mA and/or kV according to patient size and/or use of iterative reconstruction technique. COMPARISON:  Cervical spine CT 05/08/2022. FINDINGS: Alignment: Stable since last year. Straightening of upper cervical lordosis. Bilateral posterior element alignment is within normal limits. Cervicothoracic junction alignment is within normal limits. Skull base and vertebrae: Bone mineralization is within normal limits for age. Visualized skull base is intact. No atlanto-occipital dissociation. C1 and C2 appear intact and aligned. No acute osseous abnormality identified. Soft tissues and spinal canal: No prevertebral fluid or swelling. No visible canal hematoma. Chronic left carotid space surgical clips. Chronic right suboccipital sebaceous cyst. Disc  levels: Chronic cervical spine degeneration, including facet arthropathy. Degenerative left C2-C3 facet ankylosis is newly developing since last year. Multifactorial spinal stenosis appears stable, probably maximal at C5-C6. Upper chest: Visible upper thoracic levels appears stable and intact. Negative lung apices. IMPRESSION: 1. No acute traumatic injury identified in the cervical spine. 2. Chronic cervical spine degeneration with chronic spinal stenosis and developing left C2-C3 facet ankylosis. Electronically Signed   By: Odessa Fleming M.D.   On: 05/17/2023 07:48   CT Head Wo Contrast  Result Date: 05/17/2023 CLINICAL DATA:  89 year old male found down this morning. Weakness and strong urine odor. EXAM: CT HEAD WITHOUT CONTRAST TECHNIQUE: Contiguous axial images were obtained from the base of the skull through the vertex without intravenous contrast. RADIATION DOSE REDUCTION: This exam was performed according to the departmental dose-optimization program which includes automated exposure control, adjustment of the mA and/or kV according to patient size and/or use of iterative reconstruction technique. COMPARISON:  Brain MRI 01/24/2023. Head CT 01/25/2023. FINDINGS: Brain: Stable cerebral volume. No midline shift, ventriculomegaly, mass effect, evidence of mass lesion, intracranial hemorrhage or evidence of cortically based acute infarction. Confluent bilateral cerebral white matter hypodensity with a small area of left superior perirolandic cortical encephalomalacia is stable. Vascular: Calcified atherosclerosis at the skull base. No suspicious intracranial vascular hyperdensity. Skull: No acute osseous abnormality identified.  TMJ degeneration. Sinuses/Orbits: Visualized paranasal sinuses and mastoids are stable and well aerated. Other: No acute orbit or scalp soft tissue injury is identified. Chronic right suboccipital sebaceous cyst in some scalp vessel calcified atherosclerosis. IMPRESSION: 1. No acute  intracranial abnormality or acute traumatic injury identified. 2.  Stable non contrast CT appearance of chronic ischemic disease. Electronically Signed   By: Odessa Fleming M.D.   On: 05/17/2023 07:45    PERFORMANCE STATUS (ECOG) : 4 - Bedbound  Review of Systems Unable to complete  Physical Exam General: Ill-appearing Pulmonary: Unlabored Extremities: no edema, no joint deformities Skin: no rashes Neurological: Unresponsive  IMPRESSION: Follow-up visit.  Covering for inpatient palliative care service.  Patient admitted with severe sepsis from E. coli bacteremia.  Initially required intubation and pressors.  Family opted to transition to comfort measures yesterday.  Patient currently comfortable appearing.  He is poorly responsive.  No family at bedside.  I called and spoke with patient's son by phone.  Son confirms decision to keep patient comfortable until end-of-life.  Son is interested in exploring options for hospice facility.  Will consult TOC to coordinate.  PLAN: -Comfort care -TOC to coordinate hospice facility  Case and plan discussed with Dr. Nelson Chimes  Time Total: 20 minutes  Visit consisted of counseling and education dealing with the complex and emotionally intense  issues of symptom management and palliative care in the setting of serious and potentially life-threatening illness.Greater than 50%  of this time was spent counseling and coordinating care related to the above assessment and plan.  Signed by: Laurette Schimke, PhD, NP-C

## 2023-06-02 NOTE — Plan of Care (Signed)
Patient resting in bed with eyes closed, alert to painful stimuli, VSS. Patient on comfort measures. Problem: Education: Goal: Knowledge of General Education information will improve Description: Including pain rating scale, medication(s)/side effects and non-pharmacologic comfort measures Outcome: Not Progressing   Problem: Health Behavior/Discharge Planning: Goal: Ability to manage health-related needs will improve Outcome: Not Progressing   Problem: Clinical Measurements: Goal: Ability to maintain clinical measurements within normal limits will improve Outcome: Not Progressing Goal: Will remain free from infection Outcome: Not Progressing Goal: Diagnostic test results will improve Outcome: Not Progressing Goal: Respiratory complications will improve Outcome: Not Progressing Goal: Cardiovascular complication will be avoided Outcome: Not Progressing   Problem: Activity: Goal: Risk for activity intolerance will decrease Outcome: Not Progressing   Problem: Nutrition: Goal: Adequate nutrition will be maintained Outcome: Not Progressing   Problem: Coping: Goal: Level of anxiety will decrease Outcome: Not Progressing   Problem: Elimination: Goal: Will not experience complications related to bowel motility Outcome: Not Progressing Goal: Will not experience complications related to urinary retention Outcome: Not Progressing   Problem: Pain Managment: Goal: General experience of comfort will improve Outcome: Not Progressing   Problem: Safety: Goal: Ability to remain free from injury will improve Outcome: Not Progressing   Problem: Skin Integrity: Goal: Risk for impaired skin integrity will decrease Outcome: Not Progressing   Problem: Education: Goal: Ability to describe self-care measures that may prevent or decrease complications (Diabetes Survival Skills Education) will improve Outcome: Not Progressing Goal: Individualized Educational Video(s) Outcome: Not  Progressing   Problem: Coping: Goal: Ability to adjust to condition or change in health will improve Outcome: Not Progressing   Problem: Fluid Volume: Goal: Ability to maintain a balanced intake and output will improve Outcome: Not Progressing   Problem: Health Behavior/Discharge Planning: Goal: Ability to identify and utilize available resources and services will improve Outcome: Not Progressing Goal: Ability to manage health-related needs will improve Outcome: Not Progressing   Problem: Metabolic: Goal: Ability to maintain appropriate glucose levels will improve Outcome: Not Progressing   Problem: Nutritional: Goal: Maintenance of adequate nutrition will improve Outcome: Not Progressing Goal: Progress toward achieving an optimal weight will improve Outcome: Not Progressing   Problem: Skin Integrity: Goal: Risk for impaired skin integrity will decrease Outcome: Not Progressing   Problem: Tissue Perfusion: Goal: Adequacy of tissue perfusion will improve Outcome: Not Progressing   Problem: Education: Goal: Knowledge of the prescribed therapeutic regimen will improve Outcome: Not Progressing   Problem: Coping: Goal: Ability to identify and develop effective coping behavior will improve Outcome: Not Progressing   Problem: Clinical Measurements: Goal: Quality of life will improve Outcome: Not Progressing   Problem: Respiratory: Goal: Verbalizations of increased ease of respirations will increase Outcome: Not Progressing   Problem: Role Relationship: Goal: Family's ability to cope with current situation will improve Outcome: Not Progressing Goal: Ability to verbalize concerns, feelings, and thoughts to partner or family member will improve Outcome: Not Progressing   Problem: Pain Management: Goal: Satisfaction with pain management regimen will improve Outcome: Not Progressing

## 2023-06-02 NOTE — Progress Notes (Signed)
Civil engineer, contracting Hospice InPatient Unit [facility]) Referral  Received request from Maryann Alar, SW,Transitions of Care Manager, for hospice services at [facility].  No family present at bedside.  I called and spoke with patient's daughter, Liliane Bade [facility]) regarding IPU referral.  Discussion had regarding hospice philosophy, hospice Medicare criteria and IPU [facility]).  Discussed hospice discharge options.  [name] verbalized understanding of information given.  Notified her patient had been approved for routine level of care at the [facility], which would encure a room and board fee.  She states she will need to discuss the options with her brother, Tammy Sours, who is out of town and not available to discuss until later this evening.  I notified Josh Borders, NP, Dr. Nelson Chimes and Levi Aland Caesar TOC.   Hospital liaison will follow up tomorrow.    The address has been verified and is correct in the chart.  [name] does state that she doesn't feel she will be able to care for her father at home.  She expresses her mother is currently at [facility] and she is very tired from caring for both her parents and back and forth between the nursing home and [facility].  Support given.  Please send signed and completed DNR home with patient/family if applicable. Please provide prescriptions at discharge as needed to ensure ongoing symptom management.  [facility] information and contact numbers given to [employer] Above information shared with Maryann Alar, SW, Transitions of Care.    Please call with any hospice related questions or concerns. Thank you for the opportunity to participate in this patient's care.  Norris Cross, RN Nurse Liaison [phone]

## 2023-06-03 DIAGNOSIS — Z515 Encounter for palliative care: Secondary | ICD-10-CM | POA: Diagnosis not present

## 2023-06-03 DIAGNOSIS — A4151 Sepsis due to Escherichia coli [E. coli]: Secondary | ICD-10-CM | POA: Diagnosis not present

## 2023-06-03 DIAGNOSIS — G9341 Metabolic encephalopathy: Secondary | ICD-10-CM | POA: Diagnosis not present

## 2023-06-03 DIAGNOSIS — I10 Essential (primary) hypertension: Secondary | ICD-10-CM | POA: Diagnosis not present

## 2023-06-03 DIAGNOSIS — R6521 Severe sepsis with septic shock: Secondary | ICD-10-CM | POA: Diagnosis not present

## 2023-06-03 MED ORDER — MORPHINE SULFATE (PF) 2 MG/ML IV SOLN
2.0000 mg | Freq: Four times a day (QID) | INTRAVENOUS | Status: DC
  Administered 2023-06-03 – 2023-06-04 (×3): 2 mg via INTRAVENOUS
  Filled 2023-06-03 (×3): qty 1

## 2023-06-03 MED ORDER — MORPHINE SULFATE (PF) 2 MG/ML IV SOLN: 2.0000 mg | INTRAVENOUS | Status: DC | PRN

## 2023-06-03 NOTE — Plan of Care (Signed)
Pt is alert to self, VSS, bed is low, locked for safety. Pt is Comfort Care, being repositioned Q2 hours. Edema noted throughout body, arms, legs, feet, and scrotum. Pt has male Purewick with dark colored urine noted in canister. pillow used for comfort placed between legs and on the left side. Pt has new order for IV morphine for comfort care.   Problem: Education: Goal: Knowledge of General Education information will improve Description: Including pain rating scale, medication(s)/side effects and non-pharmacologic comfort measures Outcome: Not Progressing   Problem: Health Behavior/Discharge Planning: Goal: Ability to manage health-related needs will improve Outcome: Not Progressing   Problem: Clinical Measurements: Goal: Ability to maintain clinical measurements within normal limits will improve Outcome: Not Progressing Goal: Will remain free from infection Outcome: Not Progressing Goal: Diagnostic test results will improve Outcome: Not Progressing Goal: Respiratory complications will improve Outcome: Not Progressing Goal: Cardiovascular complication will be avoided Outcome: Not Progressing   Problem: Activity: Goal: Risk for activity intolerance will decrease Outcome: Not Progressing   Problem: Nutrition: Goal: Adequate nutrition will be maintained Outcome: Not Progressing   Problem: Coping: Goal: Level of anxiety will decrease Outcome: Not Progressing   Problem: Elimination: Goal: Will not experience complications related to bowel motility Outcome: Not Progressing Goal: Will not experience complications related to urinary retention Outcome: Not Progressing   Problem: Pain Managment: Goal: General experience of comfort will improve Outcome: Not Progressing   Problem: Safety: Goal: Ability to remain free from injury will improve Outcome: Not Progressing   Problem: Skin Integrity: Goal: Risk for impaired skin integrity will decrease Outcome: Not Progressing    Problem: Education: Goal: Ability to describe self-care measures that may prevent or decrease complications (Diabetes Survival Skills Education) will improve Outcome: Not Progressing Goal: Individualized Educational Video(s) Outcome: Not Progressing   Problem: Coping: Goal: Ability to adjust to condition or change in health will improve Outcome: Not Progressing   Problem: Fluid Volume: Goal: Ability to maintain a balanced intake and output will improve Outcome: Not Progressing   Problem: Health Behavior/Discharge Planning: Goal: Ability to identify and utilize available resources and services will improve Outcome: Not Progressing Goal: Ability to manage health-related needs will improve Outcome: Not Progressing   Problem: Metabolic: Goal: Ability to maintain appropriate glucose levels will improve Outcome: Not Progressing   Problem: Nutritional: Goal: Maintenance of adequate nutrition will improve Outcome: Not Progressing Goal: Progress toward achieving an optimal weight will improve Outcome: Not Progressing   Problem: Skin Integrity: Goal: Risk for impaired skin integrity will decrease Outcome: Not Progressing   Problem: Tissue Perfusion: Goal: Adequacy of tissue perfusion will improve Outcome: Not Progressing   Problem: Education: Goal: Knowledge of the prescribed therapeutic regimen will improve Outcome: Not Progressing   Problem: Coping: Goal: Ability to identify and develop effective coping behavior will improve Outcome: Not Progressing   Problem: Clinical Measurements: Goal: Quality of life will improve Outcome: Not Progressing   Problem: Respiratory: Goal: Verbalizations of increased ease of respirations will increase Outcome: Not Progressing   Problem: Role Relationship: Goal: Family's ability to cope with current situation will improve Outcome: Not Progressing Goal: Ability to verbalize concerns, feelings, and thoughts to partner or family member  will improve Outcome: Not Progressing   Problem: Pain Management: Goal: Satisfaction with pain management regimen will improve Outcome: Not Progressing

## 2023-06-03 NOTE — Care Management Important Message (Signed)
Important Message  Patient Details  Name: Charles Sims MRN: [id number] Date of Birth: April 07, 1930   Medicare Important Message Given:  Yes     Monette Omara, Stephan Minister 06/03/2023, 3:15 PM

## 2023-06-03 NOTE — Progress Notes (Signed)
NP in to see pt, pt c/o pain showing restlessness, morphine to be administered for comfort.

## 2023-06-03 NOTE — Progress Notes (Signed)
Rounded on pt, daughter and son-in-law at bedside visiting with patient. Daughter informed me that POC is to get pt transferred to [facility] where her mother is at.  Pt resting in bed, no s/s of any pain noted, will continue to monitor pt.

## 2023-06-03 NOTE — Progress Notes (Signed)
Daily Progress Note   Patient Name: Charles Sims       Date: 06/03/2023 DOB: 1930-08-02  Age: 89 y.o. MRN#: [id number] Attending Physician: Arnetha Courser, MD Primary Care Physician: Joaquim Nam, MD Admit Date: 05/28/2023  Reason for Consultation/Follow-up: Establishing goals of care  Patient Profile/HPI: Charles Sims is a 89 y.o. male with multiple medical problems including CAD, carotid artery stenosis, HLD, hypertension, history of CHF, CKD, CVA, A-fib, memory loss, who was admitted on 05/28/2023 with altered mental status.  Patient was ultimately intubated and required pressors for septic shock secondary to E. coli bacteremia and UTI.  Family opted to transition to comfort care.   Subjective: Chart reviewed including labs, progress notes, imaging from this and previous encounters.  Patient awake, agitated, grabbing his arm. Nodded his head "yes" when asked if in pain. Attempted to verbalize but no intelligible speech.  Discussed with RN.  Review of Systems  Unable to perform ROS: Mental status change     Physical Exam Vitals and nursing note reviewed.  Constitutional:      Appearance: He is ill-appearing.  Cardiovascular:     Rate and Rhythm: Normal rate.  Pulmonary:     Effort: Pulmonary effort is normal.  Neurological:     Comments: nonverbal             Vital Signs: BP (!) 149/94 (BP Location: Left Arm)   Pulse 82   Temp 97.8 F (36.6 C) (Oral)   Resp 14   Ht 5\' 10"  (1.778 m)   Wt 67.2 kg   SpO2 97%   BMI 21.26 kg/m  SpO2: SpO2: 97 % O2 Device: O2 Device: Nasal Cannula O2 Flow Rate: O2 Flow Rate (L/min): 2 L/min  Intake/output summary:  Intake/Output Summary (Last 24 hours) at 06/03/2023 1140 Last data filed at 06/03/2023 0740 Gross per 24 hour  Intake --   Output 700 ml  Net -700 ml   LBM: Last BM Date : 06/01/23 Baseline Weight: Weight: 66.1 kg Most recent weight: Weight: 67.2 kg       Palliative Assessment/Data: PPS: 10%      Patient Active Problem List   Diagnosis Date Noted   Palliative care encounter 06/02/2023   Septic shock due to Escherichia coli (HCC) 06/01/2023   Protein-calorie malnutrition, severe 05/29/2023  Generalized weakness 05/22/2023   AMS (altered mental status) 05/19/2023   Dementia without behavioral disturbance (HCC) 05/18/2023   Fall 05/17/2023   Atrial fibrillation, chronic (HCC) 05/17/2023   Hypertensive urgency 05/17/2023   HLD (hyperlipidemia) 05/17/2023   Chronic diastolic CHF (congestive heart failure) (HCC) 05/17/2023   Depression 05/17/2023   Chronic kidney disease, stage 3b (HCC) 05/17/2023   Myocardial injury 05/17/2023   Normocytic anemia 05/17/2023   Atrial fibrillation (HCC) 02/14/2023   Memory loss 02/13/2023   Malnutrition of moderate degree 01/28/2023   Stroke (cerebrum) (HCC) 01/25/2023   Weakness 01/24/2023   Hypokalemia 01/24/2023   Altered mental status 01/24/2023   Stroke (HCC) 01/24/2023   Iron deficiency anemia due to chronic blood loss 05/14/2022   Syncope 05/08/2022   Physical deconditioning 05/08/2022   Acute metabolic encephalopathy 05/14/2019   Community acquired pneumonia of left lower lobe of lung 05/14/2019   Abdominal tenderness, left lower quadrant 05/14/2019   Bradycardia, sinus 02/13/2014   Chest pain 02/13/2014   Breathlessness on exertion 02/13/2014   Essential hypertension 02/13/2014   Carotid stenosis 02/13/2014   Benign prostatic hyperplasia with urinary obstruction 11/03/2012   Chronic prostatitis 11/03/2012   Abnormal prostate specific antigen 11/03/2012   Prostatitis 11/03/2012   Urgency of micturation 11/03/2012   CAD S/P percutaneous coronary angioplasty 06/04/2012    Palliative Care Assessment & Plan     Assessment/Recommendations/Plan  Comfort measures only- requested prn medication from RN Start morphine 2mg  IV q6hr in addition to prn dosing   Code Status: DNR  Prognosis:  < 2 weeks  Discharge Planning: To Be Determined  Care plan was discussed with patient nurse.   Thank you for allowing the Palliative Medicine Team to assist in the care of this patient.  Greater than 50%  of this time was spent counseling and coordinating care related to the above assessment and plan.  Ocie Bob, AGNP-C Palliative Medicine   Please contact Palliative Medicine Team phone at [phone] for questions and concerns.

## 2023-06-03 NOTE — [place] FL2 (Signed)
[place] MEDICAID FL2 LEVEL OF CARE FORM     IDENTIFICATION  Patient Name: Charles Sims Birthdate: 05/27/1930 Sex: male Admission Date (Current Location): 05/28/2023  [place] and [place] Number:  Chiropodist and Address:  [facility], [address], [place], [place] [address]      Provider Number: [id number]  Attending Physician Name and Address:  Arnetha Courser, MD  Relative Name and Phone Number:  Mickeal Skinner Daughter [phone]    Current Level of Care: Hospital Recommended Level of Care: Skilled Nursing Facility (LTC with hospice) Prior Approval Number:    Date Approved/Denied:   PASRR Number: [id number]  Discharge Plan: SNF    Current Diagnoses: Patient Active Problem List   Diagnosis Date Noted   Palliative care encounter 06/02/2023   Septic shock due to Escherichia coli (HCC) 06/01/2023   Protein-calorie malnutrition, severe 05/29/2023   Generalized weakness 05/22/2023   AMS (altered mental status) 05/19/2023   Dementia without behavioral disturbance (HCC) 05/18/2023   Fall 05/17/2023   Atrial fibrillation, chronic (HCC) 05/17/2023   Hypertensive urgency 05/17/2023   HLD (hyperlipidemia) 05/17/2023   Chronic diastolic CHF (congestive heart failure) (HCC) 05/17/2023   Depression 05/17/2023   Chronic kidney disease, stage 3b (HCC) 05/17/2023   Myocardial injury 05/17/2023   Normocytic anemia 05/17/2023   Atrial fibrillation (HCC) 02/14/2023   Memory loss 02/13/2023   Malnutrition of moderate degree 01/28/2023   Stroke (cerebrum) (HCC) 01/25/2023   Weakness 01/24/2023   Hypokalemia 01/24/2023   Altered mental status 01/24/2023   Stroke (HCC) 01/24/2023   Iron deficiency anemia due to chronic blood loss 05/14/2022   Syncope 05/08/2022   Physical deconditioning 05/08/2022   Acute metabolic encephalopathy 05/14/2019   Community acquired pneumonia of left lower lobe of lung 05/14/2019   Abdominal tenderness, left  lower quadrant 05/14/2019   Bradycardia, sinus 02/13/2014   Chest pain 02/13/2014   Breathlessness on exertion 02/13/2014   Essential hypertension 02/13/2014   Carotid stenosis 02/13/2014   Benign prostatic hyperplasia with urinary obstruction 11/03/2012   Chronic prostatitis 11/03/2012   Abnormal prostate specific antigen 11/03/2012   Prostatitis 11/03/2012   Urgency of micturation 11/03/2012   CAD S/P percutaneous coronary angioplasty 06/04/2012    Orientation RESPIRATION BLADDER Height & Weight     Self  Normal External catheter Weight: 148 lb 2.4 oz (67.2 kg) Height:  5\' 10"  (177.8 cm)  BEHAVIORAL SYMPTOMS/MOOD NEUROLOGICAL BOWEL NUTRITION STATUS      Incontinent Diet (see d/c summary)  AMBULATORY STATUS COMMUNICATION OF NEEDS Skin   Limited Assist Verbally Normal                       Personal Care Assistance Level of Assistance  Bathing, Feeding, Dressing Bathing Assistance: Maximum assistance Feeding assistance: Maximum assistance Dressing Assistance: Maximum assistance     Functional Limitations Info             SPECIAL CARE FACTORS FREQUENCY                       Contractures Contractures Info: Not present    Additional Factors Info  Code Status Code Status Info: DNR Allergies Info: Lisinopril           Current Medications (06/03/2023):  This is the current hospital active medication list Current Facility-Administered Medications  Medication Dose Route Frequency Provider Last Rate Last Admin   0.9 %  sodium chloride infusion  250 mL Intravenous Continuous  Rust-Chester, Cecelia Byars, NP   Stopped at 05/29/23 0255   acetaminophen (TYLENOL) tablet 650 mg  650 mg Oral Q6H PRN Arnetha Courser, MD       Or   acetaminophen (TYLENOL) suppository 650 mg  650 mg Rectal Q6H PRN Arnetha Courser, MD       antiseptic oral rinse (BIOTENE) solution 15 mL  15 mL Topical PRN Arnetha Courser, MD       diazepam (VALIUM) injection 2.5 mg  2.5 mg Intravenous Q4H PRN  Erin Fulling, MD   2.5 mg at 06/01/23 1737   glycopyrrolate (ROBINUL) tablet 1 mg  1 mg Oral Q4H PRN Arnetha Courser, MD       Or   glycopyrrolate (ROBINUL) injection 0.2 mg  0.2 mg Subcutaneous Q4H PRN Arnetha Courser, MD       Or   glycopyrrolate (ROBINUL) injection 0.2 mg  0.2 mg Intravenous Q4H PRN Arnetha Courser, MD       haloperidol (HALDOL) tablet 0.5 mg  0.5 mg Oral Q4H PRN Arnetha Courser, MD       Or   haloperidol (HALDOL) 2 MG/ML solution 0.5 mg  0.5 mg Sublingual Q4H PRN Arnetha Courser, MD       Or   haloperidol lactate (HALDOL) injection 0.5 mg  0.5 mg Intravenous Q4H PRN Arnetha Courser, MD   0.5 mg at 06/01/23 2037   morphine (PF) 2 MG/ML injection 2 mg  2 mg Intravenous Q2H PRN Barbara Cower, NP       morphine (PF) 2 MG/ML injection 2 mg  2 mg Intravenous Q6H Mahan, Kasie J, NP       ondansetron (ZOFRAN-ODT) disintegrating tablet 4 mg  4 mg Oral Q6H PRN Arnetha Courser, MD       Or   ondansetron (ZOFRAN) injection 4 mg  4 mg Intravenous Q6H PRN Arnetha Courser, MD       Oral care mouth rinse  15 mL Mouth Rinse PRN Rust-Chester, Micheline Rough L, NP       polyvinyl alcohol (LIQUIFILM TEARS) 1.4 % ophthalmic solution 1 drop  1 drop Both Eyes QID PRN Arnetha Courser, MD         Discharge Medications: Please see discharge summary for a list of discharge medications.  STOP taking these medications     amLODipine 10 MG tablet Commonly known as: NORVASC    aspirin EC 81 MG tablet    atorvastatin 80 MG tablet Commonly known as: LIPITOR    FLUoxetine 20 MG capsule Commonly known as: PROZAC    multivitamin with minerals Tabs tablet           TAKE these medications     acetaminophen 325 MG tablet Commonly known as: TYLENOL Take 2 tablets (650 mg total) by mouth every 6 (six) hours as needed for mild pain or fever.    antiseptic oral rinse Liqd Apply 15 mLs topically as needed for dry mouth.    hyoscyamine 0.125 MG SL tablet Commonly known as: LEVSIN SL Place 1 tablet (0.125 mg  total) under the tongue every 4 (four) hours as needed (excess oral secretions).    ipratropium-albuterol 0.5-2.5 (3) MG/3ML Soln Commonly known as: DUONEB Take 3 mLs by nebulization every 4 (four) hours as needed (shortness of breath.).    melatonin 3 MG Tabs tablet Take 6 mg by mouth at bedtime.    ondansetron 4 MG disintegrating tablet Commonly known as: ZOFRAN-ODT Take 1 tablet (4 mg total) by mouth every 6 (six) hours as  needed for nausea.    polyethylene glycol 17 g packet Commonly known as: MIRALAX / GLYCOLAX Take 17 g by mouth daily as needed for moderate constipation.    polyvinyl alcohol 1.4 % ophthalmic solution Commonly known as: LIQUIFILM TEARS Place 1 drop into both eyes 4 (four) times daily as needed for dry eyes.    senna 8.6 MG Tabs tablet Commonly known as: SENOKOT Take 1 tablet (8.6 mg total) by mouth daily as needed for mild constipation.     Relevant Imaging Results:  Relevant Lab Results:   Additional Information SS: [id number]  Allena Katz, LCSW

## 2023-06-03 NOTE — Progress Notes (Signed)
Pt resting in bed with eyes closed, no s/s of any pain or distress noted.

## 2023-06-03 NOTE — Progress Notes (Signed)
Progress Note   Patient: Charles Sims MVH:[id number] DOB: 07/28/1930 DOA: 05/28/2023     6 DOS: the patient was seen and examined on 06/03/2023   Brief hospital course: ICU transfer for 06/01/2023.  Taken from prior notes.  89 y.o. male with past medical history of CAD, carotid artery stenosis, HLD, HTN, HF, CKD, CVA, a fib, and memory loss admitted on 05/28/2023 with progressive AMS. Patient found to be hypotensive and tachypneic upon arrival to ED - d/t concerns about ability to protect airway patient was intubated and also required pressors. CTH negative, CXR clear. Labs significant for leukopenia, anemia, AKI, mild NAGMA, hypoalbuminemia with significant lactic acidosis and UTI.  CT angio GI bleed done on 7/23 with no contrast extravasation to localize GI bleed.  No other significant abnormality, bladder wall thickening and large stool burden.  Patient was recently discharged on 05/22/2023, at which point he was transferred to [facility] after PT/OT had recommended SNF placement. This recent admission was after a fall and during admission he was noted to have a significant hemoglobin drop but family declined further GI workup due to overall decline.   Patient was also seen on 05/27/2023 in the ED due to concern of suspected hematemesis. On 7/22 Hgb was 7.1, no signs of bleeding noted, negative rectal exam.   Patient was treated for septic shock, secondary to E. coli bacteremia, shows only resistant to ampicillin and Augmentin.  Urine culture was also positive for the same bacteria.  CSF cultures remain negative.  Patient was treated with ceftriaxone.  Also received 1 unit of PRBC due to acute on chronic anemia.  Patient was extubated and weaned off from pressors on 7/26 and care was transferred to [facility] from 06/01/2023.  Palliative care was also involved.  7/27: Vital stable, on room air.  Stable but persistent leukocytosis at 35.4, hemoglobin 8.2.  Worsening renal function with creatinine at  2.39(baseline appears to be around 1.4-1.7). Agitation and some fidgetiness reported requiring multiple doses of Haldol yesterday. Patient was on high doses of Solu-Cortef-started tapering.  Refusing most of the p.o. intake, not following any commands.  Lengthy discussion with son who will discuss with family and if patient does not started showing any sign of recovery soon, likely transitioning to comfort care will be the best option.  I discourage attempting another intubation at this time as it will not be fruitful.  Patient is very high risk for deterioration and mortality based on advanced age, advanced dementia at baseline, underlying comorbidities and recent septic shock.  7/28: Patient was transitioned to full comfort care by family yesterday evening.  Family is requesting transferring to hospice facility.  Hospice facility was consulted-pending evaluation.  7/29: Patient remained pretty much unresponsive but appears comfortable.  Family now wants him to go to [facility] with hospice as his wife is over there.  They will have a room for him tomorrow.   Assessment and Plan: * Acute metabolic encephalopathy Patient remains significantly encephalopathic, recent diagnosis of severe sepsis with septic shock requiring pressors.  Currently off the pressors.  Not following any commands, appears comfortable. Patient was transitioned to full comfort care yesterday evening. Family is not requesting transferring to hospice facility. Pending evaluation and approval for hospice home  Septic shock due to Escherichia coli [facility]) Patient recently admitted in ICU with severe sepsis and septic shock requiring intubation and pressors, secondary to E. coli UTI and bacteremia. Off the pressors now. -Patient is now comfort care  Essential hypertension Blood  pressure currently on softer side. -Keep holding home antihypertensives -Patient is now comfort care  Chronic diastolic CHF (congestive heart  failure) (HCC) Echocardiogram done in March 2024 with low normal EF of 50 to 55% and grade 1 diastolic dysfunction.  Clinically appears euvolemic. -Monitor volume status  HLD (hyperlipidemia) -Continue Lipitor if he can take p.o.  Depression -Continue home Prozac  Protein-calorie malnutrition, severe Dietitian consult  Iron deficiency anemia due to chronic blood loss Concern of GI bleed but family does not want any further workup. CTA abdomen for GI bleed concern was negative during current hospitalization. Received 1 unit of PRBC -Hemoglobin currently stable at 8.2 -Monitor hemoglobin -Transfuse if below 7      Subjective: Patient pretty much unresponsive, appears comfortable.  Physical Exam: Vitals:   06/02/23 0300 06/02/23 1622 06/03/23 0708 06/03/23 0728  BP:  (!) 163/70  (!) 149/94  Pulse: 70 93  82  Resp: (!) 8 12  14   Temp:  (!) 97.5 F (36.4 C)  97.8 F (36.6 C)  TempSrc:  Oral  Oral  SpO2: 97% 97%  97%  Weight:   67.2 kg   Height:       General.  Frail and unresponsive elderly man, in no acute distress. Pulmonary.  Lungs clear bilaterally, normal respiratory effort. CV.  Regular rate and rhythm, no JVD, rub or murmur. Abdomen.  Soft, nontender, nondistended, BS positive. CNS.  Unresponsive Extremities.  No edema, no cyanosis, pulses intact and symmetrical.  Data Reviewed: Prior data reviewed  Family Communication:   Disposition: Status is: Inpatient Remains inpatient appropriate because: Severity of illness  Planned Discharge Destination: Hospice facility versus hospital death.  Not likely be going to [facility] with hospice tomorrow. Time spent: 42 minutes   This record has been created using Conservation officer, historic buildings. Errors have been sought and corrected,but may not always be located. Such creation errors do not reflect on the standard of care.   Author: Arnetha Courser, MD 06/03/2023 1:05 PM  For on call review [email or web address].

## 2023-06-03 NOTE — Plan of Care (Signed)
  Problem: Education: Goal: Knowledge of General Education information will improve Description: Including pain rating scale, medication(s)/side effects and non-pharmacologic comfort measures Outcome: Progressing   Problem: Health Behavior/Discharge Planning: Goal: Ability to manage health-related needs will improve Outcome: Progressing   Problem: Clinical Measurements: Goal: Respiratory complications will improve Outcome: Progressing   Problem: Elimination: Goal: Will not experience complications related to bowel motility Outcome: Progressing   Problem: Safety: Goal: Ability to remain free from injury will improve Outcome: Progressing

## 2023-06-03 NOTE — Progress Notes (Signed)
Nutrition Brief Note  Chart reviewed. Pt transitioned to comfort care on 06/02/23 per MD notes. Pt requesting transfer to inpatient hospice facility.  No further nutrition interventions planned at this time.  Please re-consult as needed.   Levada Schilling, RD, LDN, CDCES Registered Dietitian II Certified Diabetes Care and Education Specialist Please refer to [facility] for RD and/or RD on-call/weekend/after hours pager

## 2023-06-04 DIAGNOSIS — G9341 Metabolic encephalopathy: Secondary | ICD-10-CM | POA: Diagnosis not present

## 2023-06-04 DIAGNOSIS — N3000 Acute cystitis without hematuria: Secondary | ICD-10-CM

## 2023-06-04 DIAGNOSIS — G9340 Encephalopathy, unspecified: Secondary | ICD-10-CM | POA: Diagnosis not present

## 2023-06-04 DIAGNOSIS — D649 Anemia, unspecified: Secondary | ICD-10-CM | POA: Diagnosis not present

## 2023-06-04 MED ORDER — POLYVINYL ALCOHOL 1.4 % OP SOLN: 1.0000 [drp] | Freq: Four times a day (QID) | OPHTHALMIC | Status: DC | PRN

## 2023-06-04 MED ORDER — ONDANSETRON 4 MG PO TBDP: 4.0000 mg | ORAL_TABLET | Freq: Four times a day (QID) | ORAL | Status: DC | PRN

## 2023-06-04 MED ORDER — IPRATROPIUM-ALBUTEROL 0.5-2.5 (3) MG/3ML IN SOLN: 3.0000 mL | RESPIRATORY_TRACT | Status: DC | PRN

## 2023-06-04 MED ORDER — HYOSCYAMINE SULFATE 0.125 MG SL SUBL: 0.1250 mg | SUBLINGUAL_TABLET | SUBLINGUAL | Status: DC | PRN

## 2023-06-04 MED ORDER — BIOTENE DRY MOUTH MT LIQD: 15.0000 mL | OROMUCOSAL | Status: DC | PRN

## 2023-06-04 NOTE — Discharge Summary (Signed)
Physician Discharge Summary   Patient: Charles Sims MRN: [id number] DOB: 1930/06/13  Admit date:     05/28/2023  Discharge date: 06/04/23  Discharge Physician: Arnetha Courser   PCP: Joaquim Nam, MD   Recommendations at discharge:  Patient is being discharged to [facility] with hospice services  Discharge Diagnoses: Principal Problem:   Acute metabolic encephalopathy Active Problems:   Septic shock due to Escherichia coli [facility])   Essential hypertension   Chronic diastolic CHF (congestive heart failure) (HCC)   HLD (hyperlipidemia)   Depression   Acute anemia   Protein-calorie malnutrition, severe   Iron deficiency anemia due to chronic blood loss   Palliative care encounter   Acute encephalopathy   Acute cystitis without hematuria   Hospital Course: ICU transfer for 06/01/2023.  Taken from prior notes.  89 y.o. male with past medical history of CAD, carotid artery stenosis, HLD, HTN, HF, CKD, CVA, a fib, and memory loss admitted on 05/28/2023 with progressive AMS. Patient found to be hypotensive and tachypneic upon arrival to ED - d/t concerns about ability to protect airway patient was intubated and also required pressors. CTH negative, CXR clear. Labs significant for leukopenia, anemia, AKI, mild NAGMA, hypoalbuminemia with significant lactic acidosis and UTI.  CT angio GI bleed done on 7/23 with no contrast extravasation to localize GI bleed.  No other significant abnormality, bladder wall thickening and large stool burden.  Patient was recently discharged on 05/22/2023, at which point he was transferred to [facility] after PT/OT had recommended SNF placement. This recent admission was after a fall and during admission he was noted to have a significant hemoglobin drop but family declined further GI workup due to overall decline.   Patient was also seen on 05/27/2023 in the ED due to concern of suspected hematemesis. On 7/22 Hgb was 7.1, no signs of bleeding noted,  negative rectal exam.   Patient was treated for septic shock, secondary to E. coli bacteremia, shows only resistant to ampicillin and Augmentin.  Urine culture was also positive for the same bacteria.  CSF cultures remain negative.  Patient was treated with ceftriaxone.  Also received 1 unit of PRBC due to acute on chronic anemia.  Patient was extubated and weaned off from pressors on 7/26 and care was transferred to [facility] from 06/01/2023.  Palliative care was also involved.  7/27: Vital stable, on room air.  Stable but persistent leukocytosis at 35.4, hemoglobin 8.2.  Worsening renal function with creatinine at 2.39(baseline appears to be around 1.4-1.7). Agitation and some fidgetiness reported requiring multiple doses of Haldol yesterday. Patient was on high doses of Solu-Cortef-started tapering.  Refusing most of the p.o. intake, not following any commands.  Lengthy discussion with son who will discuss with family and if patient does not started showing any sign of recovery soon, likely transitioning to comfort care will be the best option.  I discourage attempting another intubation at this time as it will not be fruitful.  Patient is very high risk for deterioration and mortality based on advanced age, advanced dementia at baseline, underlying comorbidities and recent septic shock.  7/28: Patient was transitioned to full comfort care by family yesterday evening.  Family is requesting transferring to hospice facility.  Hospice facility was consulted-pending evaluation.  7/29: Patient remained pretty much unresponsive but appears comfortable.  Family now wants him to go to [facility] with hospice as his wife is over there.  They will have a room for him tomorrow.  7/30:  Patient currently stable and appears comfortable.  He is being discharged to [facility] as requested by family with hospice help.  [facility] and will provide the necessary hospice medications for  comfort.  Patient has a very grave prognosis.  Assessment and Plan: * Acute metabolic encephalopathy Patient remains significantly encephalopathic, recent diagnosis of severe sepsis with septic shock requiring pressors.  Currently off the pressors.  Not following any commands, appears comfortable. Patient was transitioned to full comfort care yesterday evening. Family is not requesting transferring to hospice facility. Pending evaluation and approval for hospice home  Septic shock due to Escherichia coli [facility]) Patient recently admitted in ICU with severe sepsis and septic shock requiring intubation and pressors, secondary to E. coli UTI and bacteremia. Off the pressors now. -Patient is now comfort care  Essential hypertension Blood pressure currently on softer side. -Keep holding home antihypertensives -Patient is now comfort care  Chronic diastolic CHF (congestive heart failure) (HCC) Echocardiogram done in March 2024 with low normal EF of 50 to 55% and grade 1 diastolic dysfunction.  Clinically appears euvolemic. -Monitor volume status  HLD (hyperlipidemia) -Continue Lipitor if he can take p.o.  Depression -Continue home Prozac  Protein-calorie malnutrition, severe Dietitian consult  Iron deficiency anemia due to chronic blood loss Concern of GI bleed but family does not want any further workup. CTA abdomen for GI bleed concern was negative during current hospitalization. Received 1 unit of PRBC -Hemoglobin currently stable at 8.2 -Monitor hemoglobin -Transfuse if below 7         Consultants: PCCM.  Palliative care Procedures performed: None Disposition: Hospice care Diet recommendation:  Discharge Diet Orders (From admission, onward)     Start     Ordered   06/04/23 0000  Diet - low sodium heart healthy        06/04/23 0942           Regular diet DISCHARGE MEDICATION: Allergies as of 06/04/2023       Reactions   Lisinopril Cough         Medication List     STOP taking these medications    amLODipine 10 MG tablet Commonly known as: NORVASC   aspirin EC 81 MG tablet   atorvastatin 80 MG tablet Commonly known as: LIPITOR   FLUoxetine 20 MG capsule Commonly known as: PROZAC   multivitamin with minerals Tabs tablet       TAKE these medications    acetaminophen 325 MG tablet Commonly known as: TYLENOL Take 2 tablets (650 mg total) by mouth every 6 (six) hours as needed for mild pain or fever.   antiseptic oral rinse Liqd Apply 15 mLs topically as needed for dry mouth.   hyoscyamine 0.125 MG SL tablet Commonly known as: LEVSIN SL Place 1 tablet (0.125 mg total) under the tongue every 4 (four) hours as needed (excess oral secretions).   ipratropium-albuterol 0.5-2.5 (3) MG/3ML Soln Commonly known as: DUONEB Take 3 mLs by nebulization every 4 (four) hours as needed (shortness of breath.).   melatonin 3 MG Tabs tablet Take 6 mg by mouth at bedtime.   ondansetron 4 MG disintegrating tablet Commonly known as: ZOFRAN-ODT Take 1 tablet (4 mg total) by mouth every 6 (six) hours as needed for nausea.   polyethylene glycol 17 g packet Commonly known as: MIRALAX / GLYCOLAX Take 17 g by mouth daily as needed for moderate constipation.   polyvinyl alcohol 1.4 % ophthalmic solution Commonly known as: LIQUIFILM TEARS Place 1 drop  into both eyes 4 (four) times daily as needed for dry eyes.   senna 8.6 MG Tabs tablet Commonly known as: SENOKOT Take 1 tablet (8.6 mg total) by mouth daily as needed for mild constipation.               Discharge Care Instructions  (From admission, onward)           Start     Ordered   06/04/23 0000  No dressing needed        06/04/23 1610            Follow-up Information     Joaquim Nam, MD Follow up.   Specialty: Family Medicine Contact information: [address] [place] [address] [phone]                Discharge  Exam: Ceasar Mons Weights   06/01/23 0354 06/03/23 0708 06/04/23 0855  Weight: 71.8 kg 67.2 kg 63.2 kg   General.  Frail and unresponsive gentleman, in no acute distress. Pulmonary.  Lungs clear bilaterally, normal respiratory effort. CV.  Regular rate and rhythm, no JVD, rub or murmur. Abdomen.  Soft, nontender, nondistended, BS positive. CNS.  Unresponsive, not following any commands Extremities.  No edema, no cyanosis, pulses intact and symmetrical..   Condition at discharge: poor  The results of significant diagnostics from this hospitalization (including imaging, microbiology, ancillary and laboratory) are listed below for reference.   Imaging Studies: CT ANGIO GI BLEED  Result Date: 05/28/2023 CLINICAL DATA:  Sepsis, lower GI bleed EXAM: CTA ABDOMEN AND PELVIS WITHOUT AND WITH CONTRAST TECHNIQUE: Multidetector CT imaging of the abdomen and pelvis was performed using the standard protocol during bolus administration of intravenous contrast. Multiplanar reconstructed images and MIPs were obtained and reviewed to evaluate the vascular anatomy. RADIATION DOSE REDUCTION: This exam was performed according to the departmental dose-optimization program which includes automated exposure control, adjustment of the mA and/or kV according to patient size and/or use of iterative reconstruction technique. CONTRAST:  75mL OMNIPAQUE IOHEXOL 350 MG/ML SOLN COMPARISON:  None Available. FINDINGS: VASCULAR Aorta: Aortic atherosclerosis.  No aneurysm or dissection. Celiac: Widely patent SMA: Widely patent Renals: Single right renal artery and 2 left renal arteries. No visible significant stenosis. IMA: Widely patent Inflow: Atherosclerotic calcifications throughout the iliac vessels. No aneurysm or dissection. Proximal Outflow: Atherosclerotic calcifications. No aneurysm or dissection. Veins: No obvious venous abnormality within the limitations of this arterial phase study. Review of the MIP images confirms the above  findings. NON-VASCULAR Lower chest: Trace bilateral pleural effusions. Dependent bibasilar atelectasis. Scattered coronary artery and aortic atherosclerosis. Hepatobiliary: Small amount of pericholecystic fluid. No visible stones or biliary ductal dilatation. No focal hepatic abnormality. Pancreas: No focal abnormality or ductal dilatation. Spleen: No focal abnormality.  Normal size. Adrenals/Urinary Tract: Urinary bladder is decompressed with Foley catheter in place. Bladder wall appears thickened, likely related to bladder outlet obstruction. No hydronephrosis. No suspicious renal or adrenal mass. Stomach/Bowel: Large stool burden in the rectosigmoid colon. Sigmoid diverticulosis. No active diverticulitis. No visible contrast extravasation to localize GI bleed. Stomach and small bowel decompressed, unremarkable. NG tube in the stomach. Lymphatic: No adenopathy Reproductive: Marked prostate enlargement. Other: No free fluid or free air. Musculoskeletal: No acute bony abnormality. IMPRESSION: VASCULAR Aortoiliac atherosclerosis.  No aneurysm or dissection. No contrast extravasation to localize GI bleed. NON-VASCULAR Small amount of pericholecystic fluid. No visible stones or gallbladder wall thickening. Bladder wall thickening likely related to bladder outlet obstruction. Large stool burden in  the rectosigmoid colon. Trace bilateral effusions, bibasilar atelectasis. Electronically Signed   By: Charlett Nose M.D.   On: 05/28/2023 22:21   CT Chest Wo Contrast  Result Date: 05/28/2023 CLINICAL DATA:  Sepsis. EXAM: CT CHEST WITHOUT CONTRAST TECHNIQUE: Multidetector CT imaging of the chest was performed following the standard protocol without IV contrast. RADIATION DOSE REDUCTION: This exam was performed according to the departmental dose-optimization program which includes automated exposure control, adjustment of the mA and/or kV according to patient size and/or use of iterative reconstruction technique. COMPARISON:   None Available. FINDINGS: Cardiovascular: The heart is mildly enlarged. There is no pericardial effusion. Aorta is normal in size. There are atherosclerotic calcifications of the aorta and coronary arteries. Mediastinum/Nodes: The tip of the endotracheal tube is 1.8 cm above the carina. Visualized thyroid gland is within normal limits. Enteric tube is seen throughout the esophagus. There are no enlarged mediastinal or hilar lymph nodes allowing for lack of intravenous contrast. Lungs/Pleura: There are trace bilateral pleural effusions. There some patchy airspace opacities in the inferior right lower lobe and minimal atelectatic changes in the left lung base. Additional atelectatic changes are seen in the inferior right upper lobe. Trachea and central airways are patent. Upper Abdomen: No acute abnormality. Musculoskeletal: There are acute/subacute nondisplaced posterior sixth through tenth rib fractures. There is DISH of the thoracic spine. IMPRESSION: 1. Trace bilateral pleural effusions. 2. Patchy airspace opacities in the inferior right lower lobe worrisome for pneumonia. 3. Acute/subacute right posterior sixth through tenth rib fractures. 4. Mild cardiomegaly. Aortic Atherosclerosis (ICD10-I70.0). Electronically Signed   By: Darliss Cheney M.D.   On: 05/28/2023 22:19   DG Chest Portable 1 View  Result Date: 05/28/2023 CLINICAL DATA:  Status post orogastric tube placement. EXAM: PORTABLE CHEST 1 VIEW COMPARISON:  May 28, 2023 (7:07 p.m.) FINDINGS: An endotracheal tube is seen with its distal tip approximately 2.8 cm from the carina. An orogastric tube is noted with its distal end seen within the body of the stomach. The distal side hole sits approximately 4.7 cm distal to the expected region of the gastroesophageal junction. The heart size and mediastinal contours are within normal limits. Low lung volumes are noted, without evidence of an acute infiltrate, pleural effusion or pneumothorax. Multilevel  degenerative changes are noted throughout the thoracic spine. IMPRESSION: 1. Endotracheal and orogastric tube positioning, as described above. 2. Low lung volumes without acute or active cardiopulmonary disease. Electronically Signed   By: Aram Candela M.D.   On: 05/28/2023 20:47   CT Head Wo Contrast  Result Date: 05/28/2023 CLINICAL DATA:  Altered mental status EXAM: CT HEAD WITHOUT CONTRAST TECHNIQUE: Contiguous axial images were obtained from the base of the skull through the vertex without intravenous contrast. RADIATION DOSE REDUCTION: This exam was performed according to the departmental dose-optimization program which includes automated exposure control, adjustment of the mA and/or kV according to patient size and/or use of iterative reconstruction technique. COMPARISON:  MRI 05/17/2023, CT 05/17/2023 FINDINGS: Brain: No acute territorial infarction, hemorrhage or intracranial mass. Atrophy. Advanced chronic small vessel ischemic changes of the white matter. Small chronic left posterior frontal infarct. Stable ventricle size. Vascular: No hyperdense vessels. Vertebral and carotid vascular calcification Skull: Normal. Negative for fracture or focal lesion. Sinuses/Orbits: No acute finding. Other: None IMPRESSION: 1. No CT evidence for acute intracranial abnormality. 2. Atrophy and advanced chronic small vessel ischemic changes of the white matter. Electronically Signed   By: Jasmine Pang M.D.   On: 05/28/2023 19:55   DG  Chest 1 View  Result Date: 05/28/2023 CLINICAL DATA:  Post intubation EXAM: CHEST  1 VIEW COMPARISON:  05/28/2023, 05/17/2023 FINDINGS: Interval intubation, tip of the endotracheal tube is 2.6 cm superior to carina. No acute airspace disease or pleural effusion. Stable cardiomediastinal silhouette with aortic atherosclerosis. No pneumothorax IMPRESSION: Endotracheal tube tip 2.6 cm superior to carina. Electronically Signed   By: Jasmine Pang M.D.   On: 05/28/2023 19:47   DG  Chest Portable 1 View  Result Date: 05/28/2023 CLINICAL DATA:  Possible sepsis. EXAM: PORTABLE CHEST 1 VIEW COMPARISON:  05/17/2023 and prior radiographs FINDINGS: The cardiomediastinal silhouette is unchanged. Mild pulmonary vascular congestion is identified. There is no evidence of focal airspace disease, pulmonary edema, suspicious pulmonary nodule/mass, pleural effusion, or pneumothorax. No acute bony abnormalities are identified. IMPRESSION: Mild pulmonary vascular congestion. Electronically Signed   By: Harmon Pier M.D.   On: 05/28/2023 17:39   MR BRAIN WO CONTRAST  Result Date: 05/17/2023 CLINICAL DATA:  Mental status change, unknown cause EXAM: MRI HEAD WITHOUT CONTRAST TECHNIQUE: Multiplanar, multiecho pulse sequences of the brain and surrounding structures were obtained without intravenous contrast. COMPARISON:  MRI January 24, 2023. FINDINGS: Brain: No acute infarction, hemorrhage, hydrocephalus, extra-axial collection or mass lesion. Moderate T2/FLAIR hyperintense in the white matter, compatible with chronic microvascular disease. Cerebral atrophy. Vascular: Major arterial voids are maintained at the skull base. Skull and upper cervical spine: Normal marrow signal. Sinuses/Orbits: Mild paranasal sinus mucosal thickening. No acute orbital findings. Other: No mastoid effusions. IMPRESSION: 1. No evidence of acute intracranial. 2. Chronic microvascular ischemic disease and cerebral atrophy (ICD10-G31.9). Electronically Signed   By: Feliberto Harts M.D.   On: 05/17/2023 14:25   DG Chest 2 View  Result Date: 05/17/2023 CLINICAL DATA:  89 year old male with history of weakness. EXAM: CHEST - 2 VIEW COMPARISON:  Chest x-ray 01/24/2023. FINDINGS: Skin fold artifact projecting over the lateral aspect of the right hemithorax. Lung volumes are low. No consolidative airspace disease. No pleural effusions. No pneumothorax. No pulmonary nodule or mass noted. Pulmonary vasculature and the cardiomediastinal  silhouette are within normal limits. Atherosclerotic calcifications are noted in the thoracic aorta. IMPRESSION: 1. Low lung volumes without radiographic evidence of acute cardiopulmonary disease. 2. Aortic atherosclerosis. Electronically Signed   By: Trudie Reed M.D.   On: 05/17/2023 07:55   CT Cervical Spine Wo Contrast  Result Date: 05/17/2023 CLINICAL DATA:  89 year old male found down this morning. Weakness and strong urine odor. EXAM: CT CERVICAL SPINE WITHOUT CONTRAST TECHNIQUE: Multidetector CT imaging of the cervical spine was performed without intravenous contrast. Multiplanar CT image reconstructions were also generated. RADIATION DOSE REDUCTION: This exam was performed according to the departmental dose-optimization program which includes automated exposure control, adjustment of the mA and/or kV according to patient size and/or use of iterative reconstruction technique. COMPARISON:  Cervical spine CT 05/08/2022. FINDINGS: Alignment: Stable since last year. Straightening of upper cervical lordosis. Bilateral posterior element alignment is within normal limits. Cervicothoracic junction alignment is within normal limits. Skull base and vertebrae: Bone mineralization is within normal limits for age. Visualized skull base is intact. No atlanto-occipital dissociation. C1 and C2 appear intact and aligned. No acute osseous abnormality identified. Soft tissues and spinal canal: No prevertebral fluid or swelling. No visible canal hematoma. Chronic left carotid space surgical clips. Chronic right suboccipital sebaceous cyst. Disc levels: Chronic cervical spine degeneration, including facet arthropathy. Degenerative left C2-C3 facet ankylosis is newly developing since last year. Multifactorial spinal stenosis appears stable, probably maximal at C5-C6.  Upper chest: Visible upper thoracic levels appears stable and intact. Negative lung apices. IMPRESSION: 1. No acute traumatic injury identified in the  cervical spine. 2. Chronic cervical spine degeneration with chronic spinal stenosis and developing left C2-C3 facet ankylosis. Electronically Signed   By: Odessa Fleming M.D.   On: 05/17/2023 07:48   CT Head Wo Contrast  Result Date: 05/17/2023 CLINICAL DATA:  89 year old male found down this morning. Weakness and strong urine odor. EXAM: CT HEAD WITHOUT CONTRAST TECHNIQUE: Contiguous axial images were obtained from the base of the skull through the vertex without intravenous contrast. RADIATION DOSE REDUCTION: This exam was performed according to the departmental dose-optimization program which includes automated exposure control, adjustment of the mA and/or kV according to patient size and/or use of iterative reconstruction technique. COMPARISON:  Brain MRI 01/24/2023. Head CT 01/25/2023. FINDINGS: Brain: Stable cerebral volume. No midline shift, ventriculomegaly, mass effect, evidence of mass lesion, intracranial hemorrhage or evidence of cortically based acute infarction. Confluent bilateral cerebral white matter hypodensity with a small area of left superior perirolandic cortical encephalomalacia is stable. Vascular: Calcified atherosclerosis at the skull base. No suspicious intracranial vascular hyperdensity. Skull: No acute osseous abnormality identified.  TMJ degeneration. Sinuses/Orbits: Visualized paranasal sinuses and mastoids are stable and well aerated. Other: No acute orbit or scalp soft tissue injury is identified. Chronic right suboccipital sebaceous cyst in some scalp vessel calcified atherosclerosis. IMPRESSION: 1. No acute intracranial abnormality or acute traumatic injury identified. 2.  Stable non contrast CT appearance of chronic ischemic disease. Electronically Signed   By: Odessa Fleming M.D.   On: 05/17/2023 07:45    Microbiology: Results for orders placed or performed during the hospital encounter of 05/28/23  Culture, blood (Routine x 2)     Status: Abnormal   Collection Time: 05/28/23  4:44 PM    Specimen: BLOOD LEFT FOREARM  Result Value Ref Range Status   Specimen Description   Final    BLOOD LEFT FOREARM Performed at [facility], [address] [place], [place] [address]    Special Requests   Final    BOTTLES DRAWN AEROBIC AND ANAEROBIC Blood Culture adequate volume Performed at [facility], [address] [place], [place] [address]    Culture  Setup Time   Final    GRAM NEGATIVE RODS ANAEROBIC BOTTLE ONLY CRITICAL VALUE NOTED.  VALUE IS CONSISTENT WITH PREVIOUSLY REPORTED AND CALLED VALUE. Performed at [facility], [address] [place], [place] [address]    Culture ESCHERICHIA COLI (A)  Final   Report Status 05/31/2023 FINAL  Final  Culture, blood (Routine x 2)     Status: Abnormal   Collection Time: 05/28/23  4:52 PM   Specimen: BLOOD LEFT ARM  Result Value Ref Range Status   Specimen Description   Final    BLOOD LEFT ARM Performed at [facility], [address], [place], [place] [address]    Special Requests   Final    BOTTLES DRAWN AEROBIC AND ANAEROBIC Blood Culture adequate volume Performed at [facility], [address] [place], [place] [address]    Culture  Setup Time   Final    Organism ID to follow GRAM NEGATIVE RODS IN BOTH AEROBIC AND ANAEROBIC BOTTLES CRITICAL RESULT CALLED TO, READ BACK BY AND VERIFIED WITH: NATHAN BELUE PHARMD @0428  05/29/23 ASW Performed at [facility], [address] [place], [place] [address]    Culture ESCHERICHIA COLI (A)  Final   Report Status 05/31/2023  FINAL  Final   Organism ID, Bacteria ESCHERICHIA COLI  Final      Susceptibility   Escherichia coli - MIC*    AMPICILLIN >=32 RESISTANT Resistant     CEFEPIME <=0.12 SENSITIVE Sensitive     CEFTAZIDIME <=1 SENSITIVE Sensitive     CEFTRIAXONE <=0.25 SENSITIVE Sensitive     CIPROFLOXACIN <=0.25 SENSITIVE Sensitive     GENTAMICIN <=1 SENSITIVE Sensitive     IMIPENEM <=0.25 SENSITIVE Sensitive      TRIMETH/SULFA <=20 SENSITIVE Sensitive     AMPICILLIN/SULBACTAM 16 INTERMEDIATE Intermediate     PIP/TAZO <=4 SENSITIVE Sensitive     * ESCHERICHIA COLI  Blood Culture ID Panel (Reflexed)     Status: Abnormal   Collection Time: 05/28/23  4:52 PM  Result Value Ref Range Status   Enterococcus faecalis NOT DETECTED NOT DETECTED Final   Enterococcus Faecium NOT DETECTED NOT DETECTED Final   Listeria monocytogenes NOT DETECTED NOT DETECTED Final   Staphylococcus species NOT DETECTED NOT DETECTED Final   Staphylococcus aureus (BCID) NOT DETECTED NOT DETECTED Final   Staphylococcus epidermidis NOT DETECTED NOT DETECTED Final   Staphylococcus lugdunensis NOT DETECTED NOT DETECTED Final   Streptococcus species NOT DETECTED NOT DETECTED Final   Streptococcus agalactiae NOT DETECTED NOT DETECTED Final   Streptococcus pneumoniae NOT DETECTED NOT DETECTED Final   Streptococcus pyogenes NOT DETECTED NOT DETECTED Final   A.calcoaceticus-baumannii NOT DETECTED NOT DETECTED Final   Bacteroides fragilis NOT DETECTED NOT DETECTED Final   Enterobacterales DETECTED (A) NOT DETECTED Final    Comment: Enterobacterales represent a large order of gram negative bacteria, not a single organism. CRITICAL RESULT CALLED TO, READ BACK BY AND VERIFIED WITH: NATHAN BELUE PHARMD @0428  05/29/23 ASW    Enterobacter cloacae complex NOT DETECTED NOT DETECTED Final   Escherichia coli DETECTED (A) NOT DETECTED Final    Comment: CRITICAL RESULT CALLED TO, READ BACK BY AND VERIFIED WITH: NATHAN BELUE PHARMD @0428  05/29/23 ASW    Klebsiella aerogenes NOT DETECTED NOT DETECTED Final   Klebsiella oxytoca NOT DETECTED NOT DETECTED Final   Klebsiella pneumoniae NOT DETECTED NOT DETECTED Final   Proteus species NOT DETECTED NOT DETECTED Final   Salmonella species NOT DETECTED NOT DETECTED Final   Serratia marcescens NOT DETECTED NOT DETECTED Final   Haemophilus influenzae NOT DETECTED NOT DETECTED Final   Neisseria meningitidis  NOT DETECTED NOT DETECTED Final   Pseudomonas aeruginosa NOT DETECTED NOT DETECTED Final   Stenotrophomonas maltophilia NOT DETECTED NOT DETECTED Final   Candida albicans NOT DETECTED NOT DETECTED Final   Candida auris NOT DETECTED NOT DETECTED Final   Candida glabrata NOT DETECTED NOT DETECTED Final   Candida krusei NOT DETECTED NOT DETECTED Final   Candida parapsilosis NOT DETECTED NOT DETECTED Final   Candida tropicalis NOT DETECTED NOT DETECTED Final   Cryptococcus neoformans/gattii NOT DETECTED NOT DETECTED Final   CTX-M ESBL NOT DETECTED NOT DETECTED Final   Carbapenem resistance IMP NOT DETECTED NOT DETECTED Final   Carbapenem resistance KPC NOT DETECTED NOT DETECTED Final   Carbapenem resistance NDM NOT DETECTED NOT DETECTED Final   Carbapenem resist OXA 48 LIKE NOT DETECTED NOT DETECTED Final   Carbapenem resistance VIM NOT DETECTED NOT DETECTED Final    Comment: Performed at [facility], [address] [place], [place] [address]  Resp panel by RT-PCR (RSV, Flu A&B, Covid) Anterior Nasal Swab     Status: None   Collection Time: 05/28/23  5:23 PM   Specimen: Anterior Nasal Swab  Result Value Ref Range Status   SARS Coronavirus 2 by RT PCR NEGATIVE NEGATIVE Final    Comment: (NOTE) SARS-CoV-2 target nucleic acids are NOT DETECTED.  The SARS-CoV-2 RNA is generally detectable in upper respiratory specimens during the acute phase of infection. The lowest concentration of SARS-CoV-2 viral copies this assay can detect is 138 copies/mL. A negative result does not preclude SARS-Cov-2 infection and should not be used as the sole basis for treatment or other patient management decisions. A negative result may occur with  improper specimen collection/handling, submission of specimen other than nasopharyngeal swab, presence of viral mutation(s) within the areas targeted by this assay, and inadequate number of viral copies(<138 copies/mL). A negative result must be combined  with clinical observations, patient history, and epidemiological information. The expected result is Negative.  Fact Sheet for Patients:  BloggerCourse.com  Fact Sheet for Healthcare Providers:  SeriousBroker.it  This test is no t yet approved or cleared by the [place] FDA and  has been authorized for detection and/or diagnosis of SARS-CoV-2 by FDA under an Emergency Use Authorization (EUA). This EUA will remain  in effect (meaning this test can be used) for the duration of the COVID-19 declaration under Section 564(b)(1) of the Act, 21 U.S.C.section 360bbb-3(b)(1), unless the authorization is terminated  or revoked sooner.       Influenza A by PCR NEGATIVE NEGATIVE Final   Influenza B by PCR NEGATIVE NEGATIVE Final    Comment: (NOTE) The Xpert Xpress SARS-CoV-2/FLU/RSV plus assay is intended as an aid in the diagnosis of influenza from Nasopharyngeal swab specimens and should not be used as a sole basis for treatment. Nasal washings and aspirates are unacceptable for Xpert Xpress SARS-CoV-2/FLU/RSV testing.  Fact Sheet for Patients: BloggerCourse.com  Fact Sheet for Healthcare Providers: SeriousBroker.it  This test is not yet approved or cleared by the [place] FDA and has been authorized for detection and/or diagnosis of SARS-CoV-2 by FDA under an Emergency Use Authorization (EUA). This EUA will remain in effect (meaning this test can be used) for the duration of the COVID-19 declaration under Section 564(b)(1) of the Act, 21 U.S.C. section 360bbb-3(b)(1), unless the authorization is terminated or revoked.     Resp Syncytial Virus by PCR NEGATIVE NEGATIVE Final    Comment: (NOTE) Fact Sheet for Patients: BloggerCourse.com  Fact Sheet for Healthcare Providers: SeriousBroker.it  This test is not yet approved  or cleared by the [place] FDA and has been authorized for detection and/or diagnosis of SARS-CoV-2 by FDA under an Emergency Use Authorization (EUA). This EUA will remain in effect (meaning this test can be used) for the duration of the COVID-19 declaration under Section 564(b)(1) of the Act, 21 U.S.C. section 360bbb-3(b)(1), unless the authorization is terminated or revoked.  Performed at [facility], [address] [place], [place] [address]   Urine Culture     Status: Abnormal   Collection Time: 05/28/23  5:23 PM   Specimen: Urine, Catheterized  Result Value Ref Range Status   Specimen Description   Final    URINE, CATHETERIZED Performed at [facility], [address] [place], [place] [address]    Special Requests   Final    NONE Reflexed from [id number] Performed at [facility], [address] [place], [place] [address]    Culture >=100,000 COLONIES/mL ESCHERICHIA COLI (A)  Final   Report Status 05/30/2023 FINAL  Final   Organism ID, Bacteria ESCHERICHIA COLI (A)  Final  Susceptibility   Escherichia coli - MIC*    AMPICILLIN >=32 RESISTANT Resistant     CEFAZOLIN <=4 SENSITIVE Sensitive     CEFEPIME <=0.12 SENSITIVE Sensitive     CEFTRIAXONE <=0.25 SENSITIVE Sensitive     CIPROFLOXACIN <=0.25 SENSITIVE Sensitive     GENTAMICIN <=1 SENSITIVE Sensitive     IMIPENEM <=0.25 SENSITIVE Sensitive     NITROFURANTOIN <=16 SENSITIVE Sensitive     TRIMETH/SULFA <=20 SENSITIVE Sensitive     AMPICILLIN/SULBACTAM 16 INTERMEDIATE Intermediate     PIP/TAZO <=4 SENSITIVE Sensitive     * >=100,000 COLONIES/mL ESCHERICHIA COLI  CSF culture w Gram Stain     Status: None   Collection Time: 05/28/23  8:55 PM   Specimen: CSF; Cerebrospinal Fluid  Result Value Ref Range Status   Specimen Description   Final    CSF LUMBAR PUNCTURE Performed at [facility], [address] [place], [place] [address]    Special Requests   Final     NONE Performed at [facility], [address] [place], [place] [address]    Gram Stain   Final    NO ORGANISMS SEEN WBC SEEN RED BLOOD CELLS PRESENT    Culture   Final    NO GROWTH 3 DAYS Performed at [facility], [address] [place], [place] [address]    Report Status 06/01/2023 FINAL  Final  MRSA Next Gen by PCR, Nasal     Status: None   Collection Time: 05/28/23 10:25 PM   Specimen: Nasal Mucosa; Nasal Swab  Result Value Ref Range Status   MRSA by PCR Next Gen NOT DETECTED NOT DETECTED Final    Comment: (NOTE) The GeneXpert MRSA Assay (FDA approved for NASAL specimens only), is one component of a comprehensive MRSA colonization surveillance program. It is not intended to diagnose MRSA infection nor to guide or monitor treatment for MRSA infections. Test performance is not FDA approved in patients less than 68 years old. Performed at [facility], [address] [place], [place] [address]   Culture, Respiratory w Gram Stain     Status: None   Collection Time: 05/29/23  4:17 PM   Specimen: Tracheal Aspirate; Respiratory  Result Value Ref Range Status   Specimen Description   Final    TRACHEAL ASPIRATE Performed at [facility], [address] [place], [place] [address]    Special Requests   Final    NONE Performed at [facility], [address] [place], [place] [address]    Gram Stain   Final    RARE WBC PRESENT,BOTH PMN AND MONONUCLEAR NO ORGANISMS SEEN Performed at [facility], [address], [place], [place] [address]    Culture RARE CANDIDA ALBICANS RARE PROTEUS MIRABILIS   Final   Report Status 06/01/2023 FINAL  Final   Organism ID, Bacteria PROTEUS MIRABILIS  Final      Susceptibility   Proteus mirabilis - MIC*    AMPICILLIN <=2 SENSITIVE Sensitive     CEFEPIME <=0.12 SENSITIVE Sensitive     CEFTAZIDIME <=1 SENSITIVE Sensitive     CEFTRIAXONE <=0.25 SENSITIVE Sensitive     CIPROFLOXACIN  <=0.25 SENSITIVE Sensitive     GENTAMICIN <=1 SENSITIVE Sensitive     IMIPENEM 2 SENSITIVE Sensitive     TRIMETH/SULFA <=20 SENSITIVE Sensitive     AMPICILLIN/SULBACTAM <=2 SENSITIVE Sensitive     PIP/TAZO <=4 SENSITIVE Sensitive     * RARE PROTEUS MIRABILIS  Labs: CBC: Recent Labs  Lab 05/28/23 1644 05/28/23 1711 05/29/23 0430 05/30/23 0438 05/31/23 0501 06/01/23 0621  WBC 3.2* 3.5* 33.9* 39.6* 36.8* 35.4*  NEUTROABS 2.2 2.3  --  30.0*  --   --   HGB 6.6* 6.4* 7.5* 7.3* 7.3* 8.2*  HCT 20.7* 18.7* 22.8* 21.2* 21.2* 23.0*  MCV 105.1* 97.9 98.3 94.6 94.6 92.7  PLT 335 321 291 237 218 209   Basic Metabolic Panel: Recent Labs  Lab 05/29/23 0429 05/29/23 1114 05/30/23 0438 05/30/23 1645 05/31/23 0501 05/31/23 1717 06/01/23 0621 06/01/23 1706  NA 132* 130* 131*  --  133*  --  135  --   K 4.2 3.8 3.9  --  3.7  --  4.0  --   CL 100 99 94*  --  96*  --  97*  --   CO2 21* 19* 26  --  27  --  26  --   GLUCOSE 171* 150* 148*  --  86  --  115*  --   BUN 40* 41* 44*  --  58*  --  68*  --   CREATININE 2.29* 2.17* 2.14*  --  2.23*  --  2.39*  --   CALCIUM 7.4* 7.3* 6.9*  --  7.1*  --  7.7*  --   MG 2.2  --  2.0 2.0  --  2.2  --  2.4  PHOS 3.3  --  3.6 2.9  --  3.6  --  3.9   Liver Function Tests: Recent Labs  Lab 05/28/23 1644 05/29/23 0429  AST 31 42*  ALT 14 17  ALKPHOS 99 76  BILITOT 0.8 2.0*  PROT 5.3* 4.5*  ALBUMIN 2.8* 2.3*   CBG: Recent Labs  Lab 05/31/23 1925 05/31/23 2138 05/31/23 2315 06/01/23 0332 06/01/23 1124  GLUCAP 114* 96 94 105* 115*    Discharge time spent: greater than 30 minutes.  This record has been created using Conservation officer, historic buildings. Errors have been sought and corrected,but may not always be located. Such creation errors do not reflect on the standard of care.   Signed: Arnetha Courser, MD [facility] 06/04/2023

## 2023-06-04 NOTE — Plan of Care (Addendum)
Patient is discharging to [facility]. Report called in to nurse [facility] RN. PIV removed. Family aware.  Problem: Education: Goal: Knowledge of the prescribed therapeutic regimen will improve Outcome: Adequate for Discharge   Problem: Coping: Goal: Ability to identify and develop effective coping behavior will improve Outcome: Adequate for Discharge   Problem: Clinical Measurements: Goal: Quality of life will improve Outcome: Adequate for Discharge   Problem: Respiratory: Goal: Verbalizations of increased ease of respirations will increase Outcome: Adequate for Discharge   Problem: Role Relationship: Goal: Family's ability to cope with current situation will improve Outcome: Adequate for Discharge Goal: Ability to verbalize concerns, feelings, and thoughts to partner or family member will improve Outcome: Adequate for Discharge   Problem: Pain Management: Goal: Satisfaction with pain management regimen will improve Outcome: Adequate for Discharge

## 2023-06-04 NOTE — Plan of Care (Signed)
  Problem: Education: Goal: Knowledge of General Education information will improve Description: Including pain rating scale, medication(s)/side effects and non-pharmacologic comfort measures Outcome: Not Applicable   Problem: Health Behavior/Discharge Planning: Goal: Ability to manage health-related needs will improve Outcome: Not Applicable   Problem: Clinical Measurements: Goal: Ability to maintain clinical measurements within normal limits will improve Outcome: Not Applicable Goal: Will remain free from infection Outcome: Not Applicable Goal: Diagnostic test results will improve Outcome: Not Applicable Goal: Respiratory complications will improve Outcome: Not Applicable Goal: Cardiovascular complication will be avoided Outcome: Not Applicable   Problem: Activity: Goal: Risk for activity intolerance will decrease Outcome: Not Applicable   Problem: Nutrition: Goal: Adequate nutrition will be maintained Outcome: Not Applicable   Problem: Coping: Goal: Level of anxiety will decrease Outcome: Not Applicable   Problem: Elimination: Goal: Will not experience complications related to bowel motility Outcome: Not Applicable Goal: Will not experience complications related to urinary retention Outcome: Not Applicable   Problem: Pain Managment: Goal: General experience of comfort will improve Outcome: Not Applicable   Problem: Safety: Goal: Ability to remain free from injury will improve Outcome: Not Applicable   Problem: Skin Integrity: Goal: Risk for impaired skin integrity will decrease Outcome: Not Applicable   Problem: Education: Goal: Ability to describe self-care measures that may prevent or decrease complications (Diabetes Survival Skills Education) will improve Outcome: Not Applicable Goal: Individualized Educational Video(s) Outcome: Not Applicable   Problem: Coping: Goal: Ability to adjust to condition or change in health will improve Outcome: Not  Applicable   Problem: Fluid Volume: Goal: Ability to maintain a balanced intake and output will improve Outcome: Not Applicable   Problem: Health Behavior/Discharge Planning: Goal: Ability to identify and utilize available resources and services will improve Outcome: Not Applicable Goal: Ability to manage health-related needs will improve Outcome: Not Applicable   Problem: Metabolic: Goal: Ability to maintain appropriate glucose levels will improve Outcome: Not Applicable   Problem: Nutritional: Goal: Maintenance of adequate nutrition will improve Outcome: Not Applicable Goal: Progress toward achieving an optimal weight will improve Outcome: Not Applicable   Problem: Skin Integrity: Goal: Risk for impaired skin integrity will decrease Outcome: Not Applicable   Problem: Tissue Perfusion: Goal: Adequacy of tissue perfusion will improve Outcome: Not Applicable   Problem: Education: Goal: Knowledge of the prescribed therapeutic regimen will improve Outcome: Not Applicable   Problem: Coping: Goal: Ability to identify and develop effective coping behavior will improve Outcome: Not Applicable   Problem: Clinical Measurements: Goal: Quality of life will improve Outcome: Not Applicable   Problem: Respiratory: Goal: Verbalizations of increased ease of respirations will increase Outcome: Not Applicable   Problem: Role Relationship: Goal: Family's ability to cope with current situation will improve Outcome: Not Applicable Goal: Ability to verbalize concerns, feelings, and thoughts to partner or family member will improve Outcome: Not Applicable   Problem: Pain Management: Goal: Satisfaction with pain management regimen will improve Outcome: Not Applicable

## 2023-06-04 NOTE — Consult Note (Addendum)
[facility] Customer service manager [facility]) Accountable Care Organization (ACO) [facility] Liaison Note  06/04/2023  KYION RAYOS Mar 29, 1930 [id number]  Location: [facility] RN Hospital Liaison screened the patient remotely at [facility].  Insurance: [facility] HMO   MARSHELL ROTENBERG is a 89 y.o. male who is a Primary Care Patient of Para March, Dwana Curd, MD [facility] at [facility]. The patient was screened for  day readmission hospitalization with noted extreme risk score for unplanned readmission risk with 3 IP/1 ED in 6 months.  The patient was assessed for potential [facility]) Care Management service needs for post hospital transition for care coordination. Review of patient's electronic medical record reveals patient was admitted for Acute metabolic encephalopathy.  Pt's needs will be managed by the facility for SNF level of care for STR.   [facility] Care Management/Population Health does not replace or interfere with any arrangements made by the Inpatient Transition of Care team.   For questions contact:   Elliot Cousin, RN, [facility] Liaison [place]   Population Health Office Hours MTWF  8:00 am-6:00 pm Off on Thursday [phone] mobile [phone] [Office toll free line] Office Hours are M-F 8:30 - 5 pm 24 hour nurse advise line [phone] Concierge  [email or web address]

## 2023-06-04 NOTE — TOC Transition Note (Signed)
Transition of Care [facility]) - CM/SW Discharge Note   Patient Details  Name: Charles Sims MRN: [id number] Date of Birth: 06-03-1930  Transition of Care [facility]) CM/SW Contact:  Allena Katz, LCSW Phone Number: 06/04/2023, 9:40 AM   Clinical Narrative:   Pt has orders to discharge to [facility]. Tiffany notified. Daughter notified. DC summary to be sent once in. Medical necessity printed to unit. Morrie Sheldon with [facility] notified.     Final next level of care: Skilled Nursing Facility Barriers to Discharge: Barriers Resolved   Patient Goals and CMS Choice CMS Medicare.gov Compare Post Acute Care list provided to:: Patient Represenative (must comment) (daughter) Choice offered to / list presented to : [facility] POA / Guardian  Discharge Placement                Patient chooses bed at: [facility] Patient to be transferred to facility by: [facility] Name of family member notified: [name] Patient and family notified of of transfer: 06/04/23  Discharge Plan and Services Additional resources added to the After Visit Summary for                                       Social Determinants of Health (SDOH) Interventions SDOH Screenings   Food Insecurity: No Food Insecurity (05/29/2023)  Housing: Low Risk  (05/28/2023)  Transportation Needs: No Transportation Needs (05/28/2023)  Utilities: Not At Risk (05/28/2023)  Depression (PHQ2-9): Low Risk  (05/14/2022)  Tobacco Use: Medium Risk (05/28/2023)     Readmission Risk Interventions    05/30/2023    8:37 AM  Readmission Risk Prevention Plan  Transportation Screening Complete  Medication Review (RN Care Manager) Referral to Pharmacy  PCP or Specialist appointment within 3-5 days of discharge Complete  HRI or Home Care Consult Complete  SW Recovery Care/Counseling Consult Complete  Palliative Care Screening Complete  Skilled Nursing Facility Complete

## 2023-06-04 NOTE — Plan of Care (Signed)
Problem: Education: Goal: Knowledge of General Education information will improve Description: Including pain rating scale, medication(s)/side effects and non-pharmacologic comfort measures Outcome: Not Applicable   Problem: Health Behavior/Discharge Planning: Goal: Ability to manage health-related needs will improve Outcome: Not Applicable   Problem: Clinical Measurements: Goal: Ability to maintain clinical measurements within normal limits will improve Outcome: Not Applicable Goal: Will remain free from infection Outcome: Not Applicable Goal: Diagnostic test results will improve Outcome: Not Applicable Goal: Respiratory complications will improve Outcome: Not Applicable Goal: Cardiovascular complication will be avoided Outcome: Not Applicable   Problem: Activity: Goal: Risk for activity intolerance will decrease Outcome: Not Applicable   Problem: Nutrition: Goal: Adequate nutrition will be maintained Outcome: Not Applicable   Problem: Coping: Goal: Level of anxiety will decrease Outcome: Not Applicable   Problem: Elimination: Goal: Will not experience complications related to bowel motility Outcome: Not Applicable Goal: Will not experience complications related to urinary retention Outcome: Not Applicable   Problem: Pain Managment: Goal: General experience of comfort will improve Outcome: Not Applicable   Problem: Safety: Goal: Ability to remain free from injury will improve Outcome: Not Applicable   Problem: Skin Integrity: Goal: Risk for impaired skin integrity will decrease Outcome: Not Applicable   Problem: Malnutrition  (NI-5.2) Goal: Food and/or nutrient delivery Description: Individualized approach for food/nutrient provision. Outcome: Not Applicable   Problem: Education: Goal: Ability to describe self-care measures that may prevent or decrease complications (Diabetes Survival Skills Education) will improve Outcome: Not Applicable Goal:  Individualized Educational Video(s) Outcome: Not Applicable   Problem: Coping: Goal: Ability to adjust to condition or change in health will improve Outcome: Not Applicable   Problem: Fluid Volume: Goal: Ability to maintain a balanced intake and output will improve Outcome: Not Applicable   Problem: Health Behavior/Discharge Planning: Goal: Ability to identify and utilize available resources and services will improve Outcome: Not Applicable Goal: Ability to manage health-related needs will improve Outcome: Not Applicable   Problem: Metabolic: Goal: Ability to maintain appropriate glucose levels will improve Outcome: Not Applicable   Problem: Nutritional: Goal: Maintenance of adequate nutrition will improve Outcome: Not Applicable Goal: Progress toward achieving an optimal weight will improve Outcome: Not Applicable   Problem: Skin Integrity: Goal: Risk for impaired skin integrity will decrease Outcome: Not Applicable   Problem: Tissue Perfusion: Goal: Adequacy of tissue perfusion will improve Outcome: Not Applicable   Problem: Acute Rehab OT Goals (only OT should resolve) Goal: Pt. Will Perform Eating Outcome: Not Applicable Goal: Pt. Will Perform Grooming Outcome: Not Applicable Goal: Pt. Will Transfer To Toilet Outcome: Not Applicable   Problem: Acute Rehab PT Goals(only PT should resolve) Goal: Pt Will Go Supine/Side To Sit Outcome: Not Applicable Goal: Patient Will Transfer Sit To/From Stand Outcome: Not Applicable Goal: Pt Will Transfer Bed To Chair/Chair To Bed Outcome: Not Applicable   Problem: SLP Dysphagia Goals Goal: Misc Dysphagia Goal Outcome: Not Applicable   Problem: Education: Goal: Knowledge of the prescribed therapeutic regimen will improve Outcome: Not Applicable   Problem: Coping: Goal: Ability to identify and develop effective coping behavior will improve Outcome: Not Applicable   Problem: Clinical Measurements: Goal: Quality of life  will improve Outcome: Not Applicable   Problem: Respiratory: Goal: Verbalizations of increased ease of respirations will increase Outcome: Not Applicable   Problem: Role Relationship: Goal: Family's ability to cope with current situation will improve Outcome: Not Applicable Goal: Ability to verbalize concerns, feelings, and thoughts to partner or family member will improve Outcome: Not Applicable  Problem: Pain Management: Goal: Satisfaction with pain management regimen will improve Outcome: Not Applicable

## 2023-06-04 NOTE — Plan of Care (Signed)
  Problem: Education: Goal: Knowledge of General Education information will improve Description: Including pain rating scale, medication(s)/side effects and non-pharmacologic comfort measures Outcome: Progressing   Problem: Clinical Measurements: Goal: Ability to maintain clinical measurements within normal limits will improve Outcome: Progressing Goal: Cardiovascular complication will be avoided Outcome: Progressing   Problem: Nutrition: Goal: Adequate nutrition will be maintained Outcome: Progressing   Problem: Pain Managment: Goal: General experience of comfort will improve Outcome: Progressing

## 2023-07-07 DEATH — deceased
